# Patient Record
Sex: Male | Born: 1940 | Race: White | Hispanic: No | Marital: Married | Smoking: Current some day smoker
Health system: Southern US, Community
[De-identification: ages and names within clinical notes are randomized; demographics above are authoritative.]

## PROBLEM LIST (undated history)

## (undated) DIAGNOSIS — Z720 Tobacco use: Secondary | ICD-10-CM

## (undated) DIAGNOSIS — I2510 Atherosclerotic heart disease of native coronary artery without angina pectoris: Secondary | ICD-10-CM

## (undated) DIAGNOSIS — I214 Non-ST elevation (NSTEMI) myocardial infarction: Secondary | ICD-10-CM

## (undated) DIAGNOSIS — Z91041 Radiographic dye allergy status: Secondary | ICD-10-CM

## (undated) DIAGNOSIS — I4892 Unspecified atrial flutter: Secondary | ICD-10-CM

## (undated) DIAGNOSIS — IMO0002 Reserved for concepts with insufficient information to code with codable children: Secondary | ICD-10-CM

## (undated) DIAGNOSIS — I739 Peripheral vascular disease, unspecified: Secondary | ICD-10-CM

## (undated) DIAGNOSIS — I4891 Unspecified atrial fibrillation: Secondary | ICD-10-CM

## (undated) DIAGNOSIS — M1990 Unspecified osteoarthritis, unspecified site: Secondary | ICD-10-CM

## (undated) DIAGNOSIS — N183 Chronic kidney disease, stage 3 unspecified: Secondary | ICD-10-CM

## (undated) DIAGNOSIS — E785 Hyperlipidemia, unspecified: Secondary | ICD-10-CM

## (undated) DIAGNOSIS — I6529 Occlusion and stenosis of unspecified carotid artery: Secondary | ICD-10-CM

## (undated) DIAGNOSIS — R001 Bradycardia, unspecified: Secondary | ICD-10-CM

## (undated) DIAGNOSIS — R0789 Other chest pain: Secondary | ICD-10-CM

## (undated) DIAGNOSIS — I4510 Unspecified right bundle-branch block: Secondary | ICD-10-CM

## (undated) DIAGNOSIS — I10 Essential (primary) hypertension: Secondary | ICD-10-CM

## (undated) DIAGNOSIS — K219 Gastro-esophageal reflux disease without esophagitis: Secondary | ICD-10-CM

## (undated) HISTORY — DX: Hyperlipidemia, unspecified: E78.5

## (undated) HISTORY — DX: Other chest pain: R07.89

## (undated) HISTORY — DX: Unspecified osteoarthritis, unspecified site: M19.90

## (undated) HISTORY — DX: Occlusion and stenosis of unspecified carotid artery: I65.29

## (undated) HISTORY — PX: BACK SURGERY: SHX140

## (undated) HISTORY — DX: Gastro-esophageal reflux disease without esophagitis: K21.9

## (undated) HISTORY — PX: ARTERIAL BYPASS SURGRY: SHX557

## (undated) HISTORY — PX: APPENDECTOMY: SHX54

## (undated) HISTORY — PX: LAPAROSCOPIC CHOLECYSTECTOMY: SUR755

## (undated) HISTORY — PX: LUMBAR DISC SURGERY: SHX700

## (undated) HISTORY — PX: TONSILLECTOMY: SUR1361

## (undated) HISTORY — DX: Peripheral vascular disease, unspecified: I73.9

## (undated) HISTORY — DX: Unspecified right bundle-branch block: I45.10

## (undated) HISTORY — DX: Essential (primary) hypertension: I10

---

## 1999-04-22 ENCOUNTER — Inpatient Hospital Stay (HOSPITAL_COMMUNITY): Admission: EM | Admit: 1999-04-22 | Discharge: 1999-04-28 | Payer: Self-pay | Admitting: Emergency Medicine

## 1999-04-22 ENCOUNTER — Encounter (INDEPENDENT_AMBULATORY_CARE_PROVIDER_SITE_OTHER): Payer: Self-pay | Admitting: *Deleted

## 1999-04-22 ENCOUNTER — Encounter: Payer: Self-pay | Admitting: Emergency Medicine

## 1999-07-07 ENCOUNTER — Emergency Department (HOSPITAL_COMMUNITY): Admission: EM | Admit: 1999-07-07 | Discharge: 1999-07-07 | Payer: Self-pay | Admitting: Emergency Medicine

## 1999-07-07 ENCOUNTER — Encounter: Payer: Self-pay | Admitting: Emergency Medicine

## 1999-07-21 ENCOUNTER — Ambulatory Visit (HOSPITAL_COMMUNITY): Admission: RE | Admit: 1999-07-21 | Discharge: 1999-07-22 | Payer: Self-pay | Admitting: General Surgery

## 1999-07-21 ENCOUNTER — Encounter: Payer: Self-pay | Admitting: General Surgery

## 1999-07-21 ENCOUNTER — Encounter (INDEPENDENT_AMBULATORY_CARE_PROVIDER_SITE_OTHER): Payer: Self-pay | Admitting: *Deleted

## 2000-10-31 ENCOUNTER — Emergency Department (HOSPITAL_COMMUNITY): Admission: EM | Admit: 2000-10-31 | Discharge: 2000-10-31 | Payer: Self-pay

## 2001-08-07 ENCOUNTER — Emergency Department (HOSPITAL_COMMUNITY): Admission: EM | Admit: 2001-08-07 | Discharge: 2001-08-08 | Payer: Self-pay | Admitting: Emergency Medicine

## 2004-03-13 ENCOUNTER — Ambulatory Visit: Payer: Self-pay | Admitting: Family Medicine

## 2004-05-13 ENCOUNTER — Ambulatory Visit: Payer: Self-pay | Admitting: Family Medicine

## 2004-05-22 ENCOUNTER — Ambulatory Visit (HOSPITAL_COMMUNITY): Admission: RE | Admit: 2004-05-22 | Discharge: 2004-05-22 | Payer: Self-pay | Admitting: Family Medicine

## 2004-05-22 ENCOUNTER — Ambulatory Visit: Payer: Self-pay | Admitting: Family Medicine

## 2004-05-29 ENCOUNTER — Ambulatory Visit: Payer: Self-pay | Admitting: Family Medicine

## 2004-08-07 ENCOUNTER — Ambulatory Visit: Payer: Self-pay | Admitting: Family Medicine

## 2004-08-28 ENCOUNTER — Ambulatory Visit: Payer: Self-pay | Admitting: Family Medicine

## 2004-09-11 ENCOUNTER — Ambulatory Visit: Payer: Self-pay | Admitting: Pulmonary Disease

## 2004-09-17 ENCOUNTER — Ambulatory Visit: Admission: RE | Admit: 2004-09-17 | Discharge: 2004-09-17 | Payer: Self-pay | Admitting: Pulmonary Disease

## 2004-09-17 ENCOUNTER — Ambulatory Visit: Payer: Self-pay | Admitting: Pulmonary Disease

## 2004-10-02 ENCOUNTER — Ambulatory Visit: Payer: Self-pay

## 2004-10-09 ENCOUNTER — Ambulatory Visit: Payer: Self-pay | Admitting: Family Medicine

## 2005-02-10 ENCOUNTER — Ambulatory Visit: Payer: Self-pay | Admitting: Family Medicine

## 2005-06-11 ENCOUNTER — Ambulatory Visit: Payer: Self-pay | Admitting: Family Medicine

## 2006-03-22 ENCOUNTER — Ambulatory Visit: Payer: Self-pay | Admitting: Family Medicine

## 2006-03-30 ENCOUNTER — Ambulatory Visit: Payer: Self-pay | Admitting: Physician Assistant

## 2006-06-04 ENCOUNTER — Ambulatory Visit: Payer: Self-pay | Admitting: Family Medicine

## 2007-11-02 ENCOUNTER — Encounter: Admission: RE | Admit: 2007-11-02 | Discharge: 2007-11-02 | Payer: Self-pay | Admitting: Cardiology

## 2007-11-29 ENCOUNTER — Ambulatory Visit (HOSPITAL_COMMUNITY): Admission: RE | Admit: 2007-11-29 | Discharge: 2007-11-29 | Payer: Self-pay | Admitting: Cardiology

## 2007-12-08 ENCOUNTER — Ambulatory Visit: Payer: Self-pay | Admitting: Vascular Surgery

## 2007-12-13 ENCOUNTER — Encounter: Payer: Self-pay | Admitting: Vascular Surgery

## 2007-12-13 ENCOUNTER — Ambulatory Visit: Payer: Self-pay | Admitting: Vascular Surgery

## 2007-12-13 ENCOUNTER — Inpatient Hospital Stay (HOSPITAL_COMMUNITY): Admission: RE | Admit: 2007-12-13 | Discharge: 2007-12-14 | Payer: Self-pay | Admitting: Vascular Surgery

## 2007-12-13 HISTORY — PX: CAROTID ENDARTERECTOMY: SUR193

## 2008-01-04 ENCOUNTER — Ambulatory Visit: Payer: Self-pay | Admitting: Vascular Surgery

## 2008-07-04 ENCOUNTER — Ambulatory Visit: Payer: Self-pay | Admitting: Vascular Surgery

## 2008-11-20 IMAGING — CR DG CHEST 2V
2 series · 2 of 2 positions shown · non-contrast
Comparison: 11/02/2007 study.

CLINICAL DATA: History given of tobacco smoking.  Preoperative
respiratory evaluation.

CHEST - 2 VIEW

[view not recorded (1 of 2)]
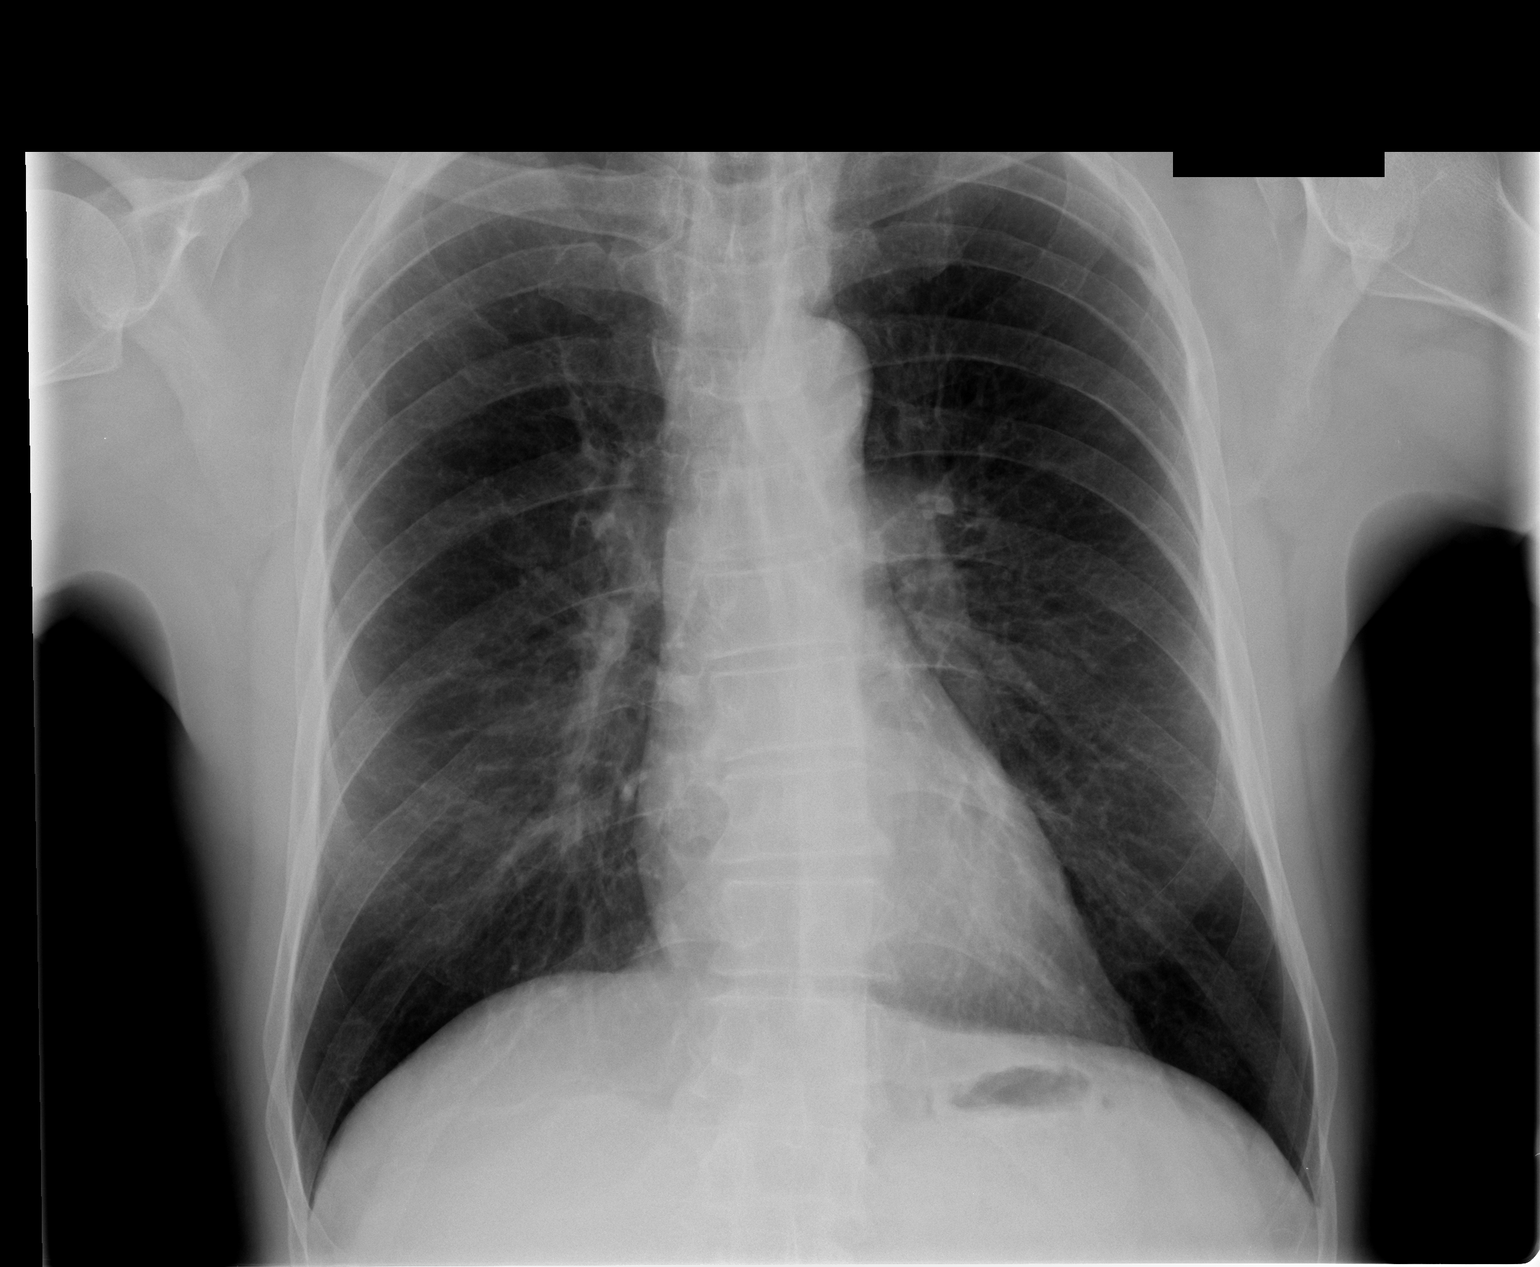

[view not recorded (2 of 2)]
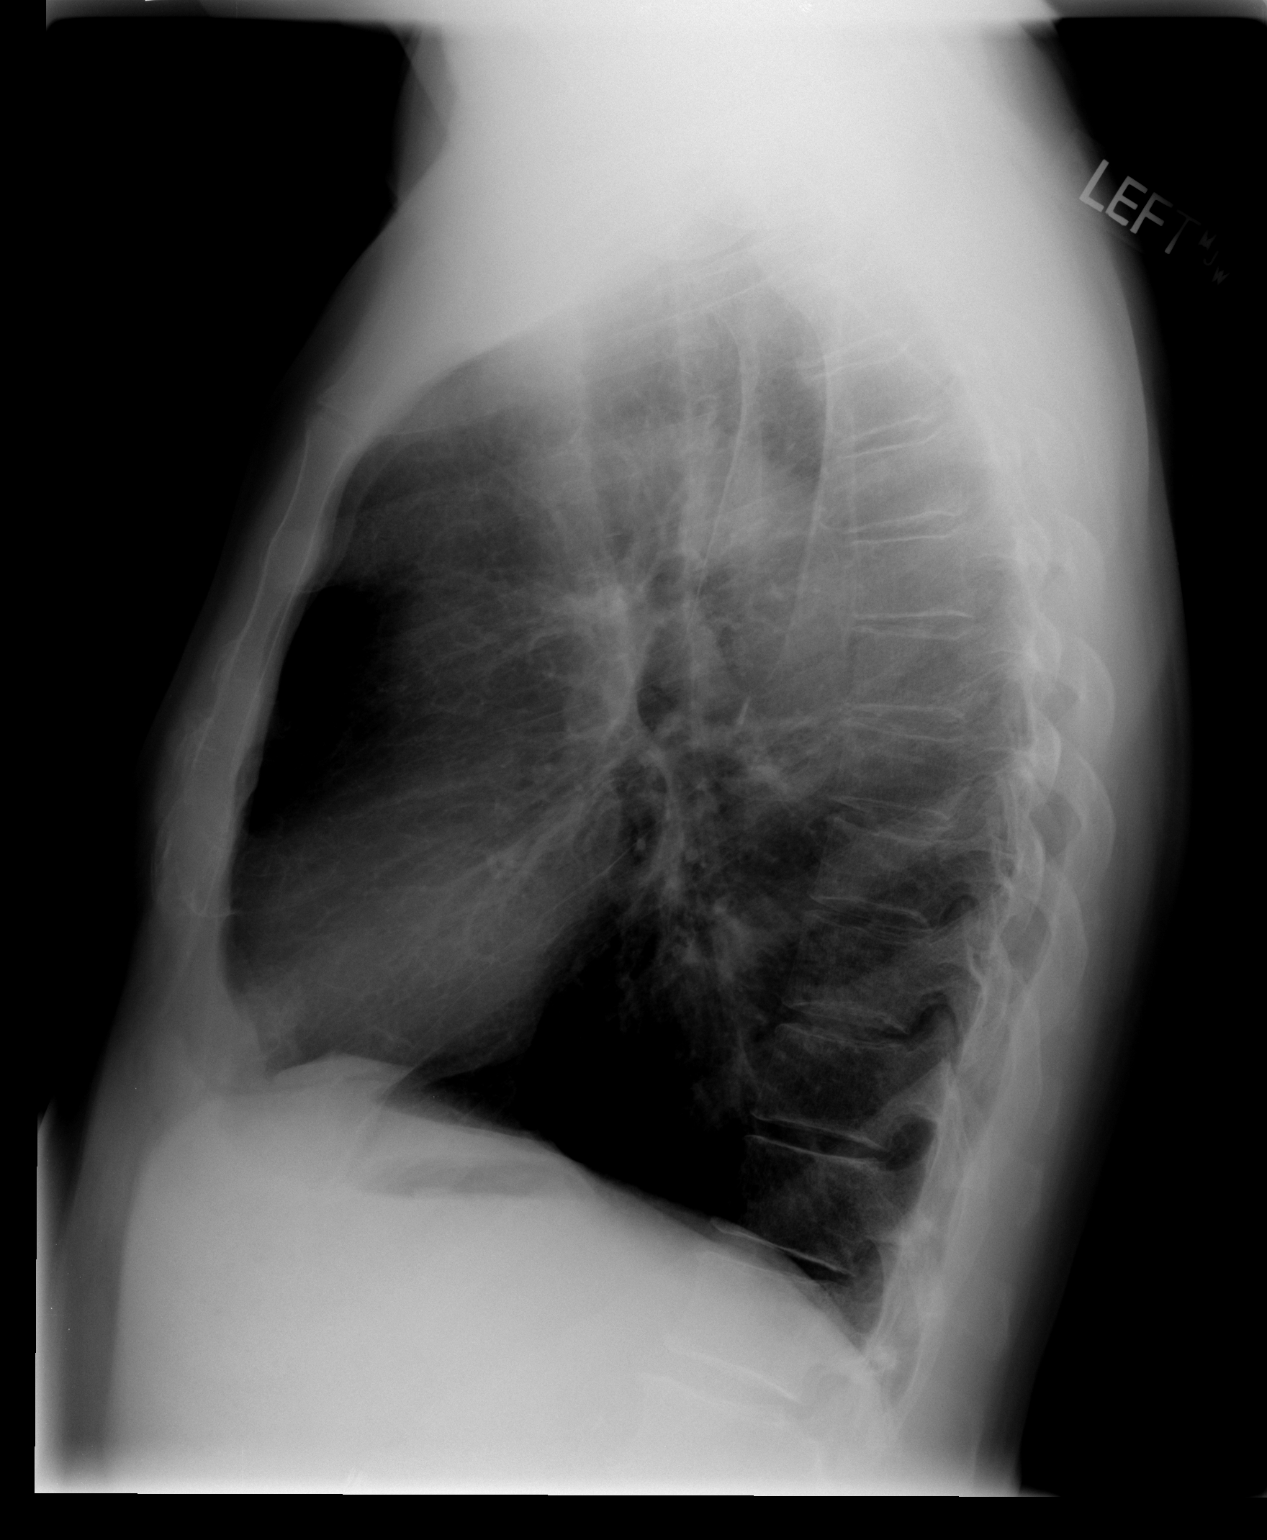

[2 of 2 positions shown; findings below may reference images not displayed]

FINDINGS: Cardiac silhouette is normal size shape.  No pleural
effusion is seen.  Mediastinum and bones appear normal.

Generalized hyperinflation configuration is seen consistent with
obstructive pulmonary disease.
IMPRESSION: Chronic hyperinflation consistent with obstructive pulmonary
disease.  No acute process seen.

## 2009-01-02 ENCOUNTER — Ambulatory Visit: Payer: Self-pay | Admitting: Vascular Surgery

## 2009-02-25 DIAGNOSIS — R0789 Other chest pain: Secondary | ICD-10-CM

## 2009-02-25 HISTORY — DX: Other chest pain: R07.89

## 2009-06-26 ENCOUNTER — Ambulatory Visit: Payer: Self-pay | Admitting: Vascular Surgery

## 2009-07-15 ENCOUNTER — Inpatient Hospital Stay (HOSPITAL_COMMUNITY): Admission: RE | Admit: 2009-07-15 | Discharge: 2009-07-16 | Payer: Self-pay | Admitting: Vascular Surgery

## 2009-07-15 ENCOUNTER — Ambulatory Visit: Payer: Self-pay | Admitting: Vascular Surgery

## 2009-07-15 ENCOUNTER — Encounter: Payer: Self-pay | Admitting: Vascular Surgery

## 2009-07-15 HISTORY — PX: CAROTID ENDARTERECTOMY: SUR193

## 2009-08-07 ENCOUNTER — Ambulatory Visit: Payer: Self-pay | Admitting: Vascular Surgery

## 2010-02-13 DIAGNOSIS — I10 Essential (primary) hypertension: Secondary | ICD-10-CM

## 2010-02-13 DIAGNOSIS — I4510 Unspecified right bundle-branch block: Secondary | ICD-10-CM

## 2010-02-13 HISTORY — DX: Essential (primary) hypertension: I10

## 2010-02-13 HISTORY — DX: Unspecified right bundle-branch block: I45.10

## 2010-04-11 ENCOUNTER — Ambulatory Visit: Payer: Self-pay | Admitting: Vascular Surgery

## 2010-07-02 LAB — BASIC METABOLIC PANEL
CO2: 22 mEq/L (ref 19–32)
Calcium: 8.3 mg/dL — ABNORMAL LOW (ref 8.4–10.5)
Creatinine, Ser: 1.58 mg/dL — ABNORMAL HIGH (ref 0.4–1.5)
GFR calc Af Amer: 53 mL/min — ABNORMAL LOW (ref >60)
GFR calc non Af Amer: 44 mL/min — ABNORMAL LOW (ref >60)
Glucose, Bld: 174 mg/dL — ABNORMAL HIGH (ref 70–99)
Sodium: 132 mEq/L — ABNORMAL LOW (ref 135–145)

## 2010-07-02 LAB — CBC
Hemoglobin: 12.8 g/dL — ABNORMAL LOW (ref 13.0–17.0)
MCHC: 34.6 g/dL (ref 30.0–36.0)
RBC: 3.88 MIL/uL — ABNORMAL LOW (ref 4.22–5.81)
RDW: 13.2 % (ref 11.5–15.5)

## 2010-07-07 LAB — CBC
Hemoglobin: 14.7 g/dL (ref 13.0–17.0)
RBC: 4.48 MIL/uL (ref 4.22–5.81)
WBC: 8.2 10*3/uL (ref 4.0–10.5)

## 2010-07-07 LAB — URINALYSIS, ROUTINE W REFLEX MICROSCOPIC
Bilirubin Urine: NEGATIVE
Glucose, UA: NEGATIVE mg/dL
Hgb urine dipstick: NEGATIVE
Ketones, ur: NEGATIVE mg/dL
pH: 5.5 (ref 5.0–8.0)

## 2010-07-07 LAB — COMPREHENSIVE METABOLIC PANEL
ALT: 19 U/L (ref 0–53)
AST: 22 U/L (ref 0–37)
Alkaline Phosphatase: 48 U/L (ref 39–117)
CO2: 24 mEq/L (ref 19–32)
Chloride: 110 mEq/L (ref 96–112)
GFR calc Af Amer: 54 mL/min — ABNORMAL LOW (ref >60)
GFR calc non Af Amer: 44 mL/min — ABNORMAL LOW (ref >60)
Glucose, Bld: 87 mg/dL (ref 70–99)
Sodium: 139 mEq/L (ref 135–145)
Total Bilirubin: 0.8 mg/dL (ref 0.3–1.2)

## 2010-07-07 LAB — TYPE AND SCREEN

## 2010-07-07 LAB — PROTIME-INR: Prothrombin Time: 13.5 seconds (ref 11.6–15.2)

## 2010-08-26 NOTE — Op Note (Signed)
NAME:  Ralph Dawson, Ralph Dawson NO.:  [id number]   MEDICAL RECORD NO.:  [id number]          PATIENT TYPE:  INP   LOCATION:  3312                         FACILITY:  [facility]   PHYSICIAN:  Janetta Hora. Fields, MD  DATE OF BIRTH:  1941-03-07   DATE OF PROCEDURE:  12/13/2007  DATE OF DISCHARGE:                               OPERATIVE REPORT   PROCEDURE:  Left carotid endarterectomy.   PREOPERATIVE DIAGNOSIS:  High-grade left internal carotid artery  stenosis.   POSTOPERATIVE DIAGNOSIS:  High-grade left internal carotid artery  stenosis.   ANESTHESIA:  General.   ASSISTANT:  Jerold Coombe, PA-C   OPERATIVE FINDINGS:  1. High-grade greater than 80% left internal carotid artery stenosis.  2. A 10-French shunt.  3. Dacron patch.   OPERATIVE DETAILS:  After obtaining informed consent, the patient was  taken to the operating.  The patient was placed in supine position on  the operating table.  After induction of general anesthesia and  endotracheal intubation, the patient's entire left neck and chest were  prepped and draped in usual sterile fashion.  An oblique incision was  made on the left side of the neck just anterior to the border of the  left sternocleidomastoid muscle.  Incision was carried down along the  anterior border of the sternocleidomastoid muscle and the internal  jugular vein was identified.  Common facial vein was dissected free  circumferentially, ligated and divided between silk ties.  Common  carotid artery was dissected free circumferentially and an umbilical  tape placed around this.  Vagus nerve was identified and protected.  Ansa cervicalis was identified and this was traced up to the level of  the hypoglossal nerve.  This was also identified and protected.  External carotid artery and superior thyroid artery were dissected free  circumferentially and vessel loops were placed around these.  The distal  internal carotid artery was dissected free  circumferentially above the  level of externally apparent disease.  A vessel loop was also placed  around this.  The patient was then given 7000 units of intravenous  heparin.  The patient was also given an additional bolus of 2000 units  of heparin during the case.  After appropriate circulation time, the  distal internal carotid artery was controlled with a vessel loop and the  common carotid artery controlled with peripheral DeBakey clamp, external  carotid artery was controlled with a vessel loop.  Longitudinal opening  was made in the common carotid artery just below the bifurcation.  This  arteriotomy was extended up through the carotid bifurcation and into the  internal carotid artery.  There was a high-grade greater than 80%  calcified plaque in this area.  A 10-French shunt was brought up in the  operative field.  This was then threaded into the distal internal  carotid artery and allowed to back bleed thoroughly.  It was then  threaded down into the common carotid artery and secured with a Rummel  tourniquet.  It was inspected and found to be free of air and the shunt  was opened with restoration of  flow to the brain after approximately 4  minutes time.  Next, endarterectomy was begun in a suitable plane  adjacent to the carotid bifurcation.  The external carotid artery was  endarterectomized by eversion technique.  Internal carotid artery  endpoint was slightly shelf-like in appearance, so this was tacked  posteriorly with several 7-0 Prolene sutures.  The remainder of all  loose debris was removed from the carotid artery.  A Dacron patch was  then brought up into the operative field and this was sewn on as a patch  angioplasty using running 6-0 Prolene suture.  Just prior to completion  of the anastomosis, the shunt was reoccluded and the distal internal  carotid artery was controlled with a vessel loop.  The distal internal  carotid artery shunt was removed and this was  allowed to back bleed  thoroughly.  This was then reoccluded.  The shunt was then removed from  the common carotid artery.  This was forebled thoroughly and then  reoccluded with a peripheral DeBakey clamp.  The external carotid artery  was thoroughly backbled and then the entire carotid was thoroughly  flushed with heparinized saline.  The remainder of the patch was  completed.  Flow was first restored by opening the external carotid  artery followed by common carotid artery and after approximately 5  cardiac cycles to the internal carotid artery.  Doppler was used to  evaluate the carotid artery and there was good flow through the  external, common, and internal carotid artery.  Next, hemostasis was  obtained with an additional repair stitch at the distal end of the  patch.  The patient was also given 50 mg of protamine.  Next, the  platysma muscle was reapproximated using running 3-0 Vicryl suture.  Skin was closed with 4-0 Vicryl subcuticular stitch.  The patient  tolerated the procedure well and there were no complications.  Instrument, sponge, and needle counts were correct at the end of the  case.  The patient was taken to the recovery room in stable condition  moving upper extremity and lower extremities symmetrically, and motor  strength.      Janetta Hora. Fields, MD  Electronically Signed     CEF/MEDQ  D:  12/13/2007  T:  12/14/2007  Job:  [id number]   cc:   Cristy Hilts. Jacinto Halim, MD  Delaney Meigs, M.D.

## 2010-08-26 NOTE — Assessment & Plan Note (Signed)
OFFICE VISIT   Ralph Dawson, Ralph Dawson  DOB:  [birth year]                                       01/02/2009  WGNFA#:[id number]   The patient is a 70 year old male who previously underwent left carotid  endarterectomy in September 2009.  He presents today for further follow-  up.  He states that he still has numbness surrounding the incision on  the left side of his neck.  He also complains that he gets occasional  dizziness, especially when standing up quickly.  He has also had some  issues with balance recently.   He denies any symptoms of TIA, amaurosis or stroke.  His primary  atherosclerotic risk factor remains tobacco abuse and he still continues  to smoke 1/2 pack daily.  Several minutes were spent discussing smoking  cessation and the benefits to this with him today.   Current medications include aspirin 325 mg once a day, Protonix 40 mg  once a day, Zocor 20 mg once a day, omega fish oil 1000 mg 2 tablets  daily.   On physical exam today, blood pressure is 138/72 in the right arm,  119/74 in the left arm, pulse is 64 regular.  HEENT is unremarkable.  Neck has 2+ carotid pulses without bruit.  Chest:  Clear to  auscultation.  Cardiac exam is regular rate and rhythm without murmur.  He has 2+ femoral pulses, 2+ radial pulses bilaterally.  Neurologic exam  shows symmetric upper extremity and lower extremity motor strength,  which is 5/5.  He has no pronator drift.   He had a carotid duplex exam today which showed a stable right-sided 60-  80% stenosis.  He had no evidence of restenosis of his left carotid  endarterectomy site.   Overall the patient is doing well.  Some of his balance and dizzy  symptoms are probably more postural-related after discussion with him.  I do not believe these are related to carotid occlusive disease.  He  does have a moderate carotid stenosis on the right side, which would  need continued surveillance.  He will continue to take  his aspirin.  He  will have a follow-up carotid duplex exam in 6 months' time.  He will  return sooner if he develops any type of focal neurologic deficits.   Janetta Hora. Fields, MD  Electronically Signed   CEF/MEDQ  D:  01/02/2009  T:  01/03/2009  Job:  [id number]   cc:   [facility]  Delaney Meigs, M.D.

## 2010-08-26 NOTE — Procedures (Signed)
CAROTID DUPLEX EXAM   INDICATION:  Followup of known carotid artery disease.   HISTORY:  Diabetes:  No.  Cardiac:  No.  Hypertension:  No.  Smoking:  Yes.  Previous Surgery:  Left carotid endarterectomy with Dacron patch  angioplasty on 12/13/2007 by Dr. Darrick Penna.  CV History:  No.  Amaurosis Fugax No, Paresthesias No, Hemiparesis No                                       RIGHT             LEFT  Brachial systolic pressure:         126               130  Brachial Doppler waveforms:         WNL               WNL  Vertebral direction of flow:        Antegrade         Antegrade  DUPLEX VELOCITIES (cm/sec)  CCA peak systolic                   126               143  ECA peak systolic                   392               189  ICA peak systolic                   365               114  ICA end diastolic                   87                38  PLAQUE MORPHOLOGY:                  Homogeneous soft  None  PLAQUE AMOUNT:                      Moderate to severe                  None  PLAQUE LOCATION:                    Bif/ICA/ECA       None   IMPRESSION:  1. High end of 60-79% right internal carotid artery stenosis.  2. Patent left internal carotid artery with no evidence of restenosis.   ___________________________________________  Janetta Hora Fields, MD   AC/MEDQ  D:  07/04/2008  T:  07/04/2008  Job:  [id number]

## 2010-08-26 NOTE — Assessment & Plan Note (Signed)
OFFICE VISIT   Ralph Dawson, Ralph Dawson  DOB:  02/18/1941                                       12/08/2007  FAOZH#:[id number]   The patient is a 70 year old male referred by Dr. Jacinto Halim for evaluation  of a high-grade internal carotid artery stenosis.  The patient had an  episode of chest pain and dizziness in May of 2009.  At that time a  stress test was performed which showed no ischemia.  He has no prior  history of stroke or TIA or amaurosis.  Further evaluation by Dr. Jacinto Halim,  including carotid arteriogram, was performed and this showed a greater  than 80% left internal carotid artery stenosis and a 60% right internal  carotid artery stenosis, left and right vertebral arteries were patent.  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  He currently denies any  claudication symptoms.   His atherosclerotic risk factors include tobacco, he currently smokes a  pack a day, he has elevated cholesterol.  He denies history of diabetes  or hypertension.  He does not have any symptoms of TIA, amaurosis or  stroke since that episode in May.   PAST MEDICAL HISTORY:  Is otherwise fairly unremarkable.   FAMILY HISTORY:  Unremarkable.   SOCIAL HISTORY:  He is married, has 4 children.   SMOKING HISTORY:  Is as above.   ALCOHOL HISTORY:  He does not drink.   REVIEW OF SYSTEMS:  He is 5 feet 8 inches, 158 pounds.  CARDIAC:  He has occasional chest tightness.  GI:  He has a history of reflux.  ORTHOPEDIC:  He has multiple joint arthritis.  PULMONARY/RENAL/VASCULAR/NEUROLOGIC/PSYCHIATRIC/ENT/HEMATOLOGIC REVIEW  OF SYSTEMS:  Are all negative.   PAST SURGICAL HISTORY:  He had a cholecystectomy and an appendectomy.   MEDICATIONS:  Aspirin 325 mg once a day, Protonix 40 mg once a day,  Zocor 20 mg once a day, Trilipix 135 mg once a day, Omega 3  capsule 1  daily.   He has no known drug allergies.   PHYSICAL EXAMINATION:  Blood pressure is 133/76 in the left arm, 138/79  in the right arm, pulse is 66 and regular.  HEENT:  Unremarkable.  He  has 2+ carotid pulses without bruit bilaterally.  Chest:  Clear to  auscultation.  Cardiac Exam:  Regular rate and rhythm without murmur.  Abdomen:  Soft, nontender, nondistended with no pulsatile mass.  Extremities:  He has 2+ radial, femoral, dorsalis pedis and posterior  tibial pulses bilaterally.  Feet are pink, warm, and well-perfused.  Neurologic Exam:  Shows symmetric upper extremity and lower extremity  motor strength.  Cranial nerves II-XII are grossly intact.   I reviewed his carotid arteriogram dated 11/28/2000.  This shows a high-  grade left internal carotid artery stenosis of greater than 80% with a  60% right internal carotid artery stenosis, the bifurcation is fairly  normal location anatomically and there is normal artery distal to the  stenosis.  The stenosis overall is fairly focal.   SUMMARY:  The patient has a high-grade left internal carotid artery  stenosis.  I believe he would benefit from left carotid endarterectomy  for stroke prophylaxis.  Currently, the lesion is asymptomatic.  I  counseled him today to continue his aspirin, I also again counseled him  to quit smoking.  We have scheduled his carotid endarterectomy for  Tuesday, September 1st.  Risks, benefits, possible complications and  procedure details were explained to the patient including, but not  limited to, bleeding, infection, stroke risk of less than 2%, cranial  nerve injury of 5-10%.  He understands and agrees to proceed.   Janetta Hora. Fields, MD  Electronically Signed   CEF/MEDQ  D:  12/09/2007  T:  12/09/2007  Job:  [id number]   cc:   Cristy Hilts. Jacinto Halim, MD  Delaney Meigs, M.D.

## 2010-08-26 NOTE — Cardiovascular Report (Signed)
NAME:  Ralph Dawson, Ralph Dawson NO.:  [id number]   MEDICAL RECORD NO.:  [id number]          PATIENT TYPE:  AMB   LOCATION:  SDS                            FACILITY:   PHYSICIAN:  Vonna Kotyk R. Jacinto Halim, MD       DATE OF BIRTH:  04/06/1941   DATE OF PROCEDURE:  11/29/2007  DATE OF DISCHARGE:                            CARDIAC CATHETERIZATION   REFERRING PHYSICIAN:  Delaney Meigs, MD.   PROCEDURE PERFORMED:  1. Arch aortogram.  2. Selective four-vessel cerebral arteriography including intracranial      and extracranial.  3. Abdominal aortogram.  4. Abdominal aortogram bifemoral runoff.   PRIMARY OPERATOR:  Cristy Hilts. Jacinto Halim, MD   ASSISTANT:  Nanetta Batty, MD   INDICATIONS:  Ralph Dawson is a 70 year old gentleman with history  of left arm tingling and numbness that occurred about a month ago, had  been complaining of chest discomfort at that time.  His cardiac risk  factors include smoking.  He also has mixed hyperlipidemia.  He had  undergone outpatient lower extremity and carotid Dopplers and this had  revealed high-grade stenosis of the left internal carotid artery about  90% with a peak velocity of 406/171 cm/sec.  Given this, he was brought  to the peripheral angiography suite to confirm the degree of stenosis.  Abdominal aortogram and bifemoral runoff were also performed to evaluate  for abdominal atherosclerosis and peripheral arterial disease with  suspicion for bilateral superficial femoral artery stenosis and right  common iliac artery stenosis.   Arch aortogram:  Arch aortogram revealed type 2 arch.  The cerebral  vessels arose in a normal fashion.   Right vertebral artery:  The right vertebral artery is very small and  nondominant.  It is normal.   Right carotid artery:  Right carotid artery showed a focal 60% stenosis  just at the bulb of the right common carotid artery.  The stenosis did  not involve either the internal or external carotid artery.   The right external and internal carotid artery were widely patent.   The intracranial portion of the right internal carotid artery and the  intracranial vessels were normal without any aneurysmal dilatation or  any significant atherosclerotic changes.   Left carotid artery:  Left common carotid artery was smooth and normal.  The left internal carotid artery showed a focal 80-90% stenosis.  Left  external iliac artery was patent and normal.   The intracranial left carotid arterial system was normal without any  significant atherosclerotic changes or aneurysmal dilatation.   Left vertebral artery:  The left vertebral artery showed mildly diffuse  luminal irregularity about 10-20%.  Otherwise, it is smooth and normal  at the base and also intracranial vasculature was within normal limits.  This was a dominant vessel and filled the contralateral vertebral.   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  The right renal artery showed a 30-40%  stenosis.  The infrarenal abdominal aorta showed mild-to-moderate amount  of atherosclerotic changes.  Distal abdominal aorta also showed mild  atherosclerotic  change.   Bilateral iliac arteriogram with distal runoff:  The distal abdominal  aortogram with bifemoral runoff revealed the right common iliac artery  to show a 40% luminal irregularity.  Otherwise, the right lower  extremity runoff was within normal limits with mild disease up to the  pelvis.   The right superficial femoral artery showed a focal 80% stenosis and  mild diffuse luminal irregularity with a three-vessel runoff below the  right knee.   On the left side, the left common iliac artery showed a 30% stenosis.  The left superficial femoral artery showed mild diffuse luminal  irregularity.  Below the left knee, there was a three-vessel runoff.   IMPRESSION:  1. A 60% stenosis in the right common carotid artery at the  bulb.  2. High-grade 80-90% stenosis of the left internal carotid artery at      its origin.  3. Mild luminal irregularity of the left vertebral artery, which is      dominant.  4. Moderate amount of abdominal atherosclerosis infrarenally with a 30-      40% right renal artery stenosis.  5. A 30-40% stenosis of the common iliac artery stenosis.  6. An 80% right superficial femoral artery stenosis, asymptomatic and      a 50-60% left superficial femoral artery stenosis in the mid      segment with three-vessel runoff below the knee bilaterally.   RECOMMENDATIONS:  We will continue with aggressive risk modification for  his peripheral arterial disease.  He will need left carotid  endarterectomy given high-grade nature of his internal carotid artery  stenosis for primary prevention of stroke.   TECHNIQUE OF THE PROCEDURE:  Under usual sterile precaution using a 5-  French right femoral arterial access, a 5-French pigtail catheter were  advanced into the arch of the aorta and arch aortogram was performed.  Then using JB1 5-French catheter, selective four-vessel cerebral  angiography was performed.  The catheter was then pulled out of the body  and a pigtail catheter was re-advanced back into the abdominal aorta and  abdominal aortogram was performed.  Pelvic aortogram with bifemoral  runoff was also performed.  The catheter was then pulled out of the  body.  The patient tolerated the procedure well.  No immediate  complications.      Cristy Hilts. Jacinto Halim, MD  Electronically Signed     JRG/MEDQ  D:  11/29/2007  T:  11/30/2007  Job:  [id number]   cc:   Delaney Meigs, M.D.

## 2010-08-26 NOTE — Procedures (Signed)
CAROTID DUPLEX EXAM   INDICATION:  Follow up carotid artery disease, status post right carotid  endarterectomy.   HISTORY:  Diabetes:  No.  Cardiac:  No.  Hypertension:  Yes.  Smoking:  Yes.  Previous Surgery:  A left carotid endarterectomy on 12/13/2007.  A right  carotid endarterectomy on 07/15/2009.  CV History:  Patient complained of left lower extremity numbness.  Amaurosis Fugax No, Paresthesias No, Hemiparesis No.                                       RIGHT             LEFT  Brachial systolic pressure:         100               108  Brachial Doppler waveforms:         WNL               WNL  Vertebral direction of flow:        Antegrade         Antegrade  DUPLEX VELOCITIES (cm/sec)  CCA peak systolic                   100               87  ECA peak systolic                   162               134  ICA peak systolic                   86                68  ICA end diastolic                   17                25  PLAQUE MORPHOLOGY:  PLAQUE AMOUNT:  PLAQUE LOCATION:   IMPRESSION:  1. Bilaterally, no hemodynamically significant stenosis.  2. Bilateral patent carotid endarterectomies.  3. Right-sided external carotid artery stenosis.   ___________________________________________  Janetta Hora Darrick Penna, MD   OD/MEDQ  D:  04/11/2010  T:  04/11/2010  Job:  [id number]

## 2010-08-26 NOTE — Assessment & Plan Note (Signed)
OFFICE VISIT   ANDRIEL, OMALLEY  DOB:  1940/10/21                                       01/04/2008  ZOXWR#:[id number]   The patient returns for followup today after his left carotid  endarterectomy on September 1.  He had an uneventful postoperative  recovery.  He still has some peri incisional numbness but overall is  well.  His swallow is intact.  He has no upper extremity or lower  extremity weakness or numbness.  Tongue is midline.  Left neck incision  is well healed.  Blood pressure today is 130/74 in the left arm and  142/79 in the right arm, pulse is 60 and regular.   Overall the patient is doing well.  Unfortunately, continues to smoke  about 10 cigarettes per day and I have counseled him that he should quit  totally.  He is continuing to take his aspirin daily.  He will follow up  in 6 months' time for repeat carotid duplex exam.   Janetta Hora. Fields, MD  Electronically Signed   CEF/MEDQ  D:  01/04/2008  T:  01/05/2008  Job:  [id number]   cc:   Cristy Hilts. Jacinto Halim, MD  Delaney Meigs, M.D.

## 2010-08-26 NOTE — Discharge Summary (Signed)
NAME:  Ralph Dawson, Ralph Dawson NO.:  [id number]   MEDICAL RECORD NO.:  [id number]          PATIENT TYPE:  INP   LOCATION:  3312                         FACILITY:  [facility]   PHYSICIAN:  Janetta Hora. Fields, MD  DATE OF BIRTH:  May 22, 1940   DATE OF ADMISSION:  12/13/2007  DATE OF DISCHARGE:  12/14/2007                               DISCHARGE SUMMARY   ADMISSION DIAGNOSIS:  High-grade left internal carotid artery stenosis,  asymptomatic.   SECONDARY DIAGNOSES:  1. High-grade left internal carotid artery stenosis, asymptomatic      status post left carotid endarterectomy.  2. Mild postoperative anemia.  3. Mild postoperative hypokalemia.  4. Dyslipidemia.  5. Ongoing tobacco use.  6. History of cholecystectomy and appendectomy.  7. No known drug allergies.  8. Mild postoperative hypotension.  9. 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  10.Peripheral vascular disease, currently asymptomatic with 40% common      iliac artery stenosis, 80% right superficial femoral artery      stenosis, and 50% left superficial femoral artery stenosis.  11.Mild renal insufficiency with baseline creatinine of 1.48.   PROCEDURE:  December 13, 2007, left carotid endarterectomy with Dacron  patch angioplasty by Dr. Fabienne Bruns.   BRIEF HISTORY:  Ralph Dawson is a 70 year old Caucasian male referred to  Dr. Darrick Penna by Dr. Yates Decamp for evaluation of high-grade internal  carotid artery stenosis.  He had had some chest pain and dizziness in  May 2009 and at that time, had a stress test, which showed no ischemia.  He also underwent a carotid arteriogram, showing greater than 80% left  internal carotid artery stenosis and 60% right internal carotid artery  stenosis.  Left and right vertebral arteries were patent.  He also had  40% right renal artery stenosis, and 40% common iliac artery stenosis,  and 80% right superficial femoral artery stenosis, and 50% left  superficial femoral artery stenosis.  He had  no claudication.  He was  also asymptomatic of his carotid artery disease.  Dr. Darrick Penna recommended  that he undergo elective left carotid endarterectomy to reduce his risk  for stroke.  He was also counseled on smoking cessation.   HOSPITAL COURSE:  On December 13, 2007, Ralph Dawson was electively  admitted to [facility], underwent a previously mentioned  procedure.  He was extubated and neurologically intact.  While in  recovery unit, he did have some hypotension with systolic blood pressure  in the mid 80s; this was treated with Hespan and a liter of normal  saline.  Following that, his pressures remained primarily in the mid 90s  up to around 120 systolically.  He maintained sinus rhythm with a rate  in the 60s.  From recovery unit, he was transferred to Step-Down Unit  3300 where he remained until discharge.  Morning labs were stable,  although showing a drop in his hemoglobin and hematocrit to 10.6 and  30.4 respectively from 15.1 and 44.5 respectively.  His neck incision  showed no signs of hematoma and it was felt the drop was some from acute  blood loss in surgery,  but mostly from hemodilution.  He reported he was  ambulating without difficulty and voiding as well following Foley  catheter removal.  He had no dysphagia, nausea, or vomiting.  Other labs  showed a white count of 8.1, platelet count of 189, sodium of 135,  potassium 3.4, which had standing potassium supplementation orders, BUN  of 9, creatinine of 1.46 with baseline creatinine of 1.48 and blood  glucose of 124.  On postop day #1, Ralph Dawson was able to tolerate food  without difficulty and was comfortable, remained neurologically intact,  and felt appropriate for discharge home.  At the time of dictation, he  remains in stable condition.   DISCHARGE MEDICATIONS:  1. Aspirin 325 mg daily.  2. Protonix 40 mg daily.  3. Zocor 20 mg q.p.m.  4. Trilipix 1 p.o. q.p.m.  5. Fish oil capsule 500 mg 2 daily.  6.  Percocet 5/325 mg 1-2 tablets p.o. q.4 h. p.r.n. pain.   DISCHARGE INSTRUCTIONS:  Ralph Dawson may shower and clean the incision  with soap and water.  Call if he develops fever greater than 101,  redness, or drainage from the incision sites, or he has severe headache  or neurologic changes.  He may increase activity slowly.  Avoid driving  or heavy lifting for the next 2 weeks and see Dr. Darrick Penna in 2-3 weeks.  Our office will contact him regarding specific appointment, date, and  time.  He is given information on enrolling in a smoking cessation class  with his discharge instructions.      Jerold Coombe, P.A.      Janetta Hora. Fields, MD  Electronically Signed    AWZ/MEDQ  D:  12/14/2007  T:  12/14/2007  Job:  [id number]   cc:   Cristy Hilts. Jacinto Halim, MD  Delaney Meigs, M.D.

## 2010-08-26 NOTE — Assessment & Plan Note (Signed)
OFFICE VISIT   Ralph Dawson, Ralph Dawson  DOB:  1941-02-01                                       08/07/2009  VHQIO#:[id number]   The patient returns for followup today.  He underwent right carotid  endarterectomy on July 15, 2009.  He had an uneventful postoperative  recovery and was discharged to home on postoperative day #1.  He returns  to the office today for further followup.  He complains today of some  numbness and tingling over the left anterolateral aspect of his left  thigh.  He also complains of some fullness and difficulty in his  temporomandibular joint bilaterally.  He reports no numbness or tingling  around the neck.  He reports no motor deficits.  He has had no symptoms  of amaurosis.  He has continued to take his aspirin.   On physical exam today, blood pressure is 149/80 in the right arm and  135/82 in the left arm.  Heart rate 66 and regular.  Temperature is 98.  Right neck incision is well healed.  He has no actual popping or  clicking with opening his jaw, but states that with certain foods he  feels like things are not quite right.  He has no obvious malocclusion.  He also has a small punctate comedone in the previous left neck incision  and wished to have a dermatologist evaluate this.  We have referred him  for this.  Neck exam shows no carotid bruits.  He does still has some  numbness around the left side incision.  This is at baseline.  Lower  extremities are 5/5 motor.  Upper extremities are 5/5 motor.  Tongue is  midline.  He does have an area of patchy numbness subjectively on the  anterolateral aspect of the left leg; however, with light touch, he  noticed no difference.  On exam, today he noticed some difference with  sharp touch.   In summary, the patient is recovering from his right carotid  endarterectomy.  He does have some numbness of the left anterolateral  thigh.  I am not sure that this is related to his carotid  endarterectomy  and, hopefully, this will resolve with time.  He had no back pain or  other evidence of nerve compression.  The sensation seems to be intact  at least to light touch but is sort of a nuisance to him.  I do not  believe this needs further workup at this point and, hopefully, it will  resolve with conservative management.  We also did schedule him for  dermatology appointment to evaluate the comedone on the left side of his  neck.  He will follow up with [place] in 6 months' time for repeat carotid  duplex exam.  He will follow up sooner if he has any difficulties.     Janetta Hora. Fields, MD  Electronically Signed   CEF/MEDQ  D:  08/07/2009  T:  08/08/2009  Job:  [id number]   cc:   Delaney Meigs, M.D.  Nicki Guadalajara, M.D.

## 2010-08-26 NOTE — Procedures (Signed)
CAROTID DUPLEX EXAM   INDICATION:  Follow up carotid artery disease.  Patient states sometimes  unbalanced when walking for approximately 3 years.   HISTORY:  Diabetes:  No  Cardiac:  No  Hypertension:  No  Smoking:  Yes  Previous Surgery:  Left CEA with DPA December 13, 2007, by Dr. Darrick Penna  CV History:  Possible TIAs approximately 10-12 years ago  Amaurosis Fugax No, Paresthesias No, Hemiparesis No                                       RIGHT               LEFT  Brachial systolic pressure:         138                 130  Brachial Doppler waveforms:         WNL                 WNL  Vertebral direction of flow:        Antegrade           Antegrade  DUPLEX VELOCITIES (cm/sec)  CCA peak systolic                   M = 81, D = 282     121  ECA peak systolic                   286                 153  ICA peak systolic                   353                 107  ICA end diastolic                   77                  48  PLAQUE MORPHOLOGY:                  Mixed  PLAQUE AMOUNT:                      Moderate/severe     None  PLAQUE LOCATION:                    Bifurcation/ICA/ECA   IMPRESSION:  1. Right ICA shows evidence of 60% to 79% stenosis and appears stable.  2. Right CCA stenosis distally with velocities of 282 cm/s extending      into the proximal ICA and ECA.  3. Right ECA stenosis.  4. Left ICA shows no evidence of restenosis status post CEA.        ___________________________________________  Janetta Hora. Fields, MD   AS/MEDQ  D:  01/02/2009  T:  01/02/2009  Job:  [id number]

## 2010-08-26 NOTE — Assessment & Plan Note (Signed)
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  He is a 70 year old gentleman who  had undergone prior left carotid endarterectomy by Dr. Darrick Penna for high-  grade asymptomatic disease in September of 2009.  He had a known  moderate right carotid stenosis and has remained asymptomatic from this.  On his last visit with Dr. Darrick Penna in September of 2010 he did complain  of some dizziness that seemed to be orthostatic.  He was in our office  today for a 6 month carotid followup.  He reports that he had been in a  hurry rushing to get over to our office and that he quickly had noted  once he got here to have some substernal pressure and some dizziness.  This completely resolved while he was in our office and he had no focal  findings.  He specifically does not have any history of deficits on one  side or the other, any visual changes or speech changes.  He reports  that the symptom sensation that he has with tightness can be relieved  with burping.   PAST MEDICAL HISTORY:  His past history is negative for myocardial  infarction.  He does have elevated blood pressure and elevated  cholesterol.   FAMILY HISTORY:  Negative for premature atherosclerotic disease.   SOCIAL HISTORY:  He is married, retired with four children.  He  continues to smoke one pack of cigarettes per day.  He does not drink  alcohol.   REVIEW OF SYSTEMS:  Positive only for chest tightness, shortness of  breath with exertion.  PULMONARY:  Negative.  GI:  For reflux.  GU:  For urinary frequency.  NEUROLOGIC:  Only for dizziness.  No focal changes.  MUSCULOSKELETAL:  For arthritis and joint pains.  PSYCHIATRIC:  Negative.  HEENT:  Negative.   PHYSICAL EXAMINATION:  General:  A well-developed, well-nourished white  male appearing his stated age.  Vital signs:  Blood pressure is 111/60,  pulse 86, respirations 18.  He is in no acute distress.  HEENT:  Is  normal.  He has a well-healed left neck incision with no bruit.  He does  have a high-pitched bruit on the right neck.  Cardiovascular:  Heart  regular rate and rhythm.  He has 2+ radial pulses bilaterally.  Chest:  Lungs are clear bilaterally.  Musculoskeletal:  No major deformities or  cyanosis.  Neurological:  No focal weakness or paresthesias.  Skin:  Without ulcers or rashes.   He did undergo repeat carotid duplex in our office today and this  reveals progression to severe level of stenosis in the right internal  carotid artery.  He has no significant restenosis in the left carotid  system.  I discussed this at length with the patient and also discussed  with Dr. Nicki Guadalajara on the telephone.  He had had an essentially  normal Cardiolite study in November of 2010 with no history of cardiac  disease and a normal ejection fraction.  I discussed the significance of  his severe progression of asymptomatic carotid stenosis with the patient  and recommended elective repair.  Since he has not had any focal  deficits I explained that this was not emergent but would recommend  elective carotid endarterectomy as soon as possible at his convenience.  He understands this and wishes to discuss it further with his wife for  scheduling and will notify us as soon as possible regarding scheduling  for an elective right carotid endarterectomy with Dr. Darrick Penna at [facility].  He understands that this will be an admission day  surgery and understands the 1-2% risk of stroke with surgery.     Larina Earthly, M.D.  Electronically Signed   TFE/MEDQ  D:  06/26/2009  T:  06/27/2009  Job:  [id number]   cc:   Delaney Meigs, M.D.  Nicki Guadalajara, M.D.  Janetta Hora. Darrick Penna, MD

## 2010-08-29 NOTE — Discharge Summary (Signed)
[place]. [facility]  Patient:    Ralph Dawson                         MRN: [id number] Adm. Date:  [id number] Disc. Date: [id number] Attending:  Glenna Fellows Tappan Dictator:   Lorne Skeens. Hoxworth, M.D.                           Discharge Summary  DISCHARGE DIAGNOSIS: 1.  Perforated appendicitis.  OPERATION/PROCEDURE:  Laparoscopic appendectomy on April 22, 1999.  HISTORY OF PRESENT ILLNESS:  [name] is a 70 year old white male who presents with a two day history of gradually worsening lower abdominal pain, now diffuse  across his lower abdomen.  He has nausea without vomiting.  He denies fever or chills.  He has no previous history of similar pain or any chronic GI complaints.  PAST MEDICAL HISTORY:  Denies previous medical or surgical illness.  MEDICATIONS:  None.  ALLERGIES:  None.  PHYSICAL EXAMINATION:  Temperature was 98 degrees, pulse 98 and regular, respirations 22, blood pressure 110/72.  In general he is a well-developed, well-nourished white male in obvious pain.  Pertinent findings were limited to he abdomen, which revealed diffuse tenderness and guarding and rebound, with tenderness greatest in the right lower quadrant.  ADMISSION LABORATORY DATA:  WBC 16,700, hematocrit 44 per cent.  X-RAYS:  CT scan of the abdomen had been obtained by the emergency room physician which showed acute appendicitis.  HOSPITAL COURSE:  The patient was given immediate IV fluid resuscitation and broad-spectrum antibiotics and taken to the operating room.  He underwent laparoscopy with findings of a perforated appendix.  He underwent laparoscopic appendectomy and his abdomen was copiously irrigated for diffuse peritonitis. e was treated with cefotetan perioperatively and postoperatively.  His initial hospital course was marked by some persistent ileus and gradually resolving peritonitis.  The following day he was more comfortable and  ambulatory with temperature of 99 degrees.  Antibiotics were continued.  On April 15, 1999 his temperature was 99.9 degrees.  He still had moderate pain and had some persistent abdominal distention and vomiting.  He was continued on IV fluids and antibiotics. WBC at that time was increased to 18,800.  By April 25, 1999 his temperature as 99.3 degrees.  He still had continued pain, with no nausea or vomiting.  His abdomen remained moderately tender and his WBC was down to 16,000.  He was felt to have continued peritonitis and ileus and was continued with IV fluids and sips f liquids and antibiotics.  By April 26, 1999 he had had two small bowel movements. His abdomen remained somewhat firm and distended.  WBC was decreased to 10,800. He was felt to be much better on April 27, 1999, afebrile with positive bowel movements and active bowel sounds, and was advanced to a regular diet.  By April 28, 1999 he was essentially feeling well.  He had had a bowel movement.  The abdomen was soft and nontender and he remained afebrile.  IV antibiotics were discontinued.  He was discharged home at this time.  DISCHARGE MEDICATIONS: 1.  Given prescription for Keflex and Flagyl for the next five days.  DISPOSITION:  Follow-up was to be in my office in ten to 14 days.  Final pathology confirmed acute suppurative appendicitis with perforation. DD:  05/28/99 TD:  05/28/99 Job: [id number]

## 2010-08-29 NOTE — Op Note (Signed)
[place]. [facility]  Patient:    Ralph Dawson, Ralph Dawson                        MRN: [id number] Proc. Date: 07/21/99 Adm. Date:  [id number] Attending:  Glenna Fellows Tappan                           Operative Report  PREOPERATIVE DIAGNOSIS:  Symptomatic cholelithiasis.  POSTOPERATIVE DIAGNOSIS:  Symptomatic cholelithiasis.  PROCEDURE:  Laparoscopic cholecystectomy with intraoperative cholangiogram.  SURGEON:  Sharlet Salina T. Hoxworth, M.D.  ASSISTANT:  Velora Heckler, M.D.  ANESTHESIA:  General.  BRIEF HISTORY:  [name] is a 70 year old white male who presents after a severe episode of epigastric and substernal pain radiating to his back.  He was  evaluated in the emergency room which included abdominal ultrasound showing multiple small gallstones, and a thickened gallbladder wall.  LFTs are normal.  Laparoscopic cholecystectomy with cholangiogram has been recommended and accepted.  The nature of the procedure, its indications, risks of bleeding, infection, bile leak, and ______ were discussed and understood preoperatively.  He is now brought to the operating room for this procedure.  DESCRIPTION OF PROCEDURE:  The patient was brought to the operating room and placed in supine position on operating table and general endotracheal anesthesia was induced.  ______ were in place.  The abdomen was sterilely prepped and draped. Antibiotics were given preoperatively.  Local anesthesia was used to infiltrate the trocar sites prior to the incision.  A 1 cm incision was made at the umbilicus nd dissection carried down to midline fascia.  This was sharply incised for 1 cm and the peritoneum entered under direct vision.  Through a mattress suture of 0 Vicryl the Hasson trocar was placed and pneumoperitoneum established.  Under direct vision a 10 mm trocar was placed in the subxiphoid area and a two 5 mm trocars along the right subcostal margin.   There  were some omental adhesions of the gallbladder that were taken down with a scissor cautery.  The fundus was grasped and elevated up over the lumen and ______ retracted inferolaterally.  The gallbladder was not acutely inflamed. Fibrofatty tissue was ______ off the neck of the gallbladder toward the porta hepatitis. he distal gallbladder and Calots triangle was thoroughly dissected and cystic duct  gallbladder junction dissected free in a 60 degrees.  When the anatomy was clear the cystic duct was clipped at the gallbladder junction and operative cholangiogram obtained through the cystic duct.  This showed good filling of a normal sized common bile duct and intrahepatic duct with free flow into the duodenum and no filling defects.  Following this the cholangiocath was removed and the cystic duct was doubly clipped proximally and divided.  The cystic artery coursing up under the gallbladder wall was divided between clips.  The small posterior branch of the cystic artery was also controlled with clips and the gallbladder was dissected ree from its bed using hook and spatula cautery and removed intact through the umbilicus.  Clean hemostasis was assured in the operative site.  Trocars removed under direct vision.  All the CO2 evacuated from the peritoneal cavity.  The pursestring suture was secured in the umbilicus.  Skin incisions were closed with interrupted subcuticular 4-0 monacryl and steri-strips.  Sponge, needle and instrument counts were correct.  Clean sterile dressings were applied and the patient taken to recovery in good condition. DD:  07/21/99 TD:  07/22/99 Job: [id number]

## 2010-08-29 NOTE — Op Note (Signed)
[place]. [facility]  Patient:    Ralph Dawson                         MRN: [id number] Proc. Date: 04/22/99 Adm. Date:  [id number] Attending:  Glenna Fellows Tappan                           Operative Report  PREOPERATIVE DIAGNOSIS:  Acute appendicitis.  POSTOPERATIVE DIAGNOSIS:  Perforated appendicitis.  SURGICAL PROCEDURE:  Laparoscopic appendectomy.  SURGEON:  Lorne Skeens. Hoxworth, M.D.  ANESTHESIA:  General.  BRIEF HISTORY:  [name] is a 70 year old white male who presents with two days of worsening diffuse lower abdominal pain.  CT scan shows appendicitis.  He has  diffuse tenderness and elevated white count.  Laparoscopic and possibly open appendectomy have been recommended and accepted.  The nature of the procedure, ts indications and risks of bleeding and infection were discussed and understood. He is now brought to the operating room for the procedure.  DESCRIPTION OF OPERATION:  The patient was brought to the operating room, placed in supine position on the operating table and general endotracheal anesthesia was induced.  PAS were in place.  He had been given broad-spectrum antibiotics intravenously.  The abdomen was sterilely prepped and draped.  Trocar sites were infiltrated with local anesthesia.  A 1-cm incision was made at the umbilicus and dissection carried down to the midline fascia.  This was sharply incised for 1 m and the peritoneum entered under direct vision.  A mattress suture of 0 Vicryl as placed and the Hasson trocar inserted and pneumoperitoneum established.  A video laparoscopy revealed diffuse peritonitis with cloudy-greenish fluid in the peritoneum.  Under direct vision, a 5-mm trocar was placed in the right upper quadrant and a 12-mm trocar in the left upper quadrant.  The terminal ileum and  cecum were exposed and the cecum rotated medially, exposing the appendix, which was seen to be perforated near  its base with acute appendicitis.  The terminal ileum and cecum were mobilized medially, dividing lateral peritoneal attachments, better exposing the appendix and its base.  The appendix was then elevated and the mesoappendix was mobilized.  The first portion of mesoappendix was divided with a single firing of the Endo GIA 3.5-mm stapler.  Further portions of the mesoappendix down near the base of the appendix were sequentially dissected free, clipped proximally and divided until the appendix was completely freed down to its base. At this point, it could be seen that the perforation was about 0.5 or 1.0 cm distal to the base of the appendix, with a reasonable-appearing base for a staple line. The Endo GIA 3.5-mm stapler was then used to fire across the base of the appendix. The staple line looked fine and intact.  No bleeding.  The appendix was then placed in an EndoCatch bag and removed through the umbilicus.  Following this, the abdomen was irrigated with several liters of saline until the irrigation was clear. The operative site was again inspected for hemostasis, which was complete.  Trocars  were removed under direct vision and all CO2 evacuated.  The pursestring suture was secured at the umbilicus and the fascial defect in the left lower quadrant was closed with a couple of sutures of 0 Vicryl.  Skin was closed with interrupted subcuticular 4-0 Vicryl and Steri-Strips.  Sponge, needle and instrument counts  were correct.  Dry sterile dressing was  applied and patient taken to recovery room in good condition. DD:  04/22/99 TD:  04/23/99 Job: [id number]

## 2010-09-18 ENCOUNTER — Other Ambulatory Visit (INDEPENDENT_AMBULATORY_CARE_PROVIDER_SITE_OTHER): Payer: BC Managed Care – PPO

## 2010-09-18 DIAGNOSIS — Z48812 Encounter for surgical aftercare following surgery on the circulatory system: Secondary | ICD-10-CM

## 2010-09-18 DIAGNOSIS — I6529 Occlusion and stenosis of unspecified carotid artery: Secondary | ICD-10-CM

## 2010-10-03 NOTE — Procedures (Unsigned)
CAROTID DUPLEX EXAM  INDICATION:  Carotid artery disease.  HISTORY: Diabetes:  No. Cardiac:  No. Hypertension:  Yes. Smoking:  Yes. Previous Surgery:  Left carotid endarterectomy, 12/13/2007.  Right carotid endarterectomy, 07/15/2009. CV History:  Currently asymptomatic. Amaurosis Fugax No, Paresthesias No, Hemiparesis No.                                      RIGHT             LEFT Brachial systolic pressure:         107               110 Brachial Doppler waveforms:         Normal            Normal Vertebral direction of flow:        Antegrade         Antegrade DUPLEX VELOCITIES (cm/sec) CCA peak systolic                   92                95 ECA peak systolic                   159               111 ICA peak systolic                   75                70 ICA end diastolic                   27                27 PLAQUE MORPHOLOGY: PLAQUE AMOUNT:                      None              None PLAQUE LOCATION:  IMPRESSION: 1. Patent bilateral carotid endarterectomy sites with no evidence of     restenosis of either internal carotid artery. 2. Right external carotid artery stenosis. 3. Stable from the previous examination.  ___________________________________________ Janetta Hora Fields, MD  EM/MEDQ  D:  09/18/2010  T:  09/18/2010  Job:  [id number]

## 2011-01-14 LAB — BASIC METABOLIC PANEL
Chloride: 106
Creatinine, Ser: 1.46
GFR calc Af Amer: 58 — ABNORMAL LOW
Sodium: 135

## 2011-01-14 LAB — CBC
Hemoglobin: 10.6 — ABNORMAL LOW
MCV: 93
RBC: 3.27 — ABNORMAL LOW
WBC: 8.1

## 2011-06-17 DIAGNOSIS — I739 Peripheral vascular disease, unspecified: Secondary | ICD-10-CM

## 2011-06-17 HISTORY — DX: Peripheral vascular disease, unspecified: I73.9

## 2011-09-14 ENCOUNTER — Encounter: Payer: Self-pay | Admitting: Vascular Surgery

## 2011-09-23 ENCOUNTER — Encounter: Payer: Self-pay | Admitting: Neurosurgery

## 2011-09-24 ENCOUNTER — Ambulatory Visit (INDEPENDENT_AMBULATORY_CARE_PROVIDER_SITE_OTHER): Payer: BC Managed Care – PPO | Admitting: Neurosurgery

## 2011-09-24 ENCOUNTER — Ambulatory Visit (INDEPENDENT_AMBULATORY_CARE_PROVIDER_SITE_OTHER): Payer: BC Managed Care – PPO | Admitting: Vascular Surgery

## 2011-09-24 ENCOUNTER — Encounter: Payer: Self-pay | Admitting: Neurosurgery

## 2011-09-24 VITALS — BP 142/78 | HR 69 | Resp 14 | Ht 68.0 in | Wt 164.6 lb

## 2011-09-24 DIAGNOSIS — I6529 Occlusion and stenosis of unspecified carotid artery: Secondary | ICD-10-CM

## 2011-09-24 DIAGNOSIS — Z48812 Encounter for surgical aftercare following surgery on the circulatory system: Secondary | ICD-10-CM

## 2011-09-24 HISTORY — DX: Occlusion and stenosis of unspecified carotid artery: I65.29

## 2011-09-24 NOTE — Progress Notes (Signed)
[facility] [place] HISTORY AND PHYSICAL   CC: Annual carotid duplex Referring Physician: Fields  History of Present Illness: 71 year old male patient of Dr. Darrick Penna status post a left CEA in September 2009, right CEA in April 2011. Patient denies any signs or symptoms of CVA, TIA, amaurosis fugax or any neural deficit. Patient denies any new medical diagnoses or recent surgeries.  Past Medical History  Diagnosis Date  . Carotid artery occlusion   . GERD (gastroesophageal reflux disease)   . Hypertension   . Hyperlipidemia   . Arthritis     ROS: [x]  Positive   [ ]  Denies    General: [ ]  Weight loss, [ ]  Fever, [ ]  chills Neurologic: [ ]  Dizziness, [ ]  Blackouts, [ ]  Seizure [ ]  Stroke, [ ]  "Mini stroke", [ ]  Slurred speech, [ ]  Temporary blindness; [ ]  weakness in arms or legs, [ ]  Hoarseness Cardiac: [ ]  Chest pain/pressure, [ ]  Shortness of breath at rest [ ]  Shortness of breath with exertion, [ ]  Atrial fibrillation or irregular heartbeat Vascular: [ ]  Pain in legs with walking, [ ]  Pain in legs at rest, [ ]  Pain in legs at night,  [ ]  Non-healing ulcer, [ ]  Blood clot in vein/DVT,   Pulmonary: [ ]  Home oxygen, [ ]  Productive cough, [ ]  Coughing up blood, [ ]  Asthma,  [ ]  Wheezing Musculoskeletal:  [ ]  Arthritis, [ ]  Low back pain, [ ]  Joint pain Hematologic: [ ]  Easy Bruising, [ ]  Anemia; [ ]  Hepatitis Gastrointestinal: [ ]  Blood in stool, [ ]  Gastroesophageal Reflux/heartburn, [ ]  Trouble swallowing Urinary: [ ]  chronic Kidney disease, [ ]  on HD - [ ]  MWF or [ ]  TTHS, [ ]  Burning with urination, [ ]  Difficulty urinating Skin: [ ]  Rashes, [ ]  Wounds Psychological: [ ]  Anxiety, [ ]  Depression   Social History History  Substance Use Topics  . Smoking status: Current Everyday Smoker -- 1.0 packs/day  . Smokeless tobacco: Not on file  . Alcohol Use: No    Family History No family history on file.  No Known Allergies  Current Outpatient  Prescriptions  Medication Sig Dispense Refill  . aspirin 81 MG tablet Take 81 mg by mouth daily.      . fenofibrate 160 MG tablet Take 160 mg by mouth daily.      . Garlic 1000 MG CAPS Take by mouth 2 (two) times daily.      . isosorbide dinitrate (ISORDIL) 20 MG tablet Take 20 mg by mouth 3 (three) times daily.      Marland Kitchen lisinopril (PRINIVIL,ZESTRIL) 10 MG tablet Take 10 mg by mouth daily.      . OMEGA 3 1000 MG CAPS Take by mouth.      . pantoprazole (PROTONIX) 40 MG tablet Take 40 mg by mouth daily.      . simvastatin (ZOCOR) 80 MG tablet Take 80 mg by mouth at bedtime.      . vitamin C (ASCORBIC ACID) 500 MG tablet Take 500 mg by mouth daily.        Physical Examination  Filed Vitals:   09/24/11 1051  BP: 142/78  Pulse: 69  Resp:     Body mass index is 25.03 kg/(m^2).  General:  WDWN in NAD Gait: Normal HEENT: WNL Eyes: Pupils equal Pulmonary: normal non-labored breathing , without Rales, rhonchi,  wheezing Cardiac: RRR, without  Murmurs, rubs or gallops; Abdomen: soft, NT, no masses Skin: no rashes, ulcers  noted  Vascular Exam Pulses: 2+ radial pulses Carotid bruits: Carotid pulses to auscultation no bruits are heard Extremities without ischemic changes, no Gangrene , no cellulitis; no open wounds;  Musculoskeletal: no muscle wasting or atrophy   Neurologic: A&O X 3; Appropriate Affect ; SENSATION: normal; MOTOR FUNCTION:  moving all extremities equally. Speech is fluent/normal  Non-Invasive Vascular Imaging CAROTID DUPLEX 09/24/2011  Right ICA 20 - 39 % stenosis Left ICA 0 - 19% stenosis   ASSESSMENT/PLAN: Asymptomatic patient status post bilateral CEA. Patient knows the signs and symptoms of stroke and knows to report to the nearest emergency department should that occur. His questions were encouraged and answered. He will followup here in one year with repeat carotid duplex and be seen in my clinic  Lauree Chandler ANP   Clinic MD: Darrick Penna

## 2011-09-24 NOTE — Addendum Note (Signed)
Addended by: Sharee Pimple on: 09/24/2011 12:13 PM   Modules accepted: Orders

## 2011-09-25 NOTE — Procedures (Unsigned)
CAROTID DUPLEX EXAM  INDICATION:  Carotid stenosis.  HISTORY: Diabetes:  No Cardiac:  No Hypertension:  Yes Smoking:  Currently Previous Surgery:  Left carotid endarterectomy 12/13/2007; right carotid endarterectomy 07/15/2009. CV History:  Left lower extremity numbness and tingling, no upper extremity numbness or weakness. Amaurosis Fugax No, Paresthesias Yes, Hemiparesis No                                      RIGHT             LEFT Brachial systolic pressure:         148               150 Brachial Doppler waveforms:         WNL               WNL Vertebral direction of flow:        Antegrade         Antegrade DUPLEX VELOCITIES (cm/sec) CCA peak systolic                   105               96 ECA peak systolic                   145               129 ICA peak systolic                   103               76 ICA end diastolic                   37                24 PLAQUE MORPHOLOGY:                  Heterogenous      Not visualized PLAQUE AMOUNT:                      Mild              Not visualized PLAQUE LOCATION:                    CCA/ICA           Not visualized  IMPRESSION:  Right internal carotid artery is patent with history of endarterectomy, mild heterogenous plaque present at mid endarterectomy site without hemodynamic changes, however, suggesting stenosis in the 1% to 39% range. Bilateral external carotid arteries appear patent. Left internal carotid artery is patent with history of endarterectomy, no hyperplasia or hemodynamically significant plaque is evident. Bilateral vertebral arteries are patent and antegrade. Minimal changes present since previous study on 09/18/2010.  ___________________________________________ Janetta Hora. Fields, MD  SH/MEDQ  D:  09/24/2011  T:  09/24/2011  Job:  [id number]

## 2011-11-11 ENCOUNTER — Ambulatory Visit
Admission: RE | Admit: 2011-11-11 | Discharge: 2011-11-11 | Disposition: A | Discharge status: Home / Self Care | Payer: BC Managed Care – PPO | Source: Ambulatory Visit | Attending: Family Medicine | Admitting: Family Medicine

## 2011-11-11 ENCOUNTER — Ambulatory Visit
Admission: RE | Admit: 2011-11-11 | Discharge: 2011-11-11 | Disposition: A | Discharge status: Home / Self Care | Payer: BC Managed Care – PPO | Source: Ambulatory Visit | Attending: Cardiovascular Disease | Admitting: Cardiovascular Disease

## 2011-11-11 ENCOUNTER — Other Ambulatory Visit: Payer: Self-pay | Admitting: Cardiovascular Disease

## 2011-11-11 ENCOUNTER — Other Ambulatory Visit: Payer: Self-pay | Admitting: Family Medicine

## 2011-11-11 DIAGNOSIS — R079 Chest pain, unspecified: Secondary | ICD-10-CM

## 2011-11-11 DIAGNOSIS — R52 Pain, unspecified: Secondary | ICD-10-CM

## 2012-06-12 ENCOUNTER — Encounter: Payer: Self-pay | Admitting: Pharmacist Clinician (PhC)/ Clinical Pharmacy Specialist

## 2012-07-07 ENCOUNTER — Encounter: Payer: Self-pay | Admitting: Cardiovascular Disease

## 2012-08-16 ENCOUNTER — Other Ambulatory Visit (HOSPITAL_COMMUNITY): Payer: Self-pay | Admitting: Cardiovascular Disease

## 2012-08-16 DIAGNOSIS — I739 Peripheral vascular disease, unspecified: Secondary | ICD-10-CM

## 2012-08-30 ENCOUNTER — Ambulatory Visit (HOSPITAL_COMMUNITY)
Admission: RE | Admit: 2012-08-30 | Discharge: 2012-08-30 | Disposition: A | Discharge status: Home / Self Care | Payer: BC Managed Care – PPO | Source: Ambulatory Visit | Attending: Internal Medicine | Admitting: Internal Medicine

## 2012-08-30 DIAGNOSIS — I739 Peripheral vascular disease, unspecified: Secondary | ICD-10-CM | POA: Insufficient documentation

## 2012-08-30 DIAGNOSIS — I70219 Atherosclerosis of native arteries of extremities with intermittent claudication, unspecified extremity: Secondary | ICD-10-CM

## 2012-08-30 NOTE — Progress Notes (Signed)
Lower Extremity Arterial Duplex Completed. °Ralph Dawson ° °

## 2012-09-08 ENCOUNTER — Telehealth: Payer: Self-pay | Admitting: *Deleted

## 2012-09-08 DIAGNOSIS — I739 Peripheral vascular disease, unspecified: Secondary | ICD-10-CM

## 2012-09-08 NOTE — Telephone Encounter (Signed)
Message copied by Marella Bile on Thu Sep 08, 2012 12:48 PM ------      Message from: Runell Gess      Created: Mon Sep 05, 2012  6:50 PM       No change from prior study. Repeat in 12 months. ------

## 2012-09-08 NOTE — Telephone Encounter (Signed)
Recall placed for lower extremity dopplers

## 2012-09-22 ENCOUNTER — Ambulatory Visit: Payer: BC Managed Care – PPO | Admitting: Neurosurgery

## 2012-09-22 ENCOUNTER — Other Ambulatory Visit: Payer: BC Managed Care – PPO

## 2012-10-03 ENCOUNTER — Other Ambulatory Visit (INDEPENDENT_AMBULATORY_CARE_PROVIDER_SITE_OTHER): Payer: BC Managed Care – PPO | Admitting: *Deleted

## 2012-10-03 DIAGNOSIS — I6529 Occlusion and stenosis of unspecified carotid artery: Secondary | ICD-10-CM

## 2012-10-03 DIAGNOSIS — Z48812 Encounter for surgical aftercare following surgery on the circulatory system: Secondary | ICD-10-CM

## 2012-10-04 ENCOUNTER — Other Ambulatory Visit: Payer: Self-pay

## 2012-10-04 DIAGNOSIS — Z48812 Encounter for surgical aftercare following surgery on the circulatory system: Secondary | ICD-10-CM

## 2012-10-05 ENCOUNTER — Encounter: Payer: Self-pay | Admitting: Surgery

## 2012-11-07 ENCOUNTER — Other Ambulatory Visit: Payer: Self-pay | Admitting: *Deleted

## 2012-11-07 MED ORDER — LISINOPRIL 10 MG PO TABS: 10.0000 mg | ORAL_TABLET | Freq: Every day | ORAL | Status: DC

## 2013-01-24 ENCOUNTER — Encounter: Payer: Self-pay | Admitting: Cardiovascular Disease

## 2013-01-24 ENCOUNTER — Ambulatory Visit (INDEPENDENT_AMBULATORY_CARE_PROVIDER_SITE_OTHER): Payer: BC Managed Care – PPO | Admitting: Cardiovascular Disease

## 2013-01-24 VITALS — BP 128/68 | HR 67 | Ht 68.0 in | Wt 165.4 lb

## 2013-01-24 DIAGNOSIS — I10 Essential (primary) hypertension: Secondary | ICD-10-CM

## 2013-01-24 DIAGNOSIS — E785 Hyperlipidemia, unspecified: Secondary | ICD-10-CM

## 2013-01-24 NOTE — Progress Notes (Signed)
Patient ID: Ralph Dawson, male   DOB: 1940/07/30, 72 y.o.   MRN: [id number]     HPI: Ralph Dawson, is a 72 y.o. male presents to the office today for one-year cardiology evaluation  [name] has documented peripheral vascular disease. In September 2009 he underwent left carotid endarterectomy. In April 2011 underwent right carotid endarterectomy. He has a history of Lotrimin the intermittent claudication, hyperlipidemia, as well as mild valvular heart disease with mild mitral annular calcification with mild MR, mild TR, and mild aortic valve sclerosis with normal systolic and diastolic function. Nuclear perfusion study November 2011 showed normal perfusion. He does have some GERD symptoms in the past which have improved with pantoprazole. Recently, he was started on pravastatin 80 mg by Dr. Lysbeth Galas in he states his most recent LDL cholesterol is markedly improved and was 65. He does have documented right bundle branch block. He presents for one-year cardiology evaluation.  He denies recent chest pain. He denies palpitations. There is a long-standing tobacco history.  Past Medical History  Diagnosis Date  . Carotid artery occlusion   . GERD (gastroesophageal reflux disease)   . Hypertension 02/13/2010    echo - EF >55%; mild mitral annular calcification; mild aortic valve sclerosis  . Hyperlipidemia   . Arthritis   . RBBB (right bundle branch block) 02/13/2010    R/P MV - EF 67%; normal perfusion all regions; no significant wall abnormalties noted  . Carotid stenosis 09/24/2011    R ICA patent w/ hx of endarterectomy, stenosis 1-39%;  L ICA patent w/ hx of endarterectomy; external carotids appear patent; see imaging tab for full report  . Claudication 06/17/2011    LE doppler - bilateral ABIs normal values at rest; R CIA >50% diameter reduction L CIA 0-49% reduction; bilateral SFAs mild/mod mixed density plaque throughout suggesting 50-69% diameter reduction  . Chest heaviness 02/25/2009   PVCs, atrial bigeminy    Past Surgical History  Procedure Laterality Date  . Carotid endarterectomy  07-15-2009    Right CEA  . Carotid endarterectomy  12-13-2007    Left CEA  . Appendectomy    . Arterial bypass surgry      No Known Allergies  Current Outpatient Prescriptions  Medication Sig Dispense Refill  . aspirin 81 MG tablet Take 81 mg by mouth daily.      . DULoxetine (CYMBALTA) 60 MG capsule Take 60 mg by mouth daily.      . isosorbide mononitrate (IMDUR) 30 MG 24 hr tablet Take 30 mg by mouth Daily.      Marland Kitchen lisinopril (PRINIVIL,ZESTRIL) 10 MG tablet Take 1 tablet (10 mg total) by mouth daily.  30 tablet  2  . pravastatin (PRAVACHOL) 80 MG tablet Take 80 mg by mouth daily.      . tamsulosin (FLOMAX) 0.4 MG CAPS Take 0.4 mg by mouth daily.       No current facility-administered medications for this visit.    History   Social History  . Marital Status: Married    Spouse Name: N/A    Number of Children: N/A  . Years of Education: N/A   Occupational History  . Not on file.   Social History Main Topics  . Smoking status: Current Every Day Smoker -- 0.50 packs/day    Types: Cigarettes  . Smokeless tobacco: Never Used  . Alcohol Use: No  . Drug Use: No  . Sexual Activity: Not on file   Other Topics Concern  . Not on file  Social History Narrative  . No narrative on file    Family History  Problem Relation Age of Onset  . Cancer Mother     stomach  . Cancer Sister   . Diabetes Brother   . Cancer Daughter     ROS is negative for fevers, chills or night sweats. He denies skin changes. He denies visual symptoms. He denies paresthesias. There is no wheezing. He does note some very mild shortness of breath intermittently. He denies tachycardia palpitations. There is no chest pressure. He denies presyncope or syncope. He denies change in bowel or bladder habits. He denies recent GERD symptoms. His cholesterol was elevated but this had apparently remarkably  improved since being treated with Pravachol 80 mg. He denies claudication symptoms. He denies edema. He denies paresthesias.   Other comprehensive 12 point system review is negative.  PE BP 128/68  Pulse 67  Ht 5\' 8"  (1.727 m)  Wt 165 lb 6.4 oz (75.025 kg)  BMI 25.15 kg/m2  General: Alert, oriented, no distress.  Skin: normal turgor, no rashes HEENT: Normocephalic, atraumatic. Pupils round and reactive; sclera anicteric;no lid lag.  Nose without nasal septal hypertrophy Mouth/Parynx benign; Mallinpatti scale 3 Neck: Bilateral carotid endarterectomy No JVD, no carotid briuts Lungs: Reduced breath sounds due to loss any tobacco use.; no wheezing or rales Heart: RRR, s1 s2 normal 1/6 systolic murmur. Abdomen: soft, nontender; no hepatosplenomehaly, BS+; abdominal aorta nontender and not dilated by palpation. Pulses 2+ Extremities: no clubbing cyanosis or edema, Homan's sign negative  Neurologic: grossly nonfocal Psychological: Intact mood and affect.  ECG: Sinus rhythm with right bundle branch block at 67 beats per minute.  LABS:  BMET    Component Value Date/Time   NA 132* 07/16/2009 0411   K 3.7 07/16/2009 0411   CL 104 07/16/2009 0411   CO2 22 07/16/2009 0411   GLUCOSE 174* 07/16/2009 0411   BUN 18 07/16/2009 0411   CREATININE 1.58* 07/16/2009 0411   CALCIUM 8.3* 07/16/2009 0411   GFRNONAA 44* 07/16/2009 0411   GFRAA  Value: 53        The eGFR has been calculated using the MDRD equation. This calculation has not been validated in all clinical situations. eGFR's persistently <60 mL/min signify possible Chronic Kidney Disease.* 07/16/2009 0411     Hepatic Function Panel     Component Value Date/Time   PROT 6.6 07/10/2009 1052   ALBUMIN 3.9 07/10/2009 1052   AST 22 07/10/2009 1052   ALT 19 07/10/2009 1052   ALKPHOS 48 07/10/2009 1052   BILITOT 0.8 07/10/2009 1052     CBC    Component Value Date/Time   WBC 18.0* 07/16/2009 0411   RBC 3.88* 07/16/2009 0411   HGB 12.8* 07/16/2009 0411   HCT  37.0* 07/16/2009 0411   PLT 197 07/16/2009 0411   MCV 95.3 07/16/2009 0411   MCHC 34.6 07/16/2009 0411   RDW 13.2 07/16/2009 0411     BNP No results found for this basename: probnp    Lipid Panel  No results found for this basename: chol, trig, hdl, cholhdl, vldl, ldlcalc     RADIOLOGY: No results found.    ASSESSMENT AND PLAN: Mr. Dartanyan Deasis is a 72 year old gentleman who is status post bilateral carotid endarterectomies in 2009 and in 2011. A nuclear perfusion study done in November 2011 showed normal perfusion. Recently, his lipids were elevated and following institution of trauma call 80 mg apparently his LDL cholesterol has improved to 65 on most recent testing.  His blood pressure is well controlled on his current dose of lisinopril 10 mg.  He continues to be active. His blood pressure is well controlled. He is not having any presyncope or syncope. He has chronic right bundle branch block.  He sees Dr. Lysbeth Galas regularly. I will see him in one year followup evaluation.      Lennette Bihari, MD, [facility]  01/24/2013 3:10 PM

## 2013-01-24 NOTE — Patient Instructions (Signed)
Your physician wants you to follow-up in 12 MONTHS DR KELLY .You will receive a reminder letter in the mail two months in advance. If you don't receive a letter, please call our office to schedule the follow-up appointment.   CONTINUE WITH CURRENT MEDICATION

## 2013-02-23 ENCOUNTER — Other Ambulatory Visit: Payer: Self-pay | Admitting: Cardiovascular Disease

## 2013-02-23 NOTE — Telephone Encounter (Signed)
Rx was sent to pharmacy electronically. 

## 2013-05-26 ENCOUNTER — Other Ambulatory Visit: Payer: Self-pay | Admitting: Vascular Surgery

## 2013-05-26 DIAGNOSIS — Z48812 Encounter for surgical aftercare following surgery on the circulatory system: Secondary | ICD-10-CM

## 2013-05-26 DIAGNOSIS — I6529 Occlusion and stenosis of unspecified carotid artery: Secondary | ICD-10-CM

## 2013-08-08 ENCOUNTER — Telehealth (HOSPITAL_COMMUNITY): Payer: Self-pay | Admitting: *Deleted

## 2013-08-16 ENCOUNTER — Telehealth (HOSPITAL_COMMUNITY): Payer: Self-pay | Admitting: *Deleted

## 2013-08-25 ENCOUNTER — Telehealth (HOSPITAL_COMMUNITY): Payer: Self-pay | Admitting: *Deleted

## 2013-08-31 ENCOUNTER — Encounter (HOSPITAL_COMMUNITY): Payer: BC Managed Care – PPO

## 2013-09-06 ENCOUNTER — Ambulatory Visit (HOSPITAL_COMMUNITY)
Admission: RE | Admit: 2013-09-06 | Discharge: 2013-09-06 | Disposition: A | Discharge status: Home / Self Care | Payer: BC Managed Care – PPO | Source: Ambulatory Visit | Attending: Internal Medicine | Admitting: Internal Medicine

## 2013-09-06 DIAGNOSIS — I739 Peripheral vascular disease, unspecified: Secondary | ICD-10-CM

## 2013-09-06 NOTE — Progress Notes (Signed)
Lower Extremity Arterial Duplex Completed. °Brianna L Mazza,RVT °

## 2013-09-11 ENCOUNTER — Telehealth: Payer: Self-pay | Admitting: *Deleted

## 2013-09-11 ENCOUNTER — Encounter: Payer: Self-pay | Admitting: *Deleted

## 2013-09-11 ENCOUNTER — Other Ambulatory Visit: Payer: Self-pay | Admitting: *Deleted

## 2013-09-11 DIAGNOSIS — I6529 Occlusion and stenosis of unspecified carotid artery: Secondary | ICD-10-CM

## 2013-09-11 DIAGNOSIS — I739 Peripheral vascular disease, unspecified: Secondary | ICD-10-CM

## 2013-09-11 NOTE — Telephone Encounter (Signed)
Message copied by Tobin Chad on Mon Sep 11, 2013  1:22 PM ------      Message from: Runell Gess      Created: Sun Sep 10, 2013  4:37 PM       No change from prior study. Repeat in 12 months. ------

## 2013-09-11 NOTE — Telephone Encounter (Signed)
Mailed letter for results ORDER RECALL DONE.

## 2013-10-05 ENCOUNTER — Ambulatory Visit: Payer: BC Managed Care – PPO | Admitting: Family

## 2013-10-05 ENCOUNTER — Other Ambulatory Visit (HOSPITAL_COMMUNITY): Payer: BC Managed Care – PPO

## 2014-01-09 ENCOUNTER — Other Ambulatory Visit: Payer: Self-pay | Admitting: Cardiovascular Disease

## 2014-01-09 NOTE — Telephone Encounter (Signed)
Rx was sent to pharmacy electronically. 

## 2014-01-26 ENCOUNTER — Other Ambulatory Visit: Payer: Self-pay | Admitting: *Deleted

## 2014-01-26 ENCOUNTER — Ambulatory Visit (INDEPENDENT_AMBULATORY_CARE_PROVIDER_SITE_OTHER): Payer: BC Managed Care – PPO | Admitting: Cardiovascular Disease

## 2014-01-26 ENCOUNTER — Encounter: Payer: Self-pay | Admitting: Cardiovascular Disease

## 2014-01-26 VITALS — BP 136/68 | HR 70 | Ht 68.0 in | Wt 171.6 lb

## 2014-01-26 DIAGNOSIS — R0602 Shortness of breath: Secondary | ICD-10-CM

## 2014-01-26 DIAGNOSIS — I10 Essential (primary) hypertension: Secondary | ICD-10-CM

## 2014-01-26 DIAGNOSIS — E785 Hyperlipidemia, unspecified: Secondary | ICD-10-CM

## 2014-01-26 DIAGNOSIS — I6523 Occlusion and stenosis of bilateral carotid arteries: Secondary | ICD-10-CM

## 2014-01-26 DIAGNOSIS — I4510 Unspecified right bundle-branch block: Secondary | ICD-10-CM

## 2014-01-26 NOTE — Progress Notes (Addendum)
Patient ID: Ralph Dawson, male   DOB: 1941-01-06, 73 y.o.   MRN: [id number]     HPI: Ralph Dawson is a 73 y.o. male presents to the office today for one-year cardiology evaluation  Ralph Dawson has documented peripheral vascular disease. In September 2009 he underwent left carotid endarterectomy and in April 2011 underwent right carotid endarterectomy. He has a history of lower exstremity intermittent claudication, hyperlipidemia, as well as mild valvular heart disease with mild mitral annular calcification with mild MR, mild TR, and mild aortic valve sclerosis with normal systolic and diastolic function. Nuclear perfusion study November 2011 showed normal perfusion. He has had GERD symptoms which have improved with pantoprazole. He was started on pravastatin 80 mg by Dr. Edrick Oh and last year hisLDL cholesterol  was 65. He does have documented right bundle branch block.  He tells me he underwent a complete set of blood work partially 2 months ago by Dr. Edrick Oh.  I will obtain these results for my review.    He tells me possibly 6 months ago, when he was evaluated in [place], he also had carotid studies performed.  These have also been reviewed by Dr. Oneida Alar who performed his last vascular surgery.  He denies recent chest pain. He denies palpitations. There is a long-standing tobacco history. He has noticed some mild shortness of breath, particularly with significant activity.  Yesterday, he was helping his neighbor furniture and did note some significant shortness of breath.  However, he denies shortness of breath with routine activity.  Past Medical History  Diagnosis Date  . Carotid artery occlusion   . GERD (gastroesophageal reflux disease)   . Hypertension 02/13/2010    echo - EF >55%; mild mitral annular calcification; mild aortic valve sclerosis  . Hyperlipidemia   . Arthritis   . RBBB (right bundle branch block) 02/13/2010    R/P MV - EF 67%; normal perfusion all regions; no  significant wall abnormalties noted  . Carotid stenosis 09/24/2011    R ICA patent w/ hx of endarterectomy, stenosis 1-39%;  L ICA patent w/ hx of endarterectomy; external carotids appear patent; see imaging tab for full report  . Claudication 06/17/2011    LE doppler - bilateral ABIs normal values at rest; R CIA >50% diameter reduction L CIA 0-49% reduction; bilateral SFAs mild/mod mixed density plaque throughout suggesting 50-69% diameter reduction  . Chest heaviness 02/25/2009    PVCs, atrial bigeminy    Past Surgical History  Procedure Laterality Date  . Carotid endarterectomy  07-15-2009    Right CEA  . Carotid endarterectomy  12-13-2007    Left CEA  . Appendectomy    . Arterial bypass surgry      No Known Allergies  Current Outpatient Prescriptions  Medication Sig Dispense Refill  . aspirin 81 MG tablet Take 81 mg by mouth daily.      . DULoxetine (CYMBALTA) 60 MG capsule Take 60 mg by mouth daily.      . isosorbide mononitrate (IMDUR) 30 MG 24 hr tablet TAKE 1 TABLET DAILY  90 tablet  0  . pravastatin (PRAVACHOL) 80 MG tablet Take 80 mg by mouth daily.      . tamsulosin (FLOMAX) 0.4 MG CAPS Take 0.4 mg by mouth daily.       No current facility-administered medications for this visit.    History   Social History  . Marital Status: Married    Spouse Name: N/A    Number of Children: N/A  . Years  of Education: N/A   Occupational History  . Not on file.   Social History Main Topics  . Smoking status: Current Every Day Smoker -- 0.50 packs/day    Types: Cigarettes  . Smokeless tobacco: Never Used  . Alcohol Use: No  . Drug Use: No  . Sexual Activity: Not on file   Other Topics Concern  . Not on file   Social History Narrative  . No narrative on file    Family History  Problem Relation Age of Onset  . Cancer Mother     stomach  . Cancer Sister   . Diabetes Brother   . Cancer Daughter    ROS General: Negative; No fevers, chills, or night sweats;  HEENT:  Negative; No changes in vision or hearing, sinus congestion, difficulty swallowing Pulmonary: Negative; No cough, wheezing, shortness of breath, hemoptysis Cardiovascular: Negative; No chest pain, presyncope, syncope, palpitations GI: Positive for GERD; No nausea, vomiting, diarrhea, or abdominal pain GU: Negative; No dysuria, hematuria, or difficulty voiding Musculoskeletal: Positive heel spur discomfort; no myalgias, or weakness Hematologic/Oncology: Negative; no easy bruising, bleeding Endocrine: Negative; no heat/cold intolerance; no diabetes Neuro: Negative; no changes in balance, headaches Skin: Negative; No rashes or skin lesions Psychiatric: Negative; No behavioral problems, depression Sleep: Negative; No snoring, daytime sleepiness, hypersomnolence, bruxism, restless legs, hypnogognic hallucinations, no cataplexy Other comprehensive 14 point system review is negative.   PE Ht '5\' 8"'  (1.727 m)  Wt 171 lb 9.6 oz (77.837 kg)  BMI 26.10 kg/m2  General: Alert, oriented, no distress.  Skin: normal turgor, no rashes HEENT: Normocephalic, atraumatic. Pupils round and reactive; sclera anicteric;no lid lag.  Nose without nasal septal hypertrophy Mouth/Parynx benign; Mallinpatti scale 3 Neck: Bilateral carotid endarterectomy No JVD, no carotid bruits Lungs: Reduced breath sounds due to loss any tobacco use.; no wheezing or rales Chest wall: Nontender to palpation Heart: RRR, s1 s2 normal 1/6 systolic murmur; no diastolic murmur.  No S3 gallop.  No rubs, thrills or heaves Abdomen: soft, nontender; no hepatosplenomehaly, BS+; abdominal aorta nontender and not dilated by palpation. Pulses 2+ Back: No CVA tenderness Extremities: no clubbing cyanosis or edema, Homan's sign negative  Neurologic: grossly nonfocal Psychological: Intact mood and affect.  ECG (independently read by me):Normal sinus rhythm at 70 beats per minute.  Right bundle branch block with repolarization changes.  Normal  intervals.  Prior ECG: Sinus rhythm with right bundle branch block at 67 beats per minute.  LABS:  BMET    Component Value Date/Time   NA 132* 07/16/2009 0411   K 3.7 07/16/2009 0411   CL 104 07/16/2009 0411   CO2 22 07/16/2009 0411   GLUCOSE 174* 07/16/2009 0411   BUN 18 07/16/2009 0411   CREATININE 1.58* 07/16/2009 0411   CALCIUM 8.3* 07/16/2009 0411   GFRNONAA 44* 07/16/2009 0411   GFRAA  Value: 53        The eGFR has been calculated using the MDRD equation. This calculation has not been validated in all clinical situations. eGFR's persistently <60 mL/min signify possible Chronic Kidney Disease.* 07/16/2009 0411     Hepatic Function Panel     Component Value Date/Time   PROT 6.6 07/10/2009 1052   ALBUMIN 3.9 07/10/2009 1052   AST 22 07/10/2009 1052   ALT 19 07/10/2009 1052   ALKPHOS 48 07/10/2009 1052   BILITOT 0.8 07/10/2009 1052     CBC    Component Value Date/Time   WBC 18.0* 07/16/2009 0411   RBC 3.88* 07/16/2009 0411  HGB 12.8* 07/16/2009 0411   HCT 37.0* 07/16/2009 0411   PLT 197 07/16/2009 0411   MCV 95.3 07/16/2009 0411   MCHC 34.6 07/16/2009 0411   RDW 13.2 07/16/2009 0411     BNP No results found for this basename: probnp    Lipid Panel  No results found for this basename: chol,  trig,  hdl,  cholhdl,  vldl,  ldlcalc     RADIOLOGY: No results found.    ASSESSMENT AND PLAN: Ralph Dawson is a 73 year old gentleman who is status post bilateral carotid endarterectomies in 2009 and in 2011. A nuclear perfusion study done in November 2011 showed normal perfusion.  He has a history of hyperlipidemia, and these have significantly improved with high-dose pravastatin therapy.  He has been on chronic isosorbide mononitrate.  He denies recent chest pain.  His last echo Doppler study in 2000 111 showed normal systolic function.  He has noticed some shortness of breath with significant activity.  I have recommended he undergo a followup echo Doppler study to reassess systolic and diastolic  function.  I also discussed with him the possibility of performing a 4 year nuclear perfusion study.  He would prefer not to do this presently.  I will try to obtain the results of the blood work done by Dr. Edrick Oh.  Target LDL is less than 70.  He developed an ACE-induced cough and no longer is on lisinopril.  His blood pressure today is stable, however, if he does have blood pressure elevation I would consider instituting ARB therapy.  I will not do this presently.  Smoking cessation is again advised. I will contact him regarding the results of his echo Doppler study and if there has been significant change.  I will see him back in followup.  Otherwise, I'll see him back in 6 months to year, for followup evaluation.   Troy Sine, MD, [facility]  01/26/2014 7:57 AM

## 2014-01-26 NOTE — Patient Instructions (Signed)
Your physician has requested that you have an echocardiogram. Echocardiography is a painless test that uses sound waves to create images of your heart. It provides your doctor with information about the size and shape of your heart and how well your heart's chambers and valves are working. This procedure takes approximately one hour. There are no restrictions for this procedure.   Your physician wants you to follow-up in: 6 months or sooner if echo is abnormal You will receive a reminder letter in the mail two months in advance. If you don't receive a letter, please call our office to schedule the follow-up appointment.

## 2014-01-30 ENCOUNTER — Ambulatory Visit (HOSPITAL_COMMUNITY): Payer: BC Managed Care – PPO

## 2014-01-30 ENCOUNTER — Telehealth (HOSPITAL_COMMUNITY): Payer: Self-pay | Admitting: *Deleted

## 2014-02-06 ENCOUNTER — Telehealth (HOSPITAL_COMMUNITY): Payer: Self-pay | Admitting: *Deleted

## 2014-02-12 ENCOUNTER — Ambulatory Visit (HOSPITAL_COMMUNITY)
Admission: RE | Admit: 2014-02-12 | Discharge: 2014-02-12 | Disposition: A | Discharge status: Home / Self Care | Payer: BC Managed Care – PPO | Source: Ambulatory Visit | Attending: Cardiovascular Disease | Admitting: Cardiovascular Disease

## 2014-02-12 DIAGNOSIS — I10 Essential (primary) hypertension: Secondary | ICD-10-CM | POA: Insufficient documentation

## 2014-02-12 DIAGNOSIS — E785 Hyperlipidemia, unspecified: Secondary | ICD-10-CM | POA: Diagnosis not present

## 2014-02-12 DIAGNOSIS — R0600 Dyspnea, unspecified: Secondary | ICD-10-CM

## 2014-02-12 DIAGNOSIS — R0602 Shortness of breath: Secondary | ICD-10-CM

## 2014-02-12 DIAGNOSIS — I517 Cardiomegaly: Secondary | ICD-10-CM

## 2014-02-12 DIAGNOSIS — Z720 Tobacco use: Secondary | ICD-10-CM | POA: Insufficient documentation

## 2014-02-12 NOTE — Progress Notes (Signed)
2D Echocardiogram Complete.  02/12/2014   Krystyn Picking, RDCS  

## 2014-02-16 ENCOUNTER — Encounter: Payer: Self-pay | Admitting: *Deleted

## 2014-03-24 ENCOUNTER — Other Ambulatory Visit: Payer: Self-pay | Admitting: Cardiovascular Disease

## 2014-03-26 NOTE — Telephone Encounter (Signed)
Rx refill sent to patient pharmacy   

## 2014-06-29 ENCOUNTER — Telehealth: Payer: Self-pay | Admitting: Cardiovascular Disease

## 2014-07-02 NOTE — Telephone Encounter (Signed)
Closed encounter °

## 2014-08-02 ENCOUNTER — Ambulatory Visit (INDEPENDENT_AMBULATORY_CARE_PROVIDER_SITE_OTHER): Payer: BLUE CROSS/BLUE SHIELD | Admitting: Cardiovascular Disease

## 2014-08-02 ENCOUNTER — Encounter: Payer: Self-pay | Admitting: Cardiovascular Disease

## 2014-08-02 VITALS — BP 142/74 | HR 67 | Ht 68.0 in | Wt 165.6 lb

## 2014-08-02 DIAGNOSIS — I4510 Unspecified right bundle-branch block: Secondary | ICD-10-CM | POA: Diagnosis not present

## 2014-08-02 DIAGNOSIS — I10 Essential (primary) hypertension: Secondary | ICD-10-CM | POA: Diagnosis not present

## 2014-08-02 DIAGNOSIS — Z720 Tobacco use: Secondary | ICD-10-CM

## 2014-08-02 DIAGNOSIS — I6523 Occlusion and stenosis of bilateral carotid arteries: Secondary | ICD-10-CM

## 2014-08-02 DIAGNOSIS — E785 Hyperlipidemia, unspecified: Secondary | ICD-10-CM

## 2014-08-02 NOTE — Patient Instructions (Signed)
Your physician wants you to follow-up in: 6 months or sooner if needed. You will receive a reminder letter in the mail two months in advance. If you don't receive a letter, please call our office to schedule the follow-up appointment. 

## 2014-08-04 ENCOUNTER — Encounter: Payer: Self-pay | Admitting: Cardiovascular Disease

## 2014-08-04 DIAGNOSIS — Z720 Tobacco use: Secondary | ICD-10-CM | POA: Insufficient documentation

## 2014-08-04 NOTE — Progress Notes (Signed)
Patient ID: Ralph Dawson, male   DOB: November 13, 1940, 74 y.o.   MRN: [id number]     HPI: Ralph Dawson is a 74 y.o. male presents to the office today for 6 month cardiology evaluation  [name] has documented PVD and underwent left carotid endarterectomy in September 2009 and in April 2011 underwent right carotid endarterectomy. He has a history of lower extremity intermittent claudication, hyperlipidemia, as well as mild valvular heart disease with mild mitral annular calcification with mild MR, mild TR, and mild aortic valve sclerosis with normal systolic and diastolic function. A nuclear perfusion study November 2011 showed normal perfusion. He has had GERD symptoms which have improved with pantoprazole. He was started on pravastatin 80 mg by Dr. Edrick Dawson and last year hisLDL cholesterol  was 65. He does have documented right bundle branch block.  He tells me he underwent a complete set of blood work partially 2 months ago by Dr. Edrick Dawson.  I will obtain these results for my review.    Since I last saw him, he denies any episodes of chest pain.  He has been bothered by heel spurs.  He denies recent chest pain. He denies palpitations. There is a long-standing tobacco history and continues to smoke, now only 8 cigarettes per day. He has noticed some mild shortness of breath, particularly with significant activity.  He has a history of GERD for which he takes protonix.  He has been taking Cymbalta for depression.  In November 2015 an echo Doppler study showed mild left ventricular hypertrophy with hyperdynamic LV function and ejection fraction of 65-70%.  There was grade 1 diastolic dysfunction.  Past Medical History  Diagnosis Date  . Carotid artery occlusion   . GERD (gastroesophageal reflux disease)   . Hypertension 02/13/2010    echo - EF >55%; mild mitral annular calcification; mild aortic valve sclerosis  . Hyperlipidemia   . Arthritis   . RBBB (right bundle branch block) 02/13/2010    R/P MV  - EF 67%; normal perfusion all regions; no significant wall abnormalties noted  . Carotid stenosis 09/24/2011    R ICA patent w/ hx of endarterectomy, stenosis 1-39%;  L ICA patent w/ hx of endarterectomy; external carotids appear patent; see imaging tab for full report  . Claudication 06/17/2011    LE doppler - bilateral ABIs normal values at rest; R CIA >50% diameter reduction L CIA 0-49% reduction; bilateral SFAs mild/mod mixed density plaque throughout suggesting 50-69% diameter reduction  . Chest heaviness 02/25/2009    PVCs, atrial bigeminy    Past Surgical History  Procedure Laterality Date  . Carotid endarterectomy  07-15-2009    Right CEA  . Carotid endarterectomy  12-13-2007    Left CEA  . Appendectomy    . Arterial bypass surgry      No Known Allergies  Current Outpatient Prescriptions  Medication Sig Dispense Refill  . aspirin 81 MG tablet Take 81 mg by mouth daily.    Marland Kitchen donepezil (ARICEPT) 5 MG tablet Take 1 tablet by mouth daily.    . DULoxetine (CYMBALTA) 60 MG capsule Take 60 mg by mouth daily.    . pantoprazole (PROTONIX) 40 MG tablet Take 1 tablet by mouth daily.    . pravastatin (PRAVACHOL) 80 MG tablet Take 80 mg by mouth daily.    . tamsulosin (FLOMAX) 0.4 MG CAPS Take 0.4 mg by mouth daily.    . isosorbide mononitrate (IMDUR) 30 MG 24 hr tablet TAKE 1 TABLET DAILY 90 tablet  3   No current facility-administered medications for this visit.    History   Social History  . Marital Status: Married    Spouse Name: N/A  . Number of Children: N/A  . Years of Education: N/A   Occupational History  . Not on file.   Social History Main Topics  . Smoking status: Current Every Day Smoker -- 0.50 packs/day    Types: Cigarettes  . Smokeless tobacco: Never Used  . Alcohol Use: No  . Drug Use: No  . Sexual Activity: Not on file   Other Topics Concern  . Not on file   Social History Narrative    Family History  Problem Relation Age of Onset  . Cancer Mother      stomach  . Cancer Sister   . Diabetes Brother   . Cancer Daughter    ROS General: Negative; No fevers, chills, or night sweats;  HEENT: Negative; No changes in vision or hearing, sinus congestion, difficulty swallowing Pulmonary: Negative; No cough, wheezing, shortness of breath, hemoptysis Cardiovascular: Negative; No chest pain, presyncope, syncope, palpitations GI: Positive for GERD; No nausea, vomiting, diarrhea, or abdominal pain GU: Negative; No dysuria, hematuria, or difficulty voiding Musculoskeletal: Positive heel spur discomfort; no myalgias, or weakness Hematologic/Oncology: Negative; no easy bruising, bleeding Endocrine: Negative; no heat/cold intolerance; no diabetes Neuro: Negative; no changes in balance, headaches Skin: Negative; No rashes or skin lesions Psychiatric: Positive for depression Sleep: Negative; No snoring, daytime sleepiness, hypersomnolence, bruxism, restless legs, hypnogognic hallucinations, no cataplexy Other comprehensive 14 point system review is negative.   PE BP 142/74 mmHg  Pulse 67  Ht '5\' 8"'  (1.727 m)  Wt 165 lb 9.6 oz (75.116 kg)  BMI 25.19 kg/m2  Wt Readings from Last 3 Encounters:  08/02/14 165 lb 9.6 oz (75.116 kg)  01/26/14 171 lb 9.6 oz (77.837 kg)  01/24/13 165 lb 6.4 oz (75.025 kg)   General: Alert, oriented, no distress.  Skin: normal turgor, no rashes HEENT: Normocephalic, atraumatic. Pupils round and reactive; sclera anicteric;no lid lag.  Nose without nasal septal hypertrophy Mouth/Parynx benign; Mallinpatti scale 3 Neck: Bilateral carotid endarterectomy No JVD, no carotid bruits Lungs: Reduced breath sounds due to loss any tobacco use.; no wheezing or rales Chest wall: Nontender to palpation Heart: RRR, s1 s2 normal 1/6 systolic murmur; no diastolic murmur.  No S3 gallop.  No rubs, thrills or heaves Abdomen: soft, nontender; no hepatosplenomehaly, BS+; abdominal aorta nontender and not dilated by palpation. Pulses  2+ Back: No CVA tenderness Extremities: no clubbing cyanosis or edema, Homan's sign negative  Neurologic: grossly nonfocal Psychological: Intact mood and affect.  ECG (independently read by me): Sinus rhythm at 67 bpm with occasional PVCs with transient trigeminal rhythm; right bundle branch block.  October 2015 ECG (independently read by me):Normal sinus rhythm at 70 beats per minute.  Right bundle branch block with repolarization changes.  Normal intervals.  Prior ECG: Sinus rhythm with right bundle branch block at 67 beats per minute.  LABS:  BMET   BMP Latest Ref Rng 07/16/2009 07/10/2009 12/14/2007  Glucose 70 - 99 mg/dL 174(H) 87 124(H)  BUN 6 - 23 mg/dL '18 22 9  ' Creatinine 0.4 - 1.5 mg/dL 1.58(H) 1.56(H) 1.46  Sodium 135 - 145 mEq/L 132(L) 139 135  Potassium 3.5 - 5.1 mEq/L 3.7 4.8 3.4(L)  Chloride 96 - 112 mEq/L 104 110 106  CO2 19 - 32 mEq/L '22 24 22  ' Calcium 8.4 - 10.5 mg/dL 8.3(L) 9.3 7.5(L)   Hepatic  Function Panel   Hepatic Function Latest Ref Rng 07/10/2009  Total Protein 6.0 - 8.3 g/dL 6.6  Albumin 3.5 - 5.2 g/dL 3.9  AST 0 - 37 U/L 22  ALT 0 - 53 U/L 19  Alk Phosphatase 39 - 117 U/L 48  Total Bilirubin 0.3 - 1.2 mg/dL 0.8    CBC  CBC Latest Ref Rng 07/16/2009 07/10/2009 12/14/2007  WBC 4.0 - 10.5 K/uL 18.0(H) 8.2 8.1  Hemoglobin 13.0 - 17.0 g/dL 12.8(L) 14.7 10.6(L)  Hematocrit 39.0 - 52.0 % 37.0(L) 42.9 30.4(L)  Platelets 150 - 400 K/uL 197 217 189   No results found for: TSH BNP No results found for: PROBNP  Lipid Panel  No results found for: CHOL   RADIOLOGY: No results found.    ASSESSMENT AND PLAN: [name] is a 74 year old gentleman who is status post bilateral carotid endarterectomies in 2009 and in 2011. A nuclear perfusion study done in November 2011 showed normal perfusion.  He has a history of hyperlipidemia, and these have significantly improved with high-dose pravastatin therapy.  He has been on chronic isosorbide mononitrate.  He  denies recent chest pain.  An echo Doppler study in November 2015 showed normal systolic function with an EF of 65-70% and grade 1 diastolic dysfunction.  I reviewed this with him in detail.  He has asymptomatic PVCs.  His blood pressure was mildly elevated 142/74, but repeat was improved at 130/70.  Remotely, he had been on ace inhibition and developed a cough.  I am uncertain as to how his most recent renal function is a remotely, he did have mild renal insufficiency.  If blood pressure continues to be mildly elevated, addition of low-dose beta blocker therapy may be helpful.  He tells me Dr. Edrick Dawson will be checking complete laboratory.  I last these be sent to me for my review.  I again discussed complete smoking cessation.  He will be undergoing lower extremity arterial duplex imaging several months.  I will see him in 6 months for reevaluation.  Troy Sine, MD, [facility]  08/04/2014 11:32 AM

## 2014-08-06 ENCOUNTER — Ambulatory Visit: Payer: Self-pay | Admitting: Cardiovascular Disease

## 2014-08-09 ENCOUNTER — Telehealth (HOSPITAL_COMMUNITY): Payer: Self-pay | Admitting: *Deleted

## 2014-09-03 ENCOUNTER — Telehealth (HOSPITAL_COMMUNITY): Payer: Self-pay | Admitting: *Deleted

## 2014-10-22 ENCOUNTER — Encounter: Payer: Self-pay | Admitting: Cardiovascular Disease

## 2014-12-25 ENCOUNTER — Other Ambulatory Visit: Payer: Self-pay | Admitting: Cardiovascular Disease

## 2014-12-25 DIAGNOSIS — I739 Peripheral vascular disease, unspecified: Secondary | ICD-10-CM

## 2014-12-27 ENCOUNTER — Ambulatory Visit (HOSPITAL_COMMUNITY)
Admission: RE | Admit: 2014-12-27 | Discharge: 2014-12-27 | Disposition: A | Discharge status: Home / Self Care | Payer: BLUE CROSS/BLUE SHIELD | Source: Ambulatory Visit | Attending: Cardiovascular Disease | Admitting: Cardiovascular Disease

## 2014-12-27 DIAGNOSIS — I739 Peripheral vascular disease, unspecified: Secondary | ICD-10-CM | POA: Diagnosis present

## 2015-02-15 ENCOUNTER — Ambulatory Visit: Payer: BLUE CROSS/BLUE SHIELD | Admitting: Cardiovascular Disease

## 2015-02-19 ENCOUNTER — Encounter: Payer: Self-pay | Admitting: Cardiovascular Disease

## 2016-04-03 ENCOUNTER — Other Ambulatory Visit: Payer: Self-pay | Admitting: Adult Health Nurse Practitioner

## 2016-04-03 ENCOUNTER — Ambulatory Visit
Admission: RE | Admit: 2016-04-03 | Discharge: 2016-04-03 | Disposition: A | Discharge status: Home / Self Care | Payer: BLUE CROSS/BLUE SHIELD | Source: Ambulatory Visit | Attending: Adult Health Nurse Practitioner | Admitting: Adult Health Nurse Practitioner

## 2016-04-03 DIAGNOSIS — M5431 Sciatica, right side: Secondary | ICD-10-CM

## 2016-04-21 ENCOUNTER — Telehealth: Payer: Self-pay | Admitting: Cardiovascular Disease

## 2016-04-21 NOTE — Telephone Encounter (Signed)
[facility] to [facility]. Routed for review after appt.

## 2016-04-21 NOTE — Telephone Encounter (Signed)
New message   appt made with APP on  1.10.2018  At [facility].   Request for surgical clearance:  What type of surgery is being performed? Lumbar surgery  When is this surgery scheduled? 1.17.2018 -pending    1. Are there any medications that need to be held prior to surgery and how long?asa 5-7 piror   2. Name of physician performing surgery? Dr. Aquilla HackerGhobrial   3. What is your office phone and fax number? [phone] /  fax [phone]

## 2016-04-22 ENCOUNTER — Ambulatory Visit (INDEPENDENT_AMBULATORY_CARE_PROVIDER_SITE_OTHER): Payer: BLUE CROSS/BLUE SHIELD | Admitting: Physician Assistant

## 2016-04-22 ENCOUNTER — Encounter: Payer: Self-pay | Admitting: Physician Assistant

## 2016-04-22 VITALS — BP 148/88 | HR 76 | Ht 68.0 in | Wt 162.4 lb

## 2016-04-22 DIAGNOSIS — Z01810 Encounter for preprocedural cardiovascular examination: Secondary | ICD-10-CM

## 2016-04-22 DIAGNOSIS — I340 Nonrheumatic mitral (valve) insufficiency: Secondary | ICD-10-CM

## 2016-04-22 DIAGNOSIS — I779 Disorder of arteries and arterioles, unspecified: Secondary | ICD-10-CM

## 2016-04-22 DIAGNOSIS — E785 Hyperlipidemia, unspecified: Secondary | ICD-10-CM

## 2016-04-22 DIAGNOSIS — I739 Peripheral vascular disease, unspecified: Secondary | ICD-10-CM

## 2016-04-22 DIAGNOSIS — I4510 Unspecified right bundle-branch block: Secondary | ICD-10-CM

## 2016-04-22 NOTE — Telephone Encounter (Signed)
Patient seen and preoperative clearance letter given to the patient to be taken to Dr. William DaltonGhobriel's office tomorrow

## 2016-04-22 NOTE — Progress Notes (Addendum)
Cardiology Office Note    Date:  04/22/2016   ID:  Ralph Dawson, DOB 05/07/1940, MRN [id number]  PCP:  Josue HectorNYLAND,LEONARD ROBERT, MD  Cardiologist:  Dr. Tresa EndoKelly  Chief Complaint  Patient presents with  . Pre-op Exam    seen for Dr. Tresa EndoKelly, preop clearance for lumbar surgery by Dr. Aquilla HackerGhobrial    History of Present Illness:  Ralph Dawson is a 76 y.o. male with PMH of PVD s/p bilateral carotid endarterectomy (L CEA 12/2007, R CEA 07/2009, patent on last carotid U/S 10/03/2012), LE intermittent claudication (normal ABI 12/27/2014), HLD, mild vavlular heart disease (mild MR/TR) and chronic RBBB. He had a normal nuclear perfusion study in November 2011. Last echocardiogram obtained on 02/12/2014 showed EF 65-70%, no regional wall motion abnormality, grade 1 diastolic dysfunction. He also had GERD symptoms that seem improved with pantoprazole. His hyperlipidemia is well controlled on pravastatin 80 mg. He was last seen in the cardiology clinic on 08/02/2014, he was still smoking at the time.  He has been followed by Dr. Dorinda HillGeorge Ghobrial with [facility] for chronic back pain and right hip and leg pain. He has failed medical therapy. 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 He has been scheduled for lumbar surgery. He has been scheduled for posterior lumbar L4-L5 laminectomy and medial facetectomy for 04/29/2016. According to the patient, he continued to smoke at this time and had been smoking for the past 50 years. He is very honest about his opinion of not quitting smoking at this time, he says been smoking for more than 50 years and says he will continue to do so until one day he starts having significant shortness of breath. I have discussed with him potential correlation between smoking and cardiac issues as well, he fully understands risk and chooses to continue to smoke. Otherwise from cardiology perspective, he has been doing quite well, he denies any chest discomfort or shortness breath.  There is no significant lower extremity edema. Despite history of mild valvular issue, there is no significant murmur on physical exam. Given lack of symptom, he is cleared for surgery from cardiology perspective. His blood pressure remain slightly high which he has attributed this to pain and agitation. However I also notified him that his blood pressure was high even in April 2016 when we last saw the patient. However at this point, I will not add any blood pressure medication to his regimen, I have advised him to follow-up with his primary care physician after surgery for blood pressure medication titration.    Past Medical History:  Diagnosis Date  . Arthritis   . Carotid artery occlusion   . Carotid stenosis 09/24/2011   R ICA patent w/ hx of endarterectomy, stenosis 1-39%;  L ICA patent w/ hx of endarterectomy; external carotids appear patent; see imaging tab for full report  . Chest heaviness 02/25/2009   PVCs, atrial bigeminy  . Claudication (HCC) 06/17/2011   LE doppler - bilateral ABIs normal values at rest; R CIA >50% diameter reduction L CIA 0-49% reduction; bilateral SFAs mild/mod mixed density plaque throughout suggesting 50-69% diameter reduction  . GERD (gastroesophageal reflux disease)   . Hyperlipidemia   . Hypertension 02/13/2010   echo - EF >55%; mild mitral annular calcification; mild aortic valve sclerosis  . RBBB (right bundle branch block) 02/13/2010   R/P MV - EF 67%; normal perfusion all regions; no significant wall abnormalties noted    Past Surgical History:  Procedure Laterality Date  .  APPENDECTOMY    . ARTERIAL BYPASS SURGRY    . CAROTID ENDARTERECTOMY  07-15-2009   Right CEA  . CAROTID ENDARTERECTOMY  12-13-2007   Left CEA    Current Medications: Outpatient Medications Prior to Visit  Medication Sig Dispense Refill  . aspirin 81 MG tablet Take 81 mg by mouth daily.    Marland Kitchen donepezil (ARICEPT) 5 MG tablet Take 1 tablet by mouth daily.    . DULoxetine  (CYMBALTA) 60 MG capsule Take 60 mg by mouth daily.    . isosorbide mononitrate (IMDUR) 30 MG 24 hr tablet TAKE 1 TABLET DAILY 90 tablet 3  . pantoprazole (PROTONIX) 40 MG tablet Take 1 tablet by mouth daily.    . pravastatin (PRAVACHOL) 80 MG tablet Take 80 mg by mouth daily.    . tamsulosin (FLOMAX) 0.4 MG CAPS Take 0.4 mg by mouth daily.     No facility-administered medications prior to visit.      Allergies:   Patient has no known allergies.   Social History   Social History  . Marital status: Married    Spouse name: N/A  . Number of children: N/A  . Years of education: N/A   Social History Main Topics  . Smoking status: Current Every Day Smoker    Packs/day: 0.50    Types: Cigarettes  . Smokeless tobacco: Never Used  . Alcohol use No  . Drug use: No  . Sexual activity: Not Asked   Other Topics Concern  . None   Social History Narrative  . None     Family History:  The patient's family history includes Cancer in his daughter, mother, and sister; Diabetes in his brother.   ROS:   Please see the history of present illness.    ROS All other systems reviewed and are negative.   PHYSICAL EXAM:   VS:  BP (!) 148/88   Pulse 76   Ht 5\' 8"  (1.727 m)   Wt 162 lb 6.4 oz (73.7 kg)   BMI 24.69 kg/m    GEN: Well nourished, well developed, in no acute distress  HEENT: normal  Neck: no JVD, carotid bruits, or masses Cardiac: RRR; no murmurs, rubs, or gallops,no edema  Respiratory:  clear to auscultation bilaterally, normal work of breathing GI: soft, nontender, nondistended, + BS MS: no deformity or atrophy  Skin: warm and dry, no rash Neuro:  Alert and Oriented x 3, Strength and sensation are intact Psych: euthymic mood, full affect  Wt Readings from Last 3 Encounters:  04/22/16 162 lb 6.4 oz (73.7 kg)  08/02/14 165 lb 9.6 oz (75.1 kg)  01/26/14 171 lb 9.6 oz (77.8 kg)      Studies/Labs Reviewed:   EKG:  EKG is ordered today.  The ekg ordered today  demonstrates Normal sinus rhythm, right bundle branch block. No significant T-wave changes.  Recent Labs: No results found for requested labs within last 8760 hours.   Lipid Panel No results found for: CHOL, TRIG, HDL, CHOLHDL, VLDL, LDLCALC, LDLDIRECT  Additional studies/ records that were reviewed today include:   Myoview 02/13/2010   Echo 02/12/2014  LV EF: 65% -  70%  - Left ventricle: The cavity size was normal. Wall thickness was increased in a pattern of mild LVH. Systolic function was vigorous. The estimated ejection fraction was in the range of 65% to 70%. Wall motion was normal; there were no regional wall motion abnormalities. Doppler parameters are consistent with abnormal left ventricular relaxation (grade 1 diastolic dysfunction).  ASSESSMENT:    1. Pre-operative cardiovascular examination   2. Bilateral carotid artery disease (HCC)   3. Hyperlipidemia, unspecified hyperlipidemia type   4. Non-rheumatic mitral regurgitation   5. RBBB      PLAN:  In order of problems listed above:  1. Operative clearance: Patient has never had a heart attack, he did have bilateral carotid artery disease underwent endarterectomy. He has not had any exertional chest discomfort or shortness of breath. He did not have any significant heart murmur either. Given the fact that he has not been symptomatic, no further workup is needed prior to surgery. He is relatively low risk for the intended procedure. If aspirin need to be held prior to surgery, have instructed the patient to hold it for 7 days prior for the intended procedure.  2. Bilateral carotid artery disease: No significant bruit on physical exam.  3. Hyperlipidemia: On Pravachol  4. Mild MR: seen on previous echocardiogram, however does not have any significant heart murmur on physical exam.    Medication Adjustments/Labs and Tests Ordered: Current medicines are reviewed at length with the patient today.   Concerns regarding medicines are outlined above.  Medication changes, Labs and Tests ordered today are listed in the Patient Instructions below. Patient Instructions  Medication Instructions:  Your physician recommends that you continue on your current medications as directed. Please refer to the Current Medication list given to you today.   Labwork: None ordered  Testing/Procedures: None ordered  Follow-Up: Your physician wants you to follow-up in: 1 year with Dr.Kelly You will receive a reminder letter in the mail two months in advance. If you don't receive a letter, please call our office to schedule the follow-up appointment.   Any Other Special Instructions Will Be Listed Below (If Applicable). Your cleared for your upcoming surgery with [facility]     If you need a refill on your cardiac medications before your next appointment, please call your pharmacy.      Ramond Dial, [place]  04/22/2016 11:07 PM    [facility] [address] [place], [place], [place]  [address] Phone: [phone]; Fax: [phone]

## 2016-04-22 NOTE — Patient Instructions (Signed)
Medication Instructions:  Your physician recommends that you continue on your current medications as directed. Please refer to the Current Medication list given to you today.   Labwork: None ordered  Testing/Procedures: None ordered  Follow-Up: Your physician wants you to follow-up in: 1 year with Dr.Kelly You will receive a reminder letter in the mail two months in advance. If you don't receive a letter, please call our office to schedule the follow-up appointment.   Any Other Special Instructions Will Be Listed Below (If Applicable). Your cleared for your upcoming surgery with [facility]     If you need a refill on your cardiac medications before your next appointment, please call your pharmacy.

## 2017-01-26 DIAGNOSIS — Z23 Encounter for immunization: Secondary | ICD-10-CM | POA: Diagnosis not present

## 2017-01-26 DIAGNOSIS — I2510 Atherosclerotic heart disease of native coronary artery without angina pectoris: Secondary | ICD-10-CM | POA: Diagnosis not present

## 2017-01-26 DIAGNOSIS — J309 Allergic rhinitis, unspecified: Secondary | ICD-10-CM | POA: Diagnosis not present

## 2017-01-26 DIAGNOSIS — K219 Gastro-esophageal reflux disease without esophagitis: Secondary | ICD-10-CM | POA: Diagnosis not present

## 2017-01-26 DIAGNOSIS — I10 Essential (primary) hypertension: Secondary | ICD-10-CM | POA: Diagnosis not present

## 2017-01-26 DIAGNOSIS — J449 Chronic obstructive pulmonary disease, unspecified: Secondary | ICD-10-CM | POA: Diagnosis not present

## 2017-02-01 DIAGNOSIS — K219 Gastro-esophageal reflux disease without esophagitis: Secondary | ICD-10-CM | POA: Diagnosis not present

## 2017-02-01 DIAGNOSIS — E785 Hyperlipidemia, unspecified: Secondary | ICD-10-CM | POA: Diagnosis not present

## 2017-02-04 DIAGNOSIS — S80829A Blister (nonthermal), unspecified lower leg, initial encounter: Secondary | ICD-10-CM | POA: Diagnosis not present

## 2017-02-04 DIAGNOSIS — I2510 Atherosclerotic heart disease of native coronary artery without angina pectoris: Secondary | ICD-10-CM | POA: Diagnosis not present

## 2017-02-04 DIAGNOSIS — I10 Essential (primary) hypertension: Secondary | ICD-10-CM | POA: Diagnosis not present

## 2017-02-15 DIAGNOSIS — J309 Allergic rhinitis, unspecified: Secondary | ICD-10-CM | POA: Diagnosis not present

## 2017-02-15 DIAGNOSIS — I2510 Atherosclerotic heart disease of native coronary artery without angina pectoris: Secondary | ICD-10-CM | POA: Diagnosis not present

## 2017-02-15 DIAGNOSIS — I10 Essential (primary) hypertension: Secondary | ICD-10-CM | POA: Diagnosis not present

## 2017-02-15 DIAGNOSIS — K219 Gastro-esophageal reflux disease without esophagitis: Secondary | ICD-10-CM | POA: Diagnosis not present

## 2017-02-15 DIAGNOSIS — R42 Dizziness and giddiness: Secondary | ICD-10-CM | POA: Diagnosis not present

## 2017-03-03 DIAGNOSIS — J0100 Acute maxillary sinusitis, unspecified: Secondary | ICD-10-CM | POA: Diagnosis not present

## 2017-03-03 DIAGNOSIS — I2510 Atherosclerotic heart disease of native coronary artery without angina pectoris: Secondary | ICD-10-CM | POA: Diagnosis not present

## 2017-03-03 DIAGNOSIS — J209 Acute bronchitis, unspecified: Secondary | ICD-10-CM | POA: Diagnosis not present

## 2017-03-03 DIAGNOSIS — I10 Essential (primary) hypertension: Secondary | ICD-10-CM | POA: Diagnosis not present

## 2017-03-16 DIAGNOSIS — H903 Sensorineural hearing loss, bilateral: Secondary | ICD-10-CM | POA: Diagnosis not present

## 2017-03-16 DIAGNOSIS — J300 Vasomotor rhinitis: Secondary | ICD-10-CM | POA: Diagnosis not present

## 2017-03-16 DIAGNOSIS — H9313 Tinnitus, bilateral: Secondary | ICD-10-CM | POA: Diagnosis not present

## 2017-03-16 DIAGNOSIS — R42 Dizziness and giddiness: Secondary | ICD-10-CM | POA: Diagnosis not present

## 2017-03-17 IMAGING — CR DG LUMBAR SPINE 2-3V
3 series · 3 of 3 positions shown · non-contrast
Comparison: February 13, 2011

CLINICAL DATA: Right-sided sciatica

EXAM:
LUMBAR SPINE - 2-3 VIEW

[w lumbar spine ap]
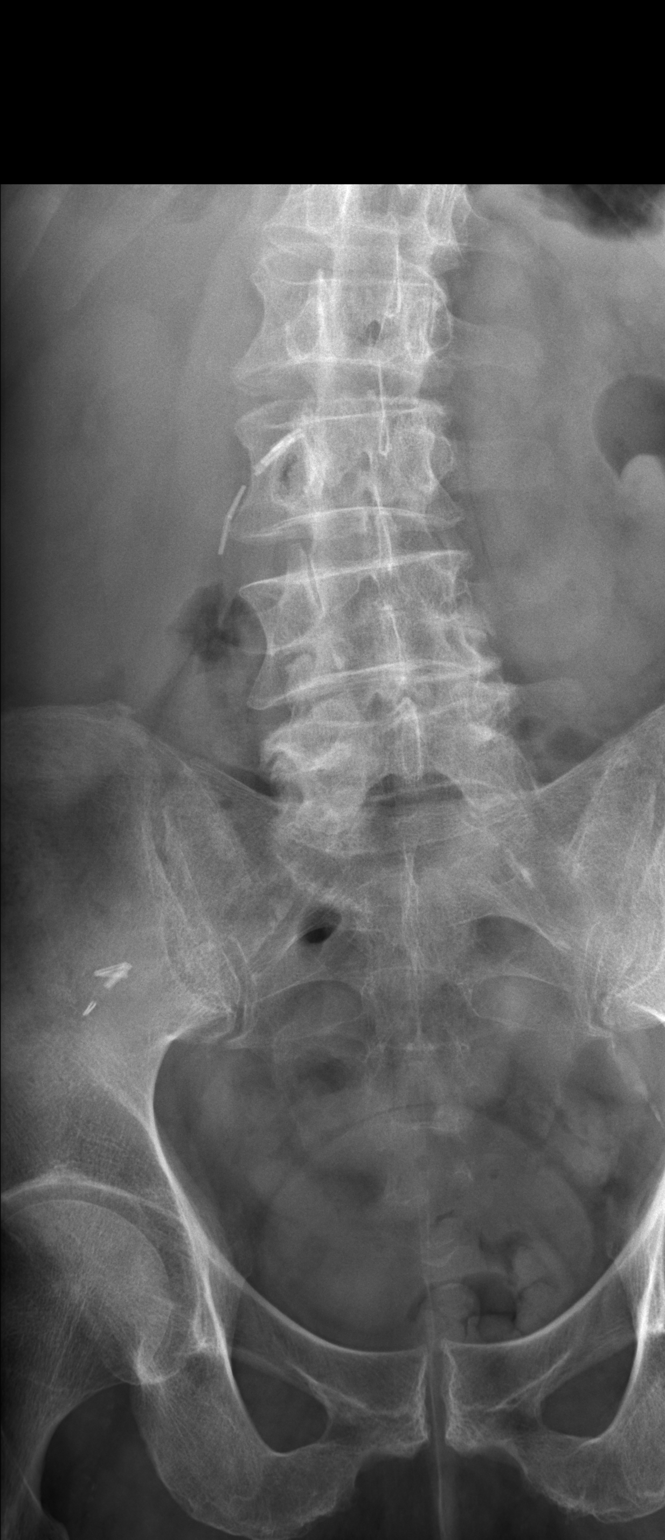

[w lumbar spine lat]
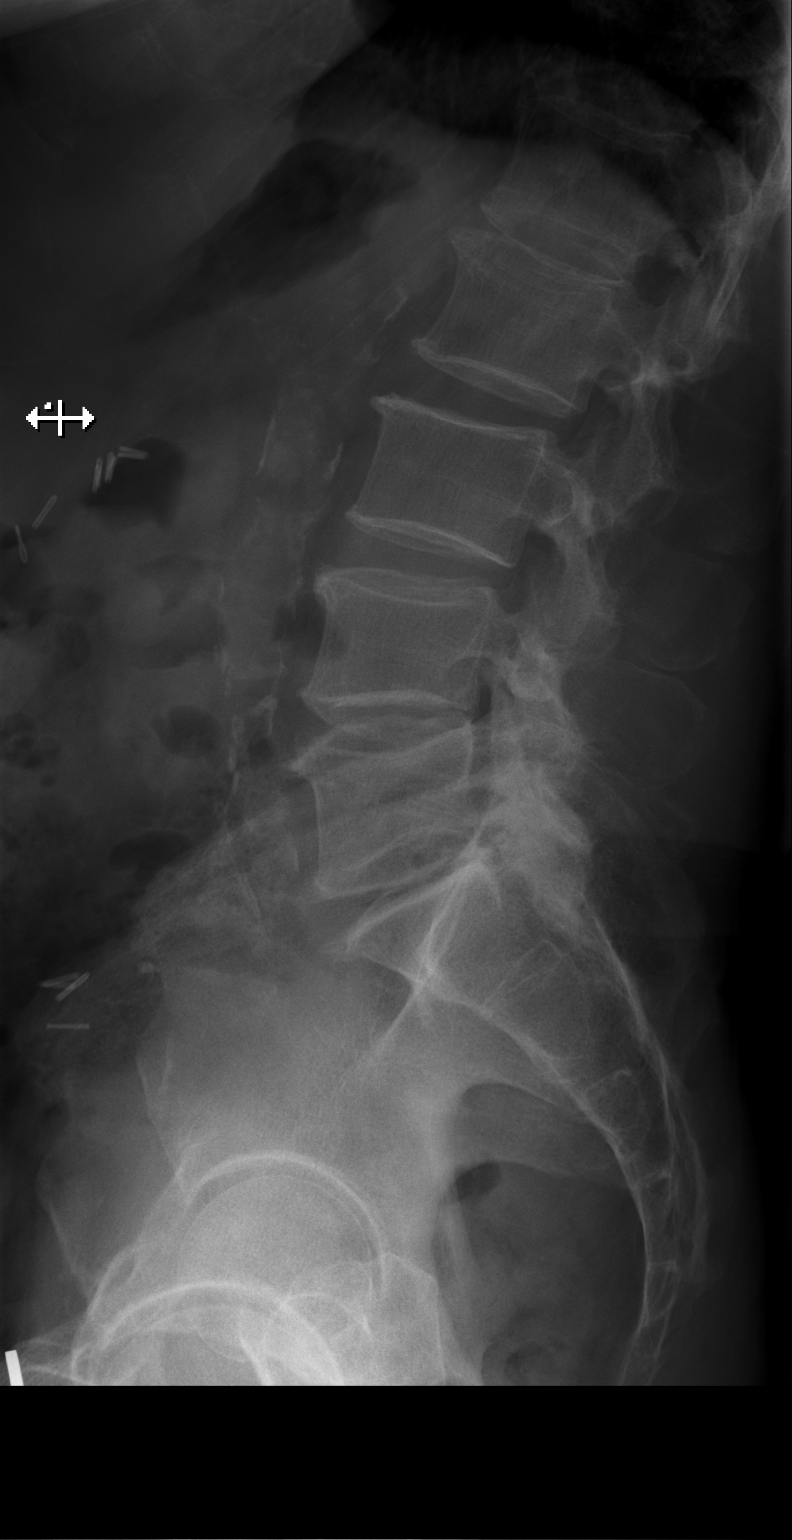

[w lumbar l-5 s-1 spot]
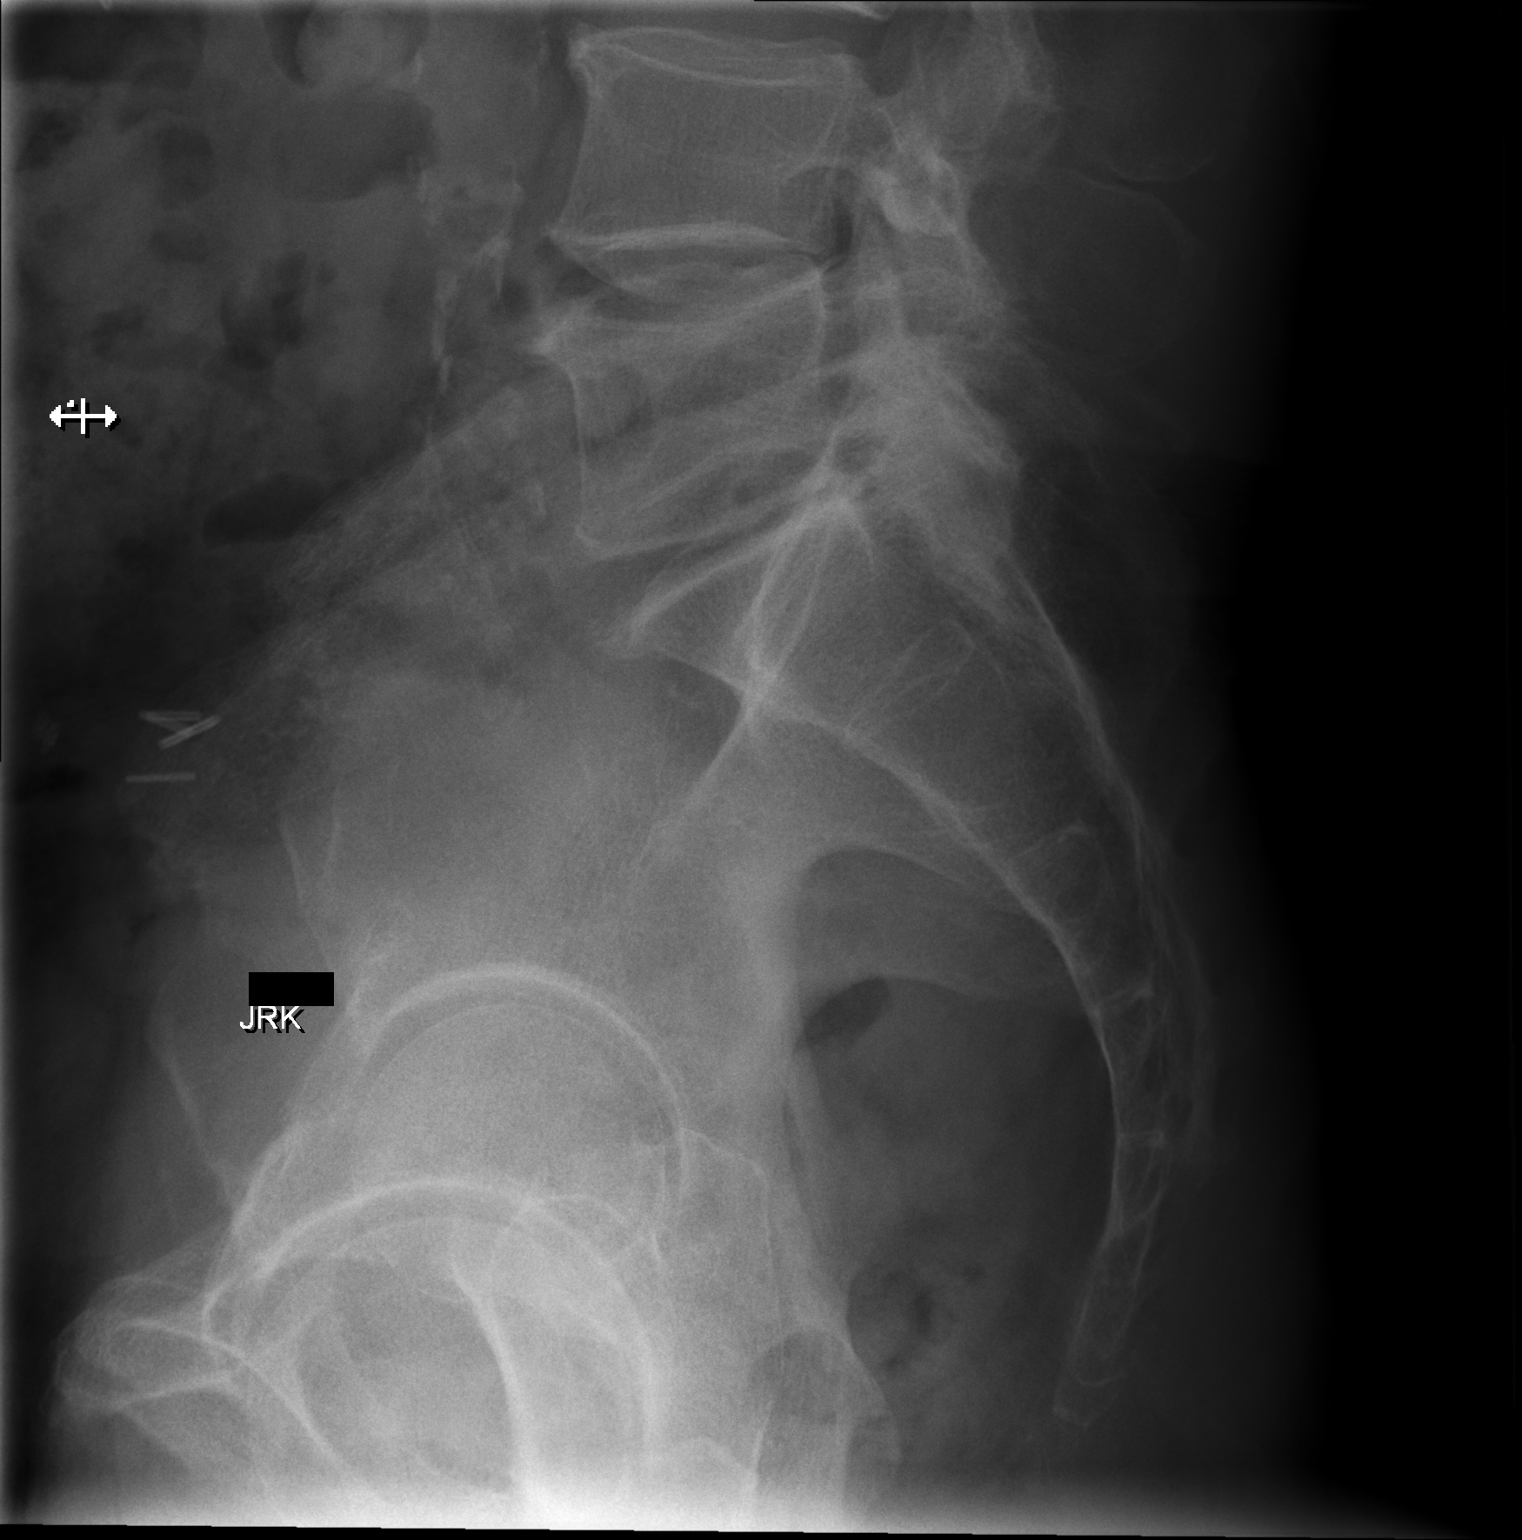

[3 of 3 positions shown; findings below may reference images not displayed]

FINDINGS: Frontal, lateral, and spot lumbosacral lateral images were obtained.
There are 5 non-rib-bearing lumbar type vertebral bodies. There is
lumbar levoscoliosis with rotatory component. There is no fracture
or spondylolisthesis. There is moderately severe disc space
narrowing at L4-5 and L5-S1 with moderate disc space narrowing at
L1-2. No erosive change. There is atherosclerotic calcification in
the aorta.
IMPRESSION: Scoliosis. Osteoarthritic changes several levels, progressed from
prior study. No fracture or spondylolisthesis. There is aortic
atherosclerosis.

## 2017-03-19 DIAGNOSIS — H9193 Unspecified hearing loss, bilateral: Secondary | ICD-10-CM | POA: Diagnosis not present

## 2017-03-19 DIAGNOSIS — H903 Sensorineural hearing loss, bilateral: Secondary | ICD-10-CM | POA: Diagnosis not present

## 2017-03-19 DIAGNOSIS — R9082 White matter disease, unspecified: Secondary | ICD-10-CM | POA: Diagnosis not present

## 2017-03-25 DIAGNOSIS — H6123 Impacted cerumen, bilateral: Secondary | ICD-10-CM | POA: Diagnosis not present

## 2017-03-25 DIAGNOSIS — R42 Dizziness and giddiness: Secondary | ICD-10-CM | POA: Diagnosis not present

## 2017-04-26 ENCOUNTER — Inpatient Hospital Stay (HOSPITAL_COMMUNITY)
Admission: EM | Admit: 2017-04-26 | Discharge: 2017-04-28 | DRG: 247 | Disposition: A | Discharge status: Home / Self Care | Payer: BLUE CROSS/BLUE SHIELD | Attending: Cardiology | Admitting: Cardiology

## 2017-04-26 ENCOUNTER — Encounter (HOSPITAL_COMMUNITY): Payer: Self-pay | Admitting: Emergency Medicine

## 2017-04-26 ENCOUNTER — Emergency Department (HOSPITAL_COMMUNITY): Payer: BLUE CROSS/BLUE SHIELD

## 2017-04-26 ENCOUNTER — Other Ambulatory Visit: Payer: Self-pay

## 2017-04-26 DIAGNOSIS — R195 Other fecal abnormalities: Secondary | ICD-10-CM | POA: Diagnosis not present

## 2017-04-26 DIAGNOSIS — R079 Chest pain, unspecified: Secondary | ICD-10-CM | POA: Diagnosis not present

## 2017-04-26 DIAGNOSIS — I739 Peripheral vascular disease, unspecified: Secondary | ICD-10-CM | POA: Diagnosis not present

## 2017-04-26 DIAGNOSIS — I4510 Unspecified right bundle-branch block: Secondary | ICD-10-CM | POA: Diagnosis present

## 2017-04-26 DIAGNOSIS — K219 Gastro-esophageal reflux disease without esophagitis: Secondary | ICD-10-CM | POA: Diagnosis not present

## 2017-04-26 DIAGNOSIS — D72829 Elevated white blood cell count, unspecified: Secondary | ICD-10-CM | POA: Diagnosis not present

## 2017-04-26 DIAGNOSIS — I214 Non-ST elevation (NSTEMI) myocardial infarction: Secondary | ICD-10-CM | POA: Diagnosis not present

## 2017-04-26 DIAGNOSIS — E785 Hyperlipidemia, unspecified: Secondary | ICD-10-CM | POA: Diagnosis present

## 2017-04-26 DIAGNOSIS — R197 Diarrhea, unspecified: Secondary | ICD-10-CM | POA: Diagnosis not present

## 2017-04-26 DIAGNOSIS — E869 Volume depletion, unspecified: Secondary | ICD-10-CM | POA: Diagnosis not present

## 2017-04-26 DIAGNOSIS — Z720 Tobacco use: Secondary | ICD-10-CM | POA: Diagnosis not present

## 2017-04-26 DIAGNOSIS — N183 Chronic kidney disease, stage 3 unspecified: Secondary | ICD-10-CM | POA: Diagnosis present

## 2017-04-26 DIAGNOSIS — I480 Paroxysmal atrial fibrillation: Secondary | ICD-10-CM | POA: Diagnosis present

## 2017-04-26 DIAGNOSIS — N179 Acute kidney failure, unspecified: Secondary | ICD-10-CM | POA: Diagnosis not present

## 2017-04-26 DIAGNOSIS — J449 Chronic obstructive pulmonary disease, unspecified: Secondary | ICD-10-CM | POA: Diagnosis not present

## 2017-04-26 DIAGNOSIS — M1990 Unspecified osteoarthritis, unspecified site: Secondary | ICD-10-CM | POA: Diagnosis present

## 2017-04-26 DIAGNOSIS — Z7982 Long term (current) use of aspirin: Secondary | ICD-10-CM

## 2017-04-26 DIAGNOSIS — Z833 Family history of diabetes mellitus: Secondary | ICD-10-CM | POA: Diagnosis not present

## 2017-04-26 DIAGNOSIS — I25110 Atherosclerotic heart disease of native coronary artery with unstable angina pectoris: Secondary | ICD-10-CM | POA: Diagnosis present

## 2017-04-26 DIAGNOSIS — I10 Essential (primary) hypertension: Secondary | ICD-10-CM | POA: Diagnosis present

## 2017-04-26 DIAGNOSIS — Z79899 Other long term (current) drug therapy: Secondary | ICD-10-CM | POA: Diagnosis not present

## 2017-04-26 DIAGNOSIS — I6529 Occlusion and stenosis of unspecified carotid artery: Secondary | ICD-10-CM | POA: Diagnosis present

## 2017-04-26 DIAGNOSIS — F17210 Nicotine dependence, cigarettes, uncomplicated: Secondary | ICD-10-CM | POA: Diagnosis not present

## 2017-04-26 DIAGNOSIS — R011 Cardiac murmur, unspecified: Secondary | ICD-10-CM | POA: Diagnosis present

## 2017-04-26 DIAGNOSIS — Z716 Tobacco abuse counseling: Secondary | ICD-10-CM

## 2017-04-26 DIAGNOSIS — N289 Disorder of kidney and ureter, unspecified: Secondary | ICD-10-CM | POA: Diagnosis not present

## 2017-04-26 DIAGNOSIS — R001 Bradycardia, unspecified: Secondary | ICD-10-CM | POA: Diagnosis present

## 2017-04-26 DIAGNOSIS — F419 Anxiety disorder, unspecified: Secondary | ICD-10-CM | POA: Diagnosis present

## 2017-04-26 DIAGNOSIS — R0789 Other chest pain: Secondary | ICD-10-CM | POA: Diagnosis not present

## 2017-04-26 DIAGNOSIS — I4892 Unspecified atrial flutter: Secondary | ICD-10-CM | POA: Diagnosis not present

## 2017-04-26 DIAGNOSIS — Z955 Presence of coronary angioplasty implant and graft: Secondary | ICD-10-CM

## 2017-04-26 DIAGNOSIS — I129 Hypertensive chronic kidney disease with stage 1 through stage 4 chronic kidney disease, or unspecified chronic kidney disease: Secondary | ICD-10-CM | POA: Diagnosis present

## 2017-04-26 HISTORY — DX: Non-ST elevation (NSTEMI) myocardial infarction: I21.4

## 2017-04-26 LAB — CBC
HCT: 44.6 % (ref 39.0–52.0)
Hemoglobin: 15.2 g/dL (ref 13.0–17.0)
MCH: 32.1 pg (ref 26.0–34.0)
MCHC: 34.1 g/dL (ref 30.0–36.0)
MCV: 94.1 fL (ref 78.0–100.0)
PLATELETS: 208 10*3/uL (ref 150–400)
RBC: 4.74 MIL/uL (ref 4.22–5.81)
RDW: 13.1 % (ref 11.5–15.5)
WBC: 7.7 10*3/uL (ref 4.0–10.5)

## 2017-04-26 LAB — TSH: TSH: 2.253 u[IU]/mL (ref 0.350–4.500)

## 2017-04-26 LAB — BASIC METABOLIC PANEL
Anion gap: 9 (ref 5–15)
BUN: 14 mg/dL (ref 6–20)
CO2: 23 mmol/L (ref 22–32)
Calcium: 8.9 mg/dL (ref 8.9–10.3)
Chloride: 106 mmol/L (ref 101–111)
Creatinine, Ser: 1.36 mg/dL — ABNORMAL HIGH (ref 0.61–1.24)
GFR calc Af Amer: 56 mL/min — ABNORMAL LOW (ref >60)
GFR calc non Af Amer: 49 mL/min — ABNORMAL LOW (ref >60)
GLUCOSE: 106 mg/dL — AB (ref 65–99)
Potassium: 3.8 mmol/L (ref 3.5–5.1)
SODIUM: 138 mmol/L (ref 135–145)

## 2017-04-26 LAB — MAGNESIUM: Magnesium: 2.1 mg/dL (ref 1.7–2.4)

## 2017-04-26 LAB — I-STAT TROPONIN, ED: Troponin i, poc: 0 ng/mL (ref 0.00–0.08)

## 2017-04-26 MED ORDER — ASPIRIN 81 MG PO CHEW
324.0000 mg | CHEWABLE_TABLET | ORAL | Status: AC
  Administered 2017-04-26: 324 mg via ORAL
  Filled 2017-04-26: qty 4

## 2017-04-26 MED ORDER — SODIUM CHLORIDE 0.9 % IV SOLN
Freq: Once | INTRAVENOUS | Status: AC
  Administered 2017-04-26: 16:00:00 via INTRAVENOUS

## 2017-04-26 MED ORDER — ONDANSETRON HCL 4 MG/2ML IJ SOLN: 4.0000 mg | Freq: Four times a day (QID) | INTRAMUSCULAR | Status: DC | PRN

## 2017-04-26 MED ORDER — NITROGLYCERIN 0.4 MG SL SUBL: 0.4000 mg | SUBLINGUAL_TABLET | SUBLINGUAL | Status: DC | PRN

## 2017-04-26 MED ORDER — TAMSULOSIN HCL 0.4 MG PO CAPS
0.4000 mg | ORAL_CAPSULE | Freq: Every day | ORAL | Status: DC
  Administered 2017-04-27 – 2017-04-28 (×2): 0.4 mg via ORAL
  Filled 2017-04-26 (×2): qty 1

## 2017-04-26 MED ORDER — IPRATROPIUM BROMIDE 0.06 % NA SOLN: 1.0000 | Freq: Three times a day (TID) | NASAL | Status: DC | PRN

## 2017-04-26 MED ORDER — ACETAMINOPHEN 325 MG PO TABS: 650.0000 mg | ORAL_TABLET | ORAL | Status: DC | PRN

## 2017-04-26 MED ORDER — NITROGLYCERIN IN D5W 200-5 MCG/ML-% IV SOLN
3.0000 ug/min | INTRAVENOUS | Status: DC
  Filled 2017-04-26: qty 250

## 2017-04-26 MED ORDER — HEPARIN BOLUS VIA INFUSION
4000.0000 [IU] | Freq: Once | INTRAVENOUS | Status: AC
  Administered 2017-04-26: 4000 [IU] via INTRAVENOUS
  Filled 2017-04-26: qty 4000

## 2017-04-26 MED ORDER — DONEPEZIL HCL 10 MG PO TABS
10.0000 mg | ORAL_TABLET | Freq: Every day | ORAL | Status: DC
  Administered 2017-04-28: 11:00:00 10 mg via ORAL
  Filled 2017-04-26: qty 1

## 2017-04-26 MED ORDER — ASPIRIN 300 MG RE SUPP: 300.0000 mg | RECTAL | Status: AC

## 2017-04-26 MED ORDER — HEPARIN (PORCINE) IN NACL 100-0.45 UNIT/ML-% IJ SOLN
1100.0000 [IU]/h | INTRAMUSCULAR | Status: DC
  Administered 2017-04-26: 900 [IU]/h via INTRAVENOUS
  Filled 2017-04-26 (×2): qty 250

## 2017-04-26 MED ORDER — DULOXETINE HCL 60 MG PO CPEP
60.0000 mg | ORAL_CAPSULE | Freq: Every day | ORAL | Status: DC
  Administered 2017-04-27 – 2017-04-28 (×2): 60 mg via ORAL
  Filled 2017-04-26 (×2): qty 1

## 2017-04-26 MED ORDER — PRAVASTATIN SODIUM 40 MG PO TABS
80.0000 mg | ORAL_TABLET | Freq: Every day | ORAL | Status: DC
  Administered 2017-04-26: 80 mg via ORAL
  Filled 2017-04-26: qty 2

## 2017-04-26 MED ORDER — ASPIRIN EC 81 MG PO TBEC: 81.0000 mg | DELAYED_RELEASE_TABLET | Freq: Every day | ORAL | Status: DC

## 2017-04-26 NOTE — H&P (Signed)
Cardiology Admission History and Physical:   Patient ID: Ralph Dawson; MRN: [id number]; DOB: [birth year]   Admission date: 04/26/2017  Primary Care Provider: Joette Catching, MD Primary Cardiologist: Dr Tresa Endo  Chief Complaint:  Chest pain  Patient Profile:   Ralph Dawson is a 77 y.o. male with a history of PVD, s/p bilateral CEA, smoker, HTN, HLD, and CRI who presented to [facility] ED with SSCP.  History of Present Illness:   Ralph Dawson has been followed by Dr Tresa Endo though he hasn't been seen by Dr Tresa Endo since 2016. The pt does follow up with his PCP. The pt has no history of MI or documented CAD. He had a low risk Myoview in 2011 and a normal echo Nov 2015. He does have PVD and had LCE 2009 and a RCE in 2011. He was cleared by [place] for back surgery in Jan 2018 (which he had at [facility] without complication).   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 He was actually on the phone when he developed mid sternal chest pain which he though was indigestion. He drank some Coke without relief. EMS was called and he says he did get relief from SL NTG. He is pain free now. He denies any associated nausea, vomiting, diaphoresis , or radiation to his arms, jaw,m or back. He does admit to exertional chest discomfort doing yard work "for some time".    Past Medical History:  Diagnosis Date  . Arthritis   . Carotid artery occlusion   . Carotid stenosis 09/24/2011   R ICA patent w/ hx of endarterectomy, stenosis 1-39%;  L ICA patent w/ hx of endarterectomy; external carotids appear patent; see imaging tab for full report  . Chest heaviness 02/25/2009   PVCs, atrial bigeminy  . Claudication (HCC) 06/17/2011   LE doppler - bilateral ABIs normal values at rest; R CIA >50% diameter reduction L CIA 0-49% reduction; bilateral SFAs mild/mod mixed density plaque throughout suggesting 50-69% diameter reduction  . GERD (gastroesophageal reflux disease)   . Hyperlipidemia   .  Hypertension 02/13/2010   echo - EF >55%; mild mitral annular calcification; mild aortic valve sclerosis  . RBBB (right bundle branch block) 02/13/2010   R/P MV - EF 67%; normal perfusion all regions; no significant wall abnormalties noted    Past Surgical History:  Procedure Laterality Date  . APPENDECTOMY    . ARTERIAL BYPASS SURGRY    . CAROTID ENDARTERECTOMY  07-15-2009   Right CEA  . CAROTID ENDARTERECTOMY  12-13-2007   Left CEA     Medications Prior to Admission: Prior to Admission medications   Medication Sig Start Date End Date Taking? Authorizing Provider  acetaminophen (TYLENOL) 500 MG tablet Take 1,000 mg by mouth every 6 (six) hours as needed for moderate pain.   Yes [provider]  aspirin 325 MG tablet Take 325 mg by mouth daily.    Yes [provider]  donepezil (ARICEPT) 10 MG tablet Take 10 mg by mouth daily.  11/20/13  Yes [provider]  DULoxetine (CYMBALTA) 60 MG capsule Take 60 mg by mouth daily.   Yes [provider]  ipratropium (ATROVENT) 0.06 % nasal spray Place 1 spray into the nose 3 (three) times daily as needed. 04/12/17  Yes [provider]  isosorbide mononitrate (IMDUR) 30 MG 24 hr tablet TAKE 1 TABLET DAILY 03/26/14  Yes Lennette Bihari, MD  Menthol-Methyl Salicylate (MUSCLE RUB) 10-15 % CREA Apply 1 application topically as needed  for muscle pain.   Yes [provider]  pravastatin (PRAVACHOL) 80 MG tablet Take 80 mg by mouth daily.   Yes [provider]  ranitidine (ZANTAC) 150 MG capsule Take 150 mg by mouth 2 (two) times daily. 03/09/17  Yes [provider]  tamsulosin (FLOMAX) 0.4 MG CAPS Take 0.4 mg by mouth daily.   Yes [provider]     Allergies:   No Known Allergies  Social History:   Social History   Socioeconomic History  . Marital status: Married    Spouse name: Not on file  . Number of children: Not on file  . Years of education: Not on file  .  Highest education level: Not on file  Social Needs  . Financial resource strain: Not on file  . Food insecurity - worry: Not on file  . Food insecurity - inability: Not on file  . Transportation needs - medical: Not on file  . Transportation needs - non-medical: Not on file  Occupational History  . Not on file  Tobacco Use  . Smoking status: Current Every Day Smoker    Packs/day: 0.50    Types: Cigarettes  . Smokeless tobacco: Never Used  Substance and Sexual Activity  . Alcohol use: No  . Drug use: No  . Sexual activity: Not on file  Other Topics Concern  . Not on file  Social History Narrative  . Not on file    Family History:   The patient's family history includes Cancer in his daughter, mother, and sister; Diabetes in his brother.    ROS:  Please see the history of present illness.  LEA dopplers normal 2016. I suspect the pt's history of claudication was probably secondary to back issues which resolved after his back surgery last year.  All other ROS reviewed and negative.  No history of GI bleeding  Physical Exam/Data:   Vitals:   04/26/17 1445 04/26/17 1500 04/26/17 1515 04/26/17 1545  BP: (!) 141/97 (!) 150/81 (!) 145/85 (!) 142/86  Pulse: (!) 141 73 72 75  Resp: 16 17 16 15   Temp:      TempSrc:      SpO2: 98% 99% 100% 100%   No intake or output data in the 24 hours ending 04/26/17 1710 There were no vitals filed for this visit. There is no height or weight on file to calculate BMI.  General:  Well nourished, well developed, in no acute distress HEENT: normal Lymph: no adenopathy Neck: no JVD, bilateral CEA scar Endocrine:  No thryomegaly Vascular: No carotid bruits; PT pulses 2+,  Bilateral FA without bruits  Cardiac:  normal S1, S2; RRR; no murmur  Lungs:  Decreased breath sounds c/w COPD Abd: soft, nontender, no hepatomegaly, no bruit Ext: no edema Musculoskeletal:  No deformities, BUE and BLE strength normal and equal Skin: warm and dry  Neuro:   CNs 2-12 intact, no focal abnormalities noted Psych:  Normal affect    EKG:  The ECG that was done was personally reviewed and demonstrates initial EKG showed possibly atrial flutter with RBBB, f/u NSR with RBBB  Relevant CV Studies:  Laboratory Data:  Chemistry Recent Labs  Lab 04/26/17 1439  NA 138  K 3.8  CL 106  CO2 23  GLUCOSE 106*  BUN 14  CREATININE 1.36*  CALCIUM 8.9  GFRNONAA 49*  GFRAA 56*  ANIONGAP 9    No results for input(s): PROT, ALBUMIN, AST, ALT, ALKPHOS, BILITOT in the last 168 hours. Hematology  Recent Labs  Lab 04/26/17 1439  WBC 7.7  RBC 4.74  HGB 15.2  HCT 44.6  MCV 94.1  MCH 32.1  MCHC 34.1  RDW 13.1  PLT 208   Cardiac EnzymesNo results for input(s): TROPONINI in the last 168 hours.  Recent Labs  Lab 04/26/17 1456  TROPIPOC 0.00    BNPNo results for input(s): BNP, PROBNP in the last 168 hours.  DDimer No results for input(s): DDIMER in the last 168 hours.  Radiology/Studies:  Dg Chest Port 1 View  Result Date: 04/26/2017 CLINICAL DATA:  Substernal non radiating chest pain, EKG depression leads in 4-6, history hypertension, RIGHT bundle branch block, hyperlipidemia, smoker EXAM: PORTABLE CHEST 1 VIEW COMPARISON:  Portable exam 1523 hours compared to 11/11/2011 FINDINGS: External pacing lead projects over LEFT lung. Normal heart size, mediastinal contours, and pulmonary vascularity. Atherosclerotic calcification aorta. Minimal RIGHT basilar atelectasis. Lungs otherwise clear. No pleural effusion or pneumothorax. No acute osseous findings. IMPRESSION: Minimal RIGHT basilar atelectasis. Electronically Signed   By: Ulyses Southward M.D.   On: 04/26/2017 15:47    Assessment and Plan:   Unstable angina  ? PAF-flutter  PVD- s/p bilateral CEA   HTN   HLD  Smoking/COPD  CRI-3  Plan:  Heparin and NTG. Add low dose beta blocker, monitor. Plan cath tomorrow. Hydrate tonight.  Check echo for LVF (avoid LV gram with CRI).   Severity of  Illness: The appropriate patient status for this patient is OBSERVATION. Observation status is judged to be reasonable and necessary in order to provide the required intensity of service to ensure the patient's safety. The patient's presenting symptoms, physical exam findings, and initial radiographic and laboratory data in the context of their medical condition is felt to place them at decreased risk for further clinical deterioration. Furthermore, it is anticipated that the patient will be medically stable for discharge from the hospital within 2 midnights of admission. The following factors support the patient status of observation.   " The patient's presenting symptoms include chest pain. " The physical exam findings include COPD. " The initial radiographic and laboratory data are COPD.     For questions or updates, please contact [facility] Please consult [email or web address] for contact info under Cardiology/STEMI.    Signed, Corine Shelter, PA-C  04/26/2017 5:10 PM   Personally seen and examined. Agree with above.  77 year old male with carotid artery disease status post bilateral carotid endarterectomy, smoker since the age of 15 here with symptoms of unstable angina possibly precipitated originally by an episode of tachycardia in the 140s, possibly atrial flutter with underlying right bundle branch block.  On exam, regular rate and rhythm soft systolic murmur right upper sternal border, lungs with distant breath sounds bilaterally but no wheezes abdomen is soft extremities no edema 2+ radial pulse, bilateral carotid endarterectomy scars noted.  Lab work reviewed, negative troponin x1 thus far.  EKGs personally reviewed as above.  Unstable angina - He has been experiencing chest discomfort with exertional activities.  He is still quite active, Education administrator, redoes floors.  This episode occurred after having an argument with his daughter over the phone.  I think given his history of carotid artery  disease we should proceed with cardiac catheterization.  Risks and benefits discussed including stroke heart attack death renal impairment.  His creatinine is slightly elevated at 1.36 today and we will gently hydrate.  He is not on an ACE inhibitor or angiotensin receptor blocker.  Excellent radial pulse.  We will place him  on heparin IV as well as nitroglycerin drip.  It is quite possible that he has severe underlying CAD.  Last nuclear stress test however was in 2012 and was low risk with no ischemia.  Tobacco use -He is not ready to quit.  He has been smoking since the age of 44 and he states that since he does not cough he does not want to quit.  Encourage cessation.  Carotid artery disease -Continue with secondary prevention.  Aspirin, statin, beta-blocker.  Check echocardiogram.  Possible atrial flutter on original EKG -This is not absolutely conclusive.  Continue to monitor on telemetry.  Obviously if atrial flutter returns or atrial fibrillation occurs, he requires anticoagulation lifelong.  For now continue with IV heparin.  Donato Schultz, MD

## 2017-04-26 NOTE — ED Triage Notes (Signed)
Patient presents from home with complaints of substernal non radiating chest pain. Per EMS patient's EKG depression Leads 4-6. Patient given 1 nitro  BP went from  150's to  107. Patient alert and oriented on arrival. Patient HR 140's with EMS

## 2017-04-26 NOTE — ED Notes (Signed)
Pt reports he felt that he had indigestion today, attempted to burp with no relief. Pt also reports feeling nervous

## 2017-04-26 NOTE — Progress Notes (Signed)
ANTICOAGULATION CONSULT NOTE - Initial Consult  Pharmacy Consult for heparin Indication: chest pain/ACS  No Known Allergies  Patient Measurements: Height: 5\' 8"  (172.7 cm) Weight: 157 lb (71.2 kg) IBW/kg (Calculated) : 68.4 Heparin Dosing Weight: 71.2kg  Vital Signs: Temp: 97.5 F (36.4 C) (01/14 1431) Temp Source: Oral (01/14 1431) BP: 146/69 (01/14 1900) Pulse Rate: 69 (01/14 1900)  Labs: Recent Labs    04/26/17 1439  HGB 15.2  HCT 44.6  PLT 208  CREATININE 1.36*    Estimated Creatinine Clearance: 44 mL/min (A) (by C-G formula based on SCr of 1.36 mg/dL (H)).   Medical History: Past Medical History:  Diagnosis Date  . Arthritis   . Carotid artery occlusion   . Carotid stenosis 09/24/2011   R ICA patent w/ hx of endarterectomy, stenosis 1-39%;  L ICA patent w/ hx of endarterectomy; external carotids appear patent; see imaging tab for full report  . Chest heaviness 02/25/2009   PVCs, atrial bigeminy  . Claudication (HCC) 06/17/2011   LE doppler - bilateral ABIs normal values at rest; R CIA >50% diameter reduction L CIA 0-49% reduction; bilateral SFAs mild/mod mixed density plaque throughout suggesting 50-69% diameter reduction  . GERD (gastroesophageal reflux disease)   . Hyperlipidemia   . Hypertension 02/13/2010   echo - EF >55%; mild mitral annular calcification; mild aortic valve sclerosis  . RBBB (right bundle branch block) 02/13/2010   R/P MV - EF 67%; normal perfusion all regions; no significant wall abnormalties noted    Medications:  Infusions:  . heparin    . nitroGLYCERIN      Assessment: 76 yom presented to the ED with CP. To start IV heparin for unstable angina. Baseline CBC is WNL and he is not on anticoagulation PTA. Planning on cardiac cath tomorrow.   Goal of Therapy:  Heparin level 0.3-0.7 units/ml Monitor platelets by anticoagulation protocol: Yes   Plan:  Heparin bolus 4000 units IV x 1 Heparin gtt 900 units/hr Check an 8 hr heparin  level  Daily heparin level and CBC  Yandel Zeiner, Drake Leachachel Lynn 04/26/2017,9:38 PM

## 2017-04-26 NOTE — ED Notes (Signed)
Heparin administration verified with Baird Lyonsasey, RN

## 2017-04-26 NOTE — ED Provider Notes (Addendum)
[facility] Provider Note   CSN: [id number] Arrival date & time: 04/26/17  1422     History   Chief Complaint Chief Complaint  Patient presents with  . Chest Pain    HPI Ralph Dawson is a 77 y.o. male.  HPI Complains of anterior chest pressure onset 1 hour prior to coming here.  EMS called code STEMI in the field which was canceled by cardiology upon his arrival.  He described his discomfort as pressure-like and indigestion.  He has had several episodes of chest pressure over recent days which is worse with walking and improved with rest.  No associated nausea shortness breath or sweatiness.  EMS treated patient with baby aspirin and 1 sublingual nitroglycerin which dropped patient's blood pressure from 150 to 107 systolic Past Medical History:  Diagnosis Date  . Arthritis   . Carotid artery occlusion   . Carotid stenosis 09/24/2011   R ICA patent w/ hx of endarterectomy, stenosis 1-39%;  L ICA patent w/ hx of endarterectomy; external carotids appear patent; see imaging tab for full report  . Chest heaviness 02/25/2009   PVCs, atrial bigeminy  . Claudication (HCC) 06/17/2011   LE doppler - bilateral ABIs normal values at rest; R CIA >50% diameter reduction L CIA 0-49% reduction; bilateral SFAs mild/mod mixed density plaque throughout suggesting 50-69% diameter reduction  . GERD (gastroesophageal reflux disease)   . Hyperlipidemia   . Hypertension 02/13/2010   echo - EF >55%; mild mitral annular calcification; mild aortic valve sclerosis  . RBBB (right bundle branch block) 02/13/2010   R/P MV - EF 67%; normal perfusion all regions; no significant wall abnormalties noted    Patient Active Problem List   Diagnosis Date Noted  . Tobacco abuse 08/04/2014  . Right bundle branch block 01/26/2014  . HTN (hypertension) 01/24/2013  . Hyperlipidemia with target LDL less than 70 01/24/2013  . Carotid stenosis 09/24/2011    Past Surgical  History:  Procedure Laterality Date  . APPENDECTOMY    . ARTERIAL BYPASS SURGRY    . CAROTID ENDARTERECTOMY  07-15-2009   Right CEA  . CAROTID ENDARTERECTOMY  12-13-2007   Left CEA       Home Medications    Prior to Admission medications   Medication Sig Start Date End Date Taking? Authorizing Provider  aspirin 81 MG tablet Take 81 mg by mouth daily.    [provider]  donepezil (ARICEPT) 5 MG tablet Take 1 tablet by mouth daily. 11/20/13   [provider]  DULoxetine (CYMBALTA) 60 MG capsule Take 60 mg by mouth daily.    [provider]  isosorbide mononitrate (IMDUR) 30 MG 24 hr tablet TAKE 1 TABLET DAILY 03/26/14   Lennette BihariKelly, Thomas A, MD  oxyCODONE-acetaminophen (PERCOCET/ROXICET) 5-325 MG tablet Take 1 tablet by mouth every 6 (six) hours as needed. 04/19/16   [provider]  pantoprazole (PROTONIX) 40 MG tablet Take 1 tablet by mouth daily. 01/18/14   [provider]  pravastatin (PRAVACHOL) 80 MG tablet Take 80 mg by mouth daily.    [provider]  tamsulosin (FLOMAX) 0.4 MG CAPS Take 0.4 mg by mouth daily.    [provider]    Family History Family History  Problem Relation Age of Onset  . Cancer Mother        stomach  . Cancer Sister   . Diabetes Brother   . Cancer Daughter     Social History Social History  Tobacco Use  . Smoking status: Current Every Day Smoker    Packs/day: 0.50    Types: Cigarettes  . Smokeless tobacco: Never Used  Substance Use Topics  . Alcohol use: No  . Drug use: No     Allergies   Patient has no known allergies.   Review of Systems Review of Systems  Constitutional: Negative.   HENT: Negative.   Respiratory: Negative.   Cardiovascular: Positive for chest pain.  Gastrointestinal: Negative.   Musculoskeletal: Positive for myalgias.       Leg pain with walking, frequent muscle cramps  Skin: Negative.   Neurological: Negative.   Psychiatric/Behavioral: Negative.     All other systems reviewed and are negative.    Physical Exam Updated Vital Signs BP (!) 150/81   Pulse 73   Temp (!) 97.5 F (36.4 C) (Oral)   Resp 17   SpO2 99%   Physical Exam  Constitutional: He appears well-developed and well-nourished.  HENT:  Head: Normocephalic and atraumatic.  Eyes: Conjunctivae are normal. Pupils are equal, round, and reactive to light.  Neck: Neck supple. No tracheal deviation present. No thyromegaly present.  Cardiovascular: Normal rate and regular rhythm.  No murmur heard. Pulmonary/Chest: Effort normal and breath sounds normal.  Abdominal: Soft. Bowel sounds are normal. He exhibits no distension. There is no tenderness.  Musculoskeletal: Normal range of motion. He exhibits no edema or tenderness.  Neurological: He is alert. Coordination normal.  Skin: Skin is warm and dry. No rash noted.  Psychiatric: He has a normal mood and affect.  Nursing note and vitals reviewed.    ED Treatments / Results  Labs (all labs ordered are listed, but only abnormal results are displayed) Labs Reviewed  CBC  BASIC METABOLIC PANEL  I-STAT TROPONIN, ED    EKG  EKG Interpretation  Date/Time:  Monday April 26 2017 14:28:25 EST Ventricular Rate:  145 PR Interval:    QRS Duration: 132 QT Interval:  337 QTC Calculation: 524 R Axis:   176 Text Interpretation:  Wide-QRS tachycardia Atrial flutter SINCE LAST TRACING HEART RATE HAS INCREASED RBBB and LPFB Baseline wander in lead(s) V3 Confirmed by Doug Sou [phone]) on 04/26/2017 3:23:11 PM      ED ECG REPORT   Date: 04/26/2017  Rate: 75  Rhythm: normal sinus rhythm  QRS Axis: left  Intervals: normal  ST/T Wave abnormalities: nonspecific T wave changes  Conduction Disutrbances:right bundle branch block  Narrative Interpretation:   Old EKG Reviewed: Rates lower than tracing earlier today.  Right bundle branch block new over 2001  I have personally reviewed the EKG tracing and agree with the  computerized printout as noted. Radiology No results found.  Procedures Procedures (including critical care time)  Medications Ordered in ED Medications  nitroGLYCERIN (NITROSTAT) SL tablet 0.4 mg (not administered)    Results for orders placed or performed during the hospital encounter of 04/26/17  Basic metabolic panel  Result Value Ref Range   Sodium 138 135 - 145 mmol/L   Potassium 3.8 3.5 - 5.1 mmol/L   Chloride 106 101 - 111 mmol/L   CO2 23 22 - 32 mmol/L   Glucose, Bld 106 (H) 65 - 99 mg/dL   BUN 14 6 - 20 mg/dL   Creatinine, Ser 6.04 (H) 0.61 - 1.24 mg/dL   Calcium 8.9 8.9 - 54.0 mg/dL   GFR calc non Af Amer 49 (L) >60 mL/min   GFR calc Af Amer 56 (L) >60 mL/min   Anion gap 9  5 - 15  CBC  Result Value Ref Range   WBC 7.7 4.0 - 10.5 K/uL   RBC 4.74 4.22 - 5.81 MIL/uL   Hemoglobin 15.2 13.0 - 17.0 g/dL   HCT 16.1 09.6 - 04.5 %   MCV 94.1 78.0 - 100.0 fL   MCH 32.1 26.0 - 34.0 pg   MCHC 34.1 30.0 - 36.0 g/dL   RDW 40.9 81.1 - 91.4 %   Platelets 208 150 - 400 K/uL  I-stat troponin, ED  Result Value Ref Range   Troponin i, poc 0.00 0.00 - 0.08 ng/mL   Comment 3          Chest x-ray viewed by me Dg Chest Port 1 View  Result Date: 04/26/2017 CLINICAL DATA:  Substernal non radiating chest pain, EKG depression leads in 4-6, history hypertension, RIGHT bundle branch block, hyperlipidemia, smoker EXAM: PORTABLE CHEST 1 VIEW COMPARISON:  Portable exam 1523 hours compared to 11/11/2011 FINDINGS: External pacing lead projects over LEFT lung. Normal heart size, mediastinal contours, and pulmonary vascularity. Atherosclerotic calcification aorta. Minimal RIGHT basilar atelectasis. Lungs otherwise clear. No pleural effusion or pneumothorax. No acute osseous findings. IMPRESSION: Minimal RIGHT basilar atelectasis. Electronically Signed   By: Ulyses Southward M.D.   On: 04/26/2017 15:47   Initial Impression / Assessment and Plan / ED Course  I have reviewed the triage vital signs and  the nursing notes.  Pertinent labs & imaging results that were available during my care of the patient were reviewed by me and considered in my medical decision making (see chart for details).   Patient's rhythm converted spontaneously from atrial flutter with 2-1 block to normal sinus rhythm.  3:10 PM continues to complain of mild anterior chest pressure.  Sublingual nitroglycerin ordered. 4:20 PM patient asymptomatic and pain-free.  He  did not get any sublingual nitroglycerin.  Concern for unstable angina.  Leg pain with exertion consistent with claudication which is long-standing.  Today's episode of tachycardia exacerbated angina however he does have exertional chest pain. Dr Anne Fu from cardiology service consulted and will evaluate patient in the ED.  I counseled patient for 5 minutes on smoking cessation Final Clinical Impressions(s) / ED Diagnoses  Diagnoses #1 atrial flutter with rapid ventricular response #2 exertional chest pain #3 tobacco abuse #4 renal insufficiency Final diagnoses:  None    ED Discharge Orders    None       Doug Sou, MD 04/26/17 1642    Doug Sou, MD 04/26/17 1643    Doug Sou, MD 04/26/17 1649

## 2017-04-26 NOTE — ED Notes (Signed)
Confirmed with Cristina GongVedre, MD to hold nitroglycerin infusion. Pt denying active CP and BP not meeting parameters.

## 2017-04-27 ENCOUNTER — Observation Stay (HOSPITAL_BASED_OUTPATIENT_CLINIC_OR_DEPARTMENT_OTHER): Payer: BLUE CROSS/BLUE SHIELD

## 2017-04-27 ENCOUNTER — Encounter (HOSPITAL_COMMUNITY)
Admission: EM | Disposition: A | Discharge status: Home / Self Care | Payer: Self-pay | Source: Home / Self Care | Attending: Cardiovascular Disease

## 2017-04-27 ENCOUNTER — Other Ambulatory Visit: Payer: Self-pay

## 2017-04-27 ENCOUNTER — Encounter (HOSPITAL_COMMUNITY): Payer: Self-pay | Admitting: General Practice

## 2017-04-27 DIAGNOSIS — I214 Non-ST elevation (NSTEMI) myocardial infarction: Secondary | ICD-10-CM | POA: Diagnosis not present

## 2017-04-27 DIAGNOSIS — I739 Peripheral vascular disease, unspecified: Secondary | ICD-10-CM | POA: Diagnosis not present

## 2017-04-27 DIAGNOSIS — R079 Chest pain, unspecified: Secondary | ICD-10-CM

## 2017-04-27 DIAGNOSIS — I4892 Unspecified atrial flutter: Secondary | ICD-10-CM | POA: Diagnosis not present

## 2017-04-27 DIAGNOSIS — N179 Acute kidney failure, unspecified: Secondary | ICD-10-CM | POA: Diagnosis not present

## 2017-04-27 HISTORY — PX: LEFT HEART CATH AND CORONARY ANGIOGRAPHY: CATH118249

## 2017-04-27 HISTORY — PX: CORONARY ANGIOPLASTY WITH STENT PLACEMENT: SHX49

## 2017-04-27 HISTORY — PX: CORONARY STENT INTERVENTION: CATH118234

## 2017-04-27 LAB — TROPONIN I
TROPONIN I: 0.19 ng/mL — AB (ref <0.03)
TROPONIN I: 0.21 ng/mL — AB (ref <0.03)
Troponin I: 0.11 ng/mL (ref <0.03)

## 2017-04-27 LAB — ECHOCARDIOGRAM COMPLETE
HEIGHTINCHES: 68 in
Weight: 2512 oz

## 2017-04-27 LAB — CBC
HCT: 45.6 % (ref 39.0–52.0)
Hemoglobin: 15.8 g/dL (ref 13.0–17.0)
MCH: 32.2 pg (ref 26.0–34.0)
MCHC: 34.6 g/dL (ref 30.0–36.0)
MCV: 93.1 fL (ref 78.0–100.0)
PLATELETS: 205 10*3/uL (ref 150–400)
RBC: 4.9 MIL/uL (ref 4.22–5.81)
RDW: 12.8 % (ref 11.5–15.5)
WBC: 7.9 10*3/uL (ref 4.0–10.5)

## 2017-04-27 LAB — COMPREHENSIVE METABOLIC PANEL
ALT: 14 U/L — ABNORMAL LOW (ref 17–63)
ANION GAP: 10 (ref 5–15)
AST: 23 U/L (ref 15–41)
Albumin: 3.5 g/dL (ref 3.5–5.0)
Alkaline Phosphatase: 80 U/L (ref 38–126)
BUN: 13 mg/dL (ref 6–20)
CO2: 23 mmol/L (ref 22–32)
Calcium: 8.7 mg/dL — ABNORMAL LOW (ref 8.9–10.3)
Chloride: 108 mmol/L (ref 101–111)
Creatinine, Ser: 1.25 mg/dL — ABNORMAL HIGH (ref 0.61–1.24)
GFR, EST NON AFRICAN AMERICAN: 54 mL/min — AB (ref >60)
Glucose, Bld: 86 mg/dL (ref 65–99)
POTASSIUM: 3.9 mmol/L (ref 3.5–5.1)
Sodium: 141 mmol/L (ref 135–145)
TOTAL PROTEIN: 6.2 g/dL — AB (ref 6.5–8.1)
Total Bilirubin: 1.5 mg/dL — ABNORMAL HIGH (ref 0.3–1.2)

## 2017-04-27 LAB — HEPARIN LEVEL (UNFRACTIONATED)
HEPARIN UNFRACTIONATED: 0.12 [IU]/mL — AB (ref 0.30–0.70)
Heparin Unfractionated: 0.4 IU/mL (ref 0.30–0.70)

## 2017-04-27 LAB — LIPID PANEL
CHOLESTEROL: 121 mg/dL (ref 0–200)
HDL: 40 mg/dL — ABNORMAL LOW (ref >40)
LDL Cholesterol: 66 mg/dL (ref 0–99)
TRIGLYCERIDES: 74 mg/dL (ref <150)
Total CHOL/HDL Ratio: 3 RATIO
VLDL: 15 mg/dL (ref 0–40)

## 2017-04-27 LAB — POCT ACTIVATED CLOTTING TIME
Activated Clotting Time: 318 seconds
Activated Clotting Time: 356 seconds

## 2017-04-27 LAB — PROTIME-INR
INR: 1.08
PROTHROMBIN TIME: 13.9 s (ref 11.4–15.2)

## 2017-04-27 SURGERY — LEFT HEART CATH AND CORONARY ANGIOGRAPHY
Anesthesia: LOCAL

## 2017-04-27 MED ORDER — TICAGRELOR 90 MG PO TABS
90.0000 mg | ORAL_TABLET | Freq: Two times a day (BID) | ORAL | Status: DC
  Administered 2017-04-28: 90 mg via ORAL
  Filled 2017-04-27: qty 1

## 2017-04-27 MED ORDER — NITROGLYCERIN 1 MG/10 ML FOR IR/CATH LAB
INTRA_ARTERIAL | Status: DC | PRN
  Administered 2017-04-27: 200 ug via INTRACORONARY

## 2017-04-27 MED ORDER — HEPARIN (PORCINE) IN NACL 2-0.9 UNIT/ML-% IJ SOLN
INTRAMUSCULAR | Status: DC | PRN
  Administered 2017-04-27: 1000 mL via INTRA_ARTERIAL

## 2017-04-27 MED ORDER — HEPARIN SODIUM (PORCINE) 1000 UNIT/ML IJ SOLN
INTRAMUSCULAR | Status: AC
  Filled 2017-04-27: qty 1

## 2017-04-27 MED ORDER — IOPAMIDOL (ISOVUE-370) INJECTION 76%
INTRAVENOUS | Status: DC | PRN
  Administered 2017-04-27: 95 mL via INTRA_ARTERIAL

## 2017-04-27 MED ORDER — HEPARIN SODIUM (PORCINE) 1000 UNIT/ML IJ SOLN
INTRAMUSCULAR | Status: DC | PRN
  Administered 2017-04-27: 2000 [IU] via INTRAVENOUS
  Administered 2017-04-27: 3500 [IU] via INTRAVENOUS
  Administered 2017-04-27: 5000 [IU] via INTRAVENOUS

## 2017-04-27 MED ORDER — ASPIRIN 81 MG PO CHEW: 81.0000 mg | CHEWABLE_TABLET | ORAL | Status: DC

## 2017-04-27 MED ORDER — TICAGRELOR 90 MG PO TABS
ORAL_TABLET | ORAL | Status: AC
  Filled 2017-04-27: qty 2

## 2017-04-27 MED ORDER — ALUM & MAG HYDROXIDE-SIMETH 200-200-20 MG/5ML PO SUSP
30.0000 mL | ORAL | Status: DC | PRN
  Administered 2017-04-28: 30 mL via ORAL
  Filled 2017-04-27: qty 30

## 2017-04-27 MED ORDER — MIDAZOLAM HCL 2 MG/2ML IJ SOLN
INTRAMUSCULAR | Status: AC
  Filled 2017-04-27: qty 2

## 2017-04-27 MED ORDER — TIROFIBAN HCL IN NACL 5-0.9 MG/100ML-% IV SOLN
INTRAVENOUS | Status: AC
  Filled 2017-04-27: qty 100

## 2017-04-27 MED ORDER — ASPIRIN 81 MG PO CHEW
81.0000 mg | CHEWABLE_TABLET | ORAL | Status: AC
  Administered 2017-04-27: 81 mg via ORAL
  Filled 2017-04-27: qty 1

## 2017-04-27 MED ORDER — SODIUM CHLORIDE 0.9% FLUSH
3.0000 mL | Freq: Two times a day (BID) | INTRAVENOUS | Status: DC
  Administered 2017-04-27: 22:00:00 3 mL via INTRAVENOUS

## 2017-04-27 MED ORDER — ACETAMINOPHEN 325 MG PO TABS: 650.0000 mg | ORAL_TABLET | ORAL | Status: DC | PRN

## 2017-04-27 MED ORDER — HEPARIN (PORCINE) IN NACL 2-0.9 UNIT/ML-% IJ SOLN
INTRAMUSCULAR | Status: AC
  Filled 2017-04-27: qty 1000

## 2017-04-27 MED ORDER — HYDRALAZINE HCL 20 MG/ML IJ SOLN: 5.0000 mg | INTRAMUSCULAR | Status: AC | PRN

## 2017-04-27 MED ORDER — ONDANSETRON HCL 4 MG/2ML IJ SOLN: 4.0000 mg | Freq: Four times a day (QID) | INTRAMUSCULAR | Status: DC | PRN

## 2017-04-27 MED ORDER — MIDAZOLAM HCL 2 MG/2ML IJ SOLN
INTRAMUSCULAR | Status: DC | PRN
  Administered 2017-04-27: 2 mg via INTRAVENOUS
  Administered 2017-04-27: 1 mg via INTRAVENOUS

## 2017-04-27 MED ORDER — FENTANYL CITRATE (PF) 100 MCG/2ML IJ SOLN
INTRAMUSCULAR | Status: DC | PRN
  Administered 2017-04-27 (×2): 25 ug via INTRAVENOUS

## 2017-04-27 MED ORDER — LABETALOL HCL 5 MG/ML IV SOLN: 10.0000 mg | INTRAVENOUS | Status: AC | PRN

## 2017-04-27 MED ORDER — VERAPAMIL HCL 2.5 MG/ML IV SOLN
INTRAVENOUS | Status: DC | PRN
  Administered 2017-04-27: 10 mL via INTRA_ARTERIAL

## 2017-04-27 MED ORDER — LIDOCAINE HCL (PF) 1 % IJ SOLN
INTRAMUSCULAR | Status: AC
  Filled 2017-04-27: qty 30

## 2017-04-27 MED ORDER — FENTANYL CITRATE (PF) 100 MCG/2ML IJ SOLN
INTRAMUSCULAR | Status: AC
  Filled 2017-04-27: qty 2

## 2017-04-27 MED ORDER — SODIUM CHLORIDE 0.9 % IV SOLN
250.0000 mL | INTRAVENOUS | Status: DC | PRN
  Administered 2017-04-27 (×2): 250 mL via INTRAVENOUS

## 2017-04-27 MED ORDER — TIROFIBAN HCL IN NACL 5-0.9 MG/100ML-% IV SOLN
INTRAVENOUS | Status: DC | PRN
  Administered 2017-04-27: 0.075 ug/kg/min via INTRAVENOUS

## 2017-04-27 MED ORDER — VERAPAMIL HCL 2.5 MG/ML IV SOLN
INTRAVENOUS | Status: AC
  Filled 2017-04-27: qty 2

## 2017-04-27 MED ORDER — SODIUM CHLORIDE 0.9 % IV SOLN
INTRAVENOUS | Status: AC
  Administered 2017-04-27: 18:00:00 via INTRAVENOUS

## 2017-04-27 MED ORDER — SODIUM CHLORIDE 0.9 % IV SOLN
INTRAVENOUS | Status: DC
  Administered 2017-04-27: 11:00:00 via INTRAVENOUS

## 2017-04-27 MED ORDER — TIROFIBAN (AGGRASTAT) BOLUS VIA INFUSION
INTRAVENOUS | Status: DC | PRN
  Administered 2017-04-27: 1780 ug via INTRAVENOUS

## 2017-04-27 MED ORDER — METOPROLOL TARTRATE 25 MG PO TABS
25.0000 mg | ORAL_TABLET | Freq: Two times a day (BID) | ORAL | Status: DC
  Administered 2017-04-27 (×2): 25 mg via ORAL
  Filled 2017-04-27 (×3): qty 1

## 2017-04-27 MED ORDER — ASPIRIN 81 MG PO CHEW
81.0000 mg | CHEWABLE_TABLET | Freq: Every day | ORAL | Status: DC
  Administered 2017-04-28: 08:00:00 81 mg via ORAL
  Filled 2017-04-27: qty 1

## 2017-04-27 MED ORDER — SODIUM CHLORIDE 0.9% FLUSH: 3.0000 mL | INTRAVENOUS | Status: DC | PRN

## 2017-04-27 MED ORDER — IOPAMIDOL (ISOVUE-370) INJECTION 76%
INTRAVENOUS | Status: AC
  Filled 2017-04-27: qty 100

## 2017-04-27 MED ORDER — TICAGRELOR 90 MG PO TABS
ORAL_TABLET | ORAL | Status: DC | PRN
  Administered 2017-04-27: 180 mg via ORAL

## 2017-04-27 MED ORDER — ATORVASTATIN CALCIUM 80 MG PO TABS
80.0000 mg | ORAL_TABLET | Freq: Every day | ORAL | Status: DC
  Administered 2017-04-27: 80 mg via ORAL
  Filled 2017-04-27 (×2): qty 1

## 2017-04-27 MED ORDER — NITROGLYCERIN 1 MG/10 ML FOR IR/CATH LAB
INTRA_ARTERIAL | Status: AC
  Filled 2017-04-27: qty 10

## 2017-04-27 MED ORDER — SODIUM CHLORIDE 0.9 % IV SOLN: INTRAVENOUS | Status: DC

## 2017-04-27 MED ORDER — LIDOCAINE HCL (PF) 1 % IJ SOLN
INTRAMUSCULAR | Status: DC | PRN
  Administered 2017-04-27: 2 mL

## 2017-04-27 SURGICAL SUPPLY — 18 items
BALLN SAPPHIRE 2.5X12 (BALLOONS) ×2
BALLOON SAPPHIRE 2.5X12 (BALLOONS) ×1 IMPLANT
CATH 5FR JL3.5 JR4 ANG PIG MP (CATHETERS) ×2 IMPLANT
CATH LAUNCHER 6FR EBU 3 (CATHETERS) ×2 IMPLANT
DEVICE RAD COMP TR BAND LRG (VASCULAR PRODUCTS) ×2 IMPLANT
GLIDESHEATH SLEND SS 6F .021 (SHEATH) ×2 IMPLANT
GUIDELINER 6F (CATHETERS) ×2 IMPLANT
GUIDEWIRE INQWIRE 1.5J.035X260 (WIRE) ×1 IMPLANT
INQWIRE 1.5J .035X260CM (WIRE) ×2
KIT ENCORE 26 ADVANTAGE (KITS) ×2 IMPLANT
KIT HEART LEFT (KITS) ×2 IMPLANT
PACK CARDIAC CATHETERIZATION (CUSTOM PROCEDURE TRAY) ×2 IMPLANT
STENT SYNERGY DES 3.5X16 (Permanent Stent) ×2 IMPLANT
TRANSDUCER W/STOPCOCK (MISCELLANEOUS) ×2 IMPLANT
TUBING CIL FLEX 10 FLL-RA (TUBING) ×2 IMPLANT
VALVE GUARDIAN II ~~LOC~~ HEMO (MISCELLANEOUS) ×2 IMPLANT
WIRE ASAHI FIELDER XT 190CM (WIRE) ×2 IMPLANT
WIRE HI TORQ BMW 190CM (WIRE) ×2 IMPLANT

## 2017-04-27 NOTE — Progress Notes (Signed)
  Echocardiogram 2D Echocardiogram has been performed.  Ralph Dawson 04/27/2017, 11:09 AM

## 2017-04-27 NOTE — Progress Notes (Signed)
Progress Note  Patient Name: Ralph Dawson Date of Encounter: 04/27/2017  Primary Cardiologist: TK  Subjective   No CP, still in ER, no SOB  Inpatient Medications    Scheduled Meds: . aspirin EC  81 mg Oral Daily  . donepezil  10 mg Oral Daily  . DULoxetine  60 mg Oral Daily  . pravastatin  80 mg Oral q1800  . tamsulosin  0.4 mg Oral Daily   Continuous Infusions: . sodium chloride    . heparin 1,100 Units/hr (04/27/17 0659)  . nitroGLYCERIN Stopped (04/26/17 2357)   PRN Meds: acetaminophen, ipratropium, nitroGLYCERIN, ondansetron (ZOFRAN) IV   Vital Signs    Vitals:   04/27/17 0830 04/27/17 0845 04/27/17 0900 04/27/17 0945  BP: (!) 142/66 (!) 142/68 (!) 148/70 (!) 142/69  Pulse: (!) 57 (!) 59 (!) 59 61  Resp: 13 14 14 13   Temp:      TempSrc:      SpO2: 96% 97% 95% 96%  Weight:      Height:       No intake or output data in the 24 hours ending 04/27/17 1001 Filed Weights   04/26/17 2100  Weight: 157 lb (71.2 kg)    Telemetry    NSR - Personally Reviewed  ECG    AFlutter on first ECG, possible, Second SR, RRRR - Personally Reviewed  Physical Exam   GEN: No acute distress.   Neck: No JVD, B CEA scars Cardiac: RRR, no murmurs, rubs, or gallops.  Respiratory: Clear to auscultation bilaterally. GI: Soft, nontender, non-distended  MS: No edema; No deformity. 2+ radial  Neuro:  Nonfocal  Psych: Normal affect   Labs    Chemistry Recent Labs  Lab 04/26/17 1439 04/27/17 0237  NA 138 141  K 3.8 3.9  CL 106 108  CO2 23 23  GLUCOSE 106* 86  BUN 14 13  CREATININE 1.36* 1.25*  CALCIUM 8.9 8.7*  PROT  --  6.2*  ALBUMIN  --  3.5  AST  --  23  ALT  --  14*  ALKPHOS  --  80  BILITOT  --  1.5*  GFRNONAA 49* 54*  GFRAA 56* >60  ANIONGAP 9 10     Hematology Recent Labs  Lab 04/26/17 1439 04/27/17 0237  WBC 7.7 7.9  RBC 4.74 4.90  HGB 15.2 15.8  HCT 44.6 45.6  MCV 94.1 93.1  MCH 32.1 32.2  MCHC 34.1 34.6  RDW 13.1 12.8  PLT 208 205     Cardiac Enzymes Recent Labs  Lab 04/26/17 2334 04/27/17 0237  TROPONINI 0.21* 0.19*    Recent Labs  Lab 04/26/17 1456  TROPIPOC 0.00     BNPNo results for input(s): BNP, PROBNP in the last 168 hours.   DDimer No results for input(s): DDIMER in the last 168 hours.   Radiology    Dg Chest Port 1 View  Result Date: 04/26/2017 CLINICAL DATA:  Substernal non radiating chest pain, EKG depression leads in 4-6, history hypertension, RIGHT bundle branch block, hyperlipidemia, smoker EXAM: PORTABLE CHEST 1 VIEW COMPARISON:  Portable exam 1523 hours compared to 11/11/2011 FINDINGS: External pacing lead projects over LEFT lung. Normal heart size, mediastinal contours, and pulmonary vascularity. Atherosclerotic calcification aorta. Minimal RIGHT basilar atelectasis. Lungs otherwise clear. No pleural effusion or pneumothorax. No acute osseous findings. IMPRESSION: Minimal RIGHT basilar atelectasis. Electronically Signed   By: Ulyses Southward M.D.   On: 04/26/2017 15:47    Cardiac Studies   Cath pending  Patient Profile     77 y.o. male with NSTEMI, tob use, CEA B, possible aflutter parox, CKD 3  Assessment & Plan     - Cath today  - Hep IV. Will start metoprolol 25 BID  - Change prava to atorva 80  - NTG gtt  - Tob cessation  - ECHO ordered  - cardiac rehab will be needed  If flutter returns, anticoagulation.   For questions or updates, please contact [facility] Please consult [email or web address] for contact info under Cardiology/STEMI.      Signed, Donato SchultzMark Skains, MD  04/27/2017, 10:01 AM

## 2017-04-27 NOTE — Interval H&P Note (Signed)
Cath Lab Visit (complete for each Cath Lab visit)  Clinical Evaluation Leading to the Procedure:   ACS: Yes.    Non-ACS:    Anginal Classification: CCS IV  Anti-ischemic medical therapy: Minimal Therapy (1 class of medications)  Non-Invasive Test Results: No non-invasive testing performed  Prior CABG: No previous CABG      History and Physical Interval Note:  04/27/2017 4:04 PM  Ralph Dawson  has presented today for surgery, with the diagnosis of chest pain  The various methods of treatment have been discussed with the patient and family. After consideration of risks, benefits and other options for treatment, the patient has consented to  Procedure(s): LEFT HEART CATH AND CORONARY ANGIOGRAPHY (N/A) as a surgical intervention .  The patient's history has been reviewed, patient examined, no change in status, stable for surgery.  I have reviewed the patient's chart and labs.  Questions were answered to the patient's satisfaction.     Lance MussJayadeep Sayre Mazor

## 2017-04-27 NOTE — Progress Notes (Signed)
At 1740 upon patient's arrival, large hematoma noted at radial site, pressure held for 5 minutes, nurse transferring patient applied 1cc of air to TR Band. Coban applied due to hematoma slowly returning. Patient has low BP=94/38 HR=SB 48, 250 cc of NS bolus started. Repeat BP=100/42. 1815 BP=79/37 radial site shows no active bleeding and coban remains in place, radial site is soft to touch, another 250 cc bolus started. Ward Givenshris Berge NP notified and states to "Run another 500cc NS Bolus after 2nd 250cc NS Bolus is completed and continue to monitor." No other S/S of distress noted or complaints voiced at this time. Will continue to monitor. 1849 BP=100/49 HR=48.

## 2017-04-27 NOTE — ED Notes (Signed)
Tiffany, Diplomatic Services operational officersecretary to page MD. This RN to notify MD of critical troponin.

## 2017-04-27 NOTE — H&P (View-Only) (Signed)
Progress Note  Patient Name: Ralph Dawson Date of Encounter: 04/27/2017  Primary Cardiologist: TK  Subjective   No CP, still in ER, no SOB  Inpatient Medications    Scheduled Meds: . aspirin EC  81 mg Oral Daily  . donepezil  10 mg Oral Daily  . DULoxetine  60 mg Oral Daily  . pravastatin  80 mg Oral q1800  . tamsulosin  0.4 mg Oral Daily   Continuous Infusions: . sodium chloride    . heparin 1,100 Units/hr (04/27/17 0659)  . nitroGLYCERIN Stopped (04/26/17 2357)   PRN Meds: acetaminophen, ipratropium, nitroGLYCERIN, ondansetron (ZOFRAN) IV   Vital Signs    Vitals:   04/27/17 0830 04/27/17 0845 04/27/17 0900 04/27/17 0945  BP: (!) 142/66 (!) 142/68 (!) 148/70 (!) 142/69  Pulse: (!) 57 (!) 59 (!) 59 61  Resp: 13 14 14 13   Temp:      TempSrc:      SpO2: 96% 97% 95% 96%  Weight:      Height:       No intake or output data in the 24 hours ending 04/27/17 1001 Filed Weights   04/26/17 2100  Weight: 157 lb (71.2 kg)    Telemetry    NSR - Personally Reviewed  ECG    AFlutter on first ECG, possible, Second SR, RRRR - Personally Reviewed  Physical Exam   GEN: No acute distress.   Neck: No JVD, B CEA scars Cardiac: RRR, no murmurs, rubs, or gallops.  Respiratory: Clear to auscultation bilaterally. GI: Soft, nontender, non-distended  MS: No edema; No deformity. 2+ radial  Neuro:  Nonfocal  Psych: Normal affect   Labs    Chemistry Recent Labs  Lab 04/26/17 1439 04/27/17 0237  NA 138 141  K 3.8 3.9  CL 106 108  CO2 23 23  GLUCOSE 106* 86  BUN 14 13  CREATININE 1.36* 1.25*  CALCIUM 8.9 8.7*  PROT  --  6.2*  ALBUMIN  --  3.5  AST  --  23  ALT  --  14*  ALKPHOS  --  80  BILITOT  --  1.5*  GFRNONAA 49* 54*  GFRAA 56* >60  ANIONGAP 9 10     Hematology Recent Labs  Lab 04/26/17 1439 04/27/17 0237  WBC 7.7 7.9  RBC 4.74 4.90  HGB 15.2 15.8  HCT 44.6 45.6  MCV 94.1 93.1  MCH 32.1 32.2  MCHC 34.1 34.6  RDW 13.1 12.8  PLT 208 205     Cardiac Enzymes Recent Labs  Lab 04/26/17 2334 04/27/17 0237  TROPONINI 0.21* 0.19*    Recent Labs  Lab 04/26/17 1456  TROPIPOC 0.00     BNPNo results for input(s): BNP, PROBNP in the last 168 hours.   DDimer No results for input(s): DDIMER in the last 168 hours.   Radiology    Dg Chest Port 1 View  Result Date: 04/26/2017 CLINICAL DATA:  Substernal non radiating chest pain, EKG depression leads in 4-6, history hypertension, RIGHT bundle branch block, hyperlipidemia, smoker EXAM: PORTABLE CHEST 1 VIEW COMPARISON:  Portable exam 1523 hours compared to 11/11/2011 FINDINGS: External pacing lead projects over LEFT lung. Normal heart size, mediastinal contours, and pulmonary vascularity. Atherosclerotic calcification aorta. Minimal RIGHT basilar atelectasis. Lungs otherwise clear. No pleural effusion or pneumothorax. No acute osseous findings. IMPRESSION: Minimal RIGHT basilar atelectasis. Electronically Signed   By: Ulyses Southward M.D.   On: 04/26/2017 15:47    Cardiac Studies   Cath pending  Patient Profile     77 y.o. male with NSTEMI, tob use, CEA B, possible aflutter parox, CKD 3  Assessment & Plan     - Cath today  - Hep IV. Will start metoprolol 25 BID  - Change prava to atorva 80  - NTG gtt  - Tob cessation  - ECHO ordered  - cardiac rehab will be needed  If flutter returns, anticoagulation.   For questions or updates, please contact [facility] Please consult [email or web address] for contact info under Cardiology/STEMI.      Signed, Donato SchultzMark Tresean Mattix, MD  04/27/2017, 10:01 AM

## 2017-04-27 NOTE — Progress Notes (Signed)
ANTICOAGULATION CONSULT NOTE  Pharmacy Consult for heparin Indication: chest pain/ACS  No Known Allergies  Patient Measurements: Height: 5\' 8"  (172.7 cm) Weight: 157 lb (71.2 kg) IBW/kg (Calculated) : 68.4 Heparin Dosing Weight: 71.2kg  Vital Signs: BP: 146/70 (01/15 0600) Pulse Rate: 55 (01/15 0600)  Labs: Recent Labs    04/26/17 1439 04/26/17 2334 04/27/17 0237 04/27/17 0546  HGB 15.2  --  15.8  --   HCT 44.6  --  45.6  --   PLT 208  --  205  --   LABPROT  --   --   --  13.9  INR  --   --   --  1.08  HEPARINUNFRC  --   --   --  0.12*  CREATININE 1.36*  --  1.25*  --   TROPONINI  --  0.21* 0.19*  --     Estimated Creatinine Clearance: 47.9 mL/min (A) (by C-G formula based on SCr of 1.25 mg/dL (H)).   Medical History: Past Medical History:  Diagnosis Date  . Arthritis   . Carotid artery occlusion   . Carotid stenosis 09/24/2011   R ICA patent w/ hx of endarterectomy, stenosis 1-39%;  L ICA patent w/ hx of endarterectomy; external carotids appear patent; see imaging tab for full report  . Chest heaviness 02/25/2009   PVCs, atrial bigeminy  . Claudication (HCC) 06/17/2011   LE doppler - bilateral ABIs normal values at rest; R CIA >50% diameter reduction L CIA 0-49% reduction; bilateral SFAs mild/mod mixed density plaque throughout suggesting 50-69% diameter reduction  . GERD (gastroesophageal reflux disease)   . Hyperlipidemia   . Hypertension 02/13/2010   echo - EF >55%; mild mitral annular calcification; mild aortic valve sclerosis  . RBBB (right bundle branch block) 02/13/2010   R/P MV - EF 67%; normal perfusion all regions; no significant wall abnormalties noted    Medications:  Infusions:  . sodium chloride    . heparin 900 Units/hr (04/26/17 2255)  . nitroGLYCERIN Stopped (04/26/17 2357)    Assessment: Ralph Dawson presented to the ED with CP. To start IV heparin for unstable angina. Baseline CBC is WNL and he is not on anticoagulation PTA. Planning on cardiac  cath tomorrow.   Goal of Therapy:  Heparin level 0.3-0.7 units/ml Monitor platelets by anticoagulation protocol: Yes   Plan:  Increase heparin to 1100 units/hr Level this afternoon Daily heparin level and CBC F/u plan for cath  Baldemar FridayMasters, Gracy Ehly M 04/27/2017,6:28 AM

## 2017-04-27 NOTE — Progress Notes (Signed)
ANTICOAGULATION CONSULT NOTE  Pharmacy Consult for heparin Indication: chest pain/ACS  No Known Allergies  Patient Measurements: Height: 5\' 8"  (172.7 cm) Weight: 157 lb (71.2 kg) IBW/kg (Calculated) : 68.4 Heparin Dosing Weight: 71.2kg  Vital Signs: BP: 151/73 (01/15 1500) Pulse Rate: 62 (01/15 1500)  Labs: Recent Labs    04/26/17 1439 04/26/17 2334 04/27/17 0237 04/27/17 0546 04/27/17 1004 04/27/17 1510  HGB 15.2  --  15.8  --   --   --   HCT 44.6  --  45.6  --   --   --   PLT 208  --  205  --   --   --   LABPROT  --   --   --  13.9  --   --   INR  --   --   --  1.08  --   --   HEPARINUNFRC  --   --   --  0.12*  --  0.40  CREATININE 1.36*  --  1.25*  --   --   --   TROPONINI  --  0.21* 0.19*  --  0.11*  --     Estimated Creatinine Clearance: 47.9 mL/min (A) (by C-G formula based on SCr of 1.25 mg/dL (H)).  Medications:  Infusions:  . sodium chloride 75 mL/hr at 04/27/17 1121  . heparin 1,100 Units/hr (04/27/17 0659)  . nitroGLYCERIN Stopped (04/26/17 2357)    Assessment: 76 yom presented to the ED with CP. To start IV heparin for unstable angina. Baseline CBC is WNL and he is not on anticoagulation PTA. Planning on cardiac cath today. Heparin level is now therapeutic.   Goal of Therapy:  Heparin level 0.3-0.7 units/ml Monitor platelets by anticoagulation protocol: Yes   Plan:  Continue heparin gtt 1100 units/hr Daily heparin level and CBC F/u cath plans  Zyionna Pesce, Drake LeachRachel Lynn 04/27/2017,3:52 PM

## 2017-04-27 NOTE — ED Notes (Signed)
Cristina GongVedre, MD responded to page and notified by this RN of critical troponin. No new orders at this time.

## 2017-04-28 DIAGNOSIS — N179 Acute kidney failure, unspecified: Secondary | ICD-10-CM | POA: Diagnosis not present

## 2017-04-28 DIAGNOSIS — I739 Peripheral vascular disease, unspecified: Secondary | ICD-10-CM | POA: Diagnosis not present

## 2017-04-28 DIAGNOSIS — I214 Non-ST elevation (NSTEMI) myocardial infarction: Secondary | ICD-10-CM | POA: Diagnosis not present

## 2017-04-28 DIAGNOSIS — Z720 Tobacco use: Secondary | ICD-10-CM | POA: Diagnosis not present

## 2017-04-28 DIAGNOSIS — I4892 Unspecified atrial flutter: Secondary | ICD-10-CM | POA: Diagnosis not present

## 2017-04-28 DIAGNOSIS — R079 Chest pain, unspecified: Secondary | ICD-10-CM | POA: Diagnosis not present

## 2017-04-28 LAB — CBC
HEMATOCRIT: 43.8 % (ref 39.0–52.0)
HEMATOCRIT: 41.4 % (ref 39.0–52.0)
HEMOGLOBIN: 13.7 g/dL (ref 13.0–17.0)
Hemoglobin: 14.8 g/dL (ref 13.0–17.0)
MCH: 31.8 pg (ref 26.0–34.0)
MCH: 31.6 pg (ref 26.0–34.0)
MCHC: 33.8 g/dL (ref 30.0–36.0)
MCHC: 33.1 g/dL (ref 30.0–36.0)
MCV: 94.2 fL (ref 78.0–100.0)
MCV: 95.6 fL (ref 78.0–100.0)
PLATELETS: 251 10*3/uL (ref 150–400)
Platelets: 215 10*3/uL (ref 150–400)
RBC: 4.65 MIL/uL (ref 4.22–5.81)
RBC: 4.33 MIL/uL (ref 4.22–5.81)
RDW: 13.1 % (ref 11.5–15.5)
RDW: 13.4 % (ref 11.5–15.5)
WBC: 11.6 10*3/uL — AB (ref 4.0–10.5)
WBC: 10.1 10*3/uL (ref 4.0–10.5)

## 2017-04-28 LAB — BASIC METABOLIC PANEL
ANION GAP: 9 (ref 5–15)
BUN: 19 mg/dL (ref 6–20)
CHLORIDE: 110 mmol/L (ref 101–111)
CO2: 19 mmol/L — AB (ref 22–32)
Calcium: 8.9 mg/dL (ref 8.9–10.3)
Creatinine, Ser: 1.51 mg/dL — ABNORMAL HIGH (ref 0.61–1.24)
GFR calc non Af Amer: 43 mL/min — ABNORMAL LOW (ref >60)
GFR, EST AFRICAN AMERICAN: 50 mL/min — AB (ref >60)
GLUCOSE: 118 mg/dL — AB (ref 65–99)
POTASSIUM: 4.1 mmol/L (ref 3.5–5.1)
Sodium: 138 mmol/L (ref 135–145)

## 2017-04-28 LAB — COMPREHENSIVE METABOLIC PANEL
ALBUMIN: 3.5 g/dL (ref 3.5–5.0)
ALT: 15 U/L — AB (ref 17–63)
AST: 25 U/L (ref 15–41)
Alkaline Phosphatase: 72 U/L (ref 38–126)
Anion gap: 7 (ref 5–15)
BILIRUBIN TOTAL: 1.5 mg/dL — AB (ref 0.3–1.2)
BUN: 18 mg/dL (ref 6–20)
CO2: 21 mmol/L — ABNORMAL LOW (ref 22–32)
Calcium: 8.6 mg/dL — ABNORMAL LOW (ref 8.9–10.3)
Chloride: 112 mmol/L — ABNORMAL HIGH (ref 101–111)
Creatinine, Ser: 1.42 mg/dL — ABNORMAL HIGH (ref 0.61–1.24)
GFR calc Af Amer: 53 mL/min — ABNORMAL LOW (ref >60)
GFR calc non Af Amer: 46 mL/min — ABNORMAL LOW (ref >60)
GLUCOSE: 89 mg/dL (ref 65–99)
POTASSIUM: 4.3 mmol/L (ref 3.5–5.1)
Sodium: 140 mmol/L (ref 135–145)
TOTAL PROTEIN: 6.5 g/dL (ref 6.5–8.1)

## 2017-04-28 LAB — BILIRUBIN, DIRECT: Bilirubin, Direct: 0.2 mg/dL (ref 0.1–0.5)

## 2017-04-28 MED ORDER — NITROGLYCERIN 0.4 MG SL SUBL: 0.4000 mg | SUBLINGUAL_TABLET | SUBLINGUAL | 2 refills | Status: DC | PRN

## 2017-04-28 MED ORDER — TICAGRELOR 90 MG PO TABS: 90.0000 mg | ORAL_TABLET | Freq: Two times a day (BID) | ORAL | 0 refills | Status: DC

## 2017-04-28 MED ORDER — ATORVASTATIN CALCIUM 80 MG PO TABS: 80.0000 mg | ORAL_TABLET | Freq: Every day | ORAL | 5 refills | Status: DC

## 2017-04-28 MED ORDER — STUDY - AEGIS II STUDY - PLACEBO OR CSL112 (PI-HILTY)
170.0000 mL | INTRAVENOUS | Status: DC
  Administered 2017-04-28: 170 mL via INTRAVENOUS
  Filled 2017-04-28: qty 170

## 2017-04-28 MED ORDER — ANGIOPLASTY BOOK
Freq: Once | Status: AC
  Administered 2017-04-28: 04:00:00
  Filled 2017-04-28: qty 1

## 2017-04-28 MED ORDER — LOPERAMIDE HCL 2 MG PO CAPS
4.0000 mg | ORAL_CAPSULE | Freq: Once | ORAL | Status: AC
  Administered 2017-04-28: 04:00:00 4 mg via ORAL
  Filled 2017-04-28: qty 2

## 2017-04-28 MED ORDER — SODIUM CHLORIDE 0.9 % IV SOLN
INTRAVENOUS | Status: DC
  Administered 2017-04-28: 11:00:00 via INTRAVENOUS

## 2017-04-28 MED ORDER — ASPIRIN 81 MG PO CHEW: 81.0000 mg | CHEWABLE_TABLET | Freq: Every day | ORAL | Status: DC

## 2017-04-28 MED ORDER — TICAGRELOR 90 MG PO TABS: 90.0000 mg | ORAL_TABLET | Freq: Two times a day (BID) | ORAL | 10 refills | Status: DC

## 2017-04-28 NOTE — Research (Signed)
Inclusions:   Y N       '[x]'$        '[]'$    Male or male at least 78 years of age      '[x]'$        '[]'$    Evidence of type I (spontaneous) MI as defined by the following:      '[x]'$        '[]'$    a. Detection of a rise and/or fall in Troponin I or T with at least 1 value about the 99% upper reference limit.      '[x]'$        '[]'$     (AND)---  Any 1 or more of the following:       '[x]'$        '[]'$    - symptoms of ischemia (ie, resulting from a primary coronary    artery event)      '[]'$        '[]'$    - New or presumably new significant ST/T wave changes or left bundle branch block.      '[]'$        '[]'$         - Development of pathological Q waves on EKG      '[]'$         '[]'$    - Imaging evidence of new loss or viable myocardium or regional wall motion abnormality.      '[]'$        '[]'$    - ID of intracoronary thrombus by angiography.      '[x]'$        '[]'$    No suspicion of acute kidney injury at least 12 hours after angiography OR after first medical contract for subject's not undergoing angiography There must be documented evidence of stable renal function defined as no more than an increase in Serum Creatinine < 0.'3mg'$ /dl from pre-contrast serum creatinine value.  (Before _1.25____   12 hrs after ____1.42____)      '[x]'$        '[]'$    Evidence of multi-vessel coronary artery disease defined as:      '[x]'$        '[]'$    A. At least 50% stenosis on >1 epicardial artery or left main artery on catherization performed during the index hospitalization.      '[]'$        '[]'$    B. Prior cardiac catherization with at least 50% stenosis on >1 epicardial artery or left main artery.      '[]'$        '[]'$    C. Prior PCI and evidence of at least 50% stenosis of at least 1 epicardial artery different from prior revascularized artery.      '[]'$        '[]'$    D. Prior multivessel coronary artery bypass grafting.      '[x]'$        '[]'$    At least 1 of the following established risk factors:      '[x]'$        '[]'$    o Age ? 65 years      '[]'$        '[]'$     o Prior history of MI      '[]'$        '[]'$    o On pharmacological treatment for diabetes mellitus      '[]'$        '[]'$    o Peripheral arterial disease defined as meeting at least 1 of the following criteria:      '[]'$        '[]'$            +  Current intermittent claudication or resting limb ischemia and ABI    0.90      '[]'$        '[]'$            +   History of peripheral revascularization (surgical or percutaneous)      '[]'$        '[]'$            +  History of limb amputation due to PAD      '[]'$        '[]'$            +  Angiographic evidence (using computed tomographic angiography, MRA, or invasive angiography or a peripheral artery stenosis ?50%.      '[]'$        '[]'$    Male subjects must be post-meopausal or with a negative urine                                      pregnancy test prior to randomization. If the urine test cannot be confirmed as negative, a serum pregnancy test will be required. Pregnancy test must be negative.       '[x]'$        '[]'$    Investigator believes that the subject is willing and able to adhere to all protocol requirements.   X     '[x]'$        '[]'$    Willing to not participate in another investigational study until completion of their final study visit.     Exclusions:  Y N       '[]'$         '[x]'$    If these are the reason for MI (pt is excluded)      '[]'$        '[x]'$    1. Myocardial necrosis due mismatch between myocardial oxygen demand and supply, usually due to fixed coronary disease with increased demand leading to MI      '[]'$        '[x]'$    2. Cardiac death due to MI      '[]'$        '[x]'$    3. Myocardial necrosis due to complications from a PCI      '[]'$        '[x]'$    4. Myocardial necrosis due to stent thrombosis      '[]'$        '[x]'$    5. Myocardial necrosis due to in stent restenosis as the only etiology      '[]'$        '[x]'$    6. Myocardial necrosis in the stenting of coronary artery bypass grafting      '[]'$        '[x]'$    Ongoing hemodynamic instability      '[]'$        '[x]'$         +   History of NYHA Class III or IV heart failure within the last year      '[]'$        '[x]'$         +  [place] Class III or IV heart failure      '[]'$        '[x]'$         +  Sustained and/or symptomatic hypotension (SBP <90 mm HG)      '[]'$        '[x]'$         +  Known left  ventricular ejection fraction of <30%      _0        _1    Evidence of hepatobiliary disease as indicated by any 1 or more of the   following at screening:      _2        _3         +  Current active hepatic dysfunction or active biliary obstruction      _4        _5         +  Chronic or prior history of cirrhosis or of infectious / inflammatory hepatitis      _6        _7         + Hepatic lab abnormalities: ALT > 3 x ULN or Total bilirubin > 2x ULN at randomization.      _8        _9    Severe chronic kidney disease (eGFR of <69m) or on dialysis      _10        _11    Plan to undergo scheduled coronary artery bypass graft surgery after randomization, as determined at the time of screening      _12        _13    Known history of allergies to soybeans, peanuts, albumin      _14        _15    A known history of IgA deficiency or antibodies to IgA      _16        _17    A comorbid condition with an estimated life expectancy of ? 6 months at time of consent      _18        _19    Women who are pregnant or breastfeeding at time of randomization      _20        _21    Participated in another interventional clinical study at the time of consent      _22        _23    Treatment with anticancer therapy      _24        _25    Previously randomized or participated in this study or [facility]

## 2017-04-28 NOTE — Care Management Note (Addendum)
Case Management Note  Patient Details  Name: Ralph Dawson MRN: [id number] Date of Birth: 03-Mar-1941  Subjective/Objective:  From home, s/p  Coronary stent intervention, will be on brilinta, NCM awaiting benefit check .     1/17 [place], BSN - Patient wife states they only paid 29.00 for their medication at [facility] in [place].     3. S/W EMILY @ [facility] # [phone]    PATIENT TO UPDATE INS INFO   BRILINTA  90 MG BID   COVER- YES  CO-PAY- $ 104.09  TIER- 2 DRUG  PRIOR APPROVAL- NO  DEDUCTIBLE: NOT MET   PREFERRED PHARMACY : [facility]             Action/Plan: NCM will follow for dc needs.   Expected Discharge Date:                  Expected Discharge Plan:  Home/Self Care  In-House Referral:     Discharge planning Services  CM Consult  Post Acute Care Choice:    Choice offered to:     DME Arranged:    DME Agency:     HH Arranged:    [place] Agency:     Status of Service:  Completed, signed off  If discussed at [employer] of Stay Meetings, dates discussed:    Additional Comments:  Zenon Mayo, RN 04/28/2017, 9:31 AM

## 2017-04-28 NOTE — Research (Signed)
AEGIS II Informed Consent   Subject Name: Ralph Dawson  Subject met inclusion and exclusion criteria.  The informed consent form, study requirements and expectations were reviewed with the subject and questions and concerns were addressed prior to the signing of the consent form.  The subject verbalized understanding of the trail requirements.  The subject agreed to participate in the AEGIS II trial and signed the informed consent.  The informed consent was obtained prior to performance of any protocol-specific procedures for the subject.  A copy of the signed informed consent was given to the subject and a copy was placed in the subject's medical record.  Hedrick,Antonio Creswell W 04/28/2017, 11:45

## 2017-04-28 NOTE — Progress Notes (Signed)
Progress Note  Patient Name: Ralph Dawson Date of Encounter: 04/28/2017  Primary Cardiologist: Nicki Guadalajara, MD   Subjective   No chest pain, however he has multiple complaints. Nausea, diarrhea, dark stools, intermittent dyspnea/ fatigue. His right arm is feeling better.   Inpatient Medications    Scheduled Meds: . aspirin  81 mg Oral Daily  . atorvastatin  80 mg Oral q1800  . donepezil  10 mg Oral Daily  . DULoxetine  60 mg Oral Daily  . metoprolol tartrate  25 mg Oral BID  . sodium chloride flush  3 mL Intravenous Q12H  . tamsulosin  0.4 mg Oral Daily  . ticagrelor  90 mg Oral BID   Continuous Infusions: . sodium chloride Stopped (04/27/17 1900)  . nitroGLYCERIN Stopped (04/26/17 2357)   PRN Meds: sodium chloride, acetaminophen, alum & mag hydroxide-simeth, ipratropium, nitroGLYCERIN, ondansetron (ZOFRAN) IV, sodium chloride flush   Vital Signs    Vitals:   04/27/17 2351 04/28/17 0545 04/28/17 0600 04/28/17 0726  BP: (!) 124/53 (!) 95/52 (!) 106/44 124/65  Pulse: (!) 54 (!) 49 (!) 50 (!) 56  Resp: 20 13 15 20   Temp: 97.7 F (36.5 C) 97.8 F (36.6 C)  97.8 F (36.6 C)  TempSrc: Oral Oral  Oral  SpO2: 100% 96% 99% 100%  Weight:  154 lb 14.4 oz (70.3 kg)    Height:        Intake/Output Summary (Last 24 hours) at 04/28/2017 0841 Last data filed at 04/27/2017 1900 Gross per 24 hour  Intake 1291.33 ml  Output 700 ml  Net 591.33 ml   Filed Weights   04/26/17 2100 04/28/17 0545  Weight: 157 lb (71.2 kg) 154 lb 14.4 oz (70.3 kg)    Telemetry    Sinus bradycardia, upper 40s low 50s - Personally Reviewed  ECG    Sinus brady, RBBB - Personally Reviewed  Physical Exam   GEN: No acute distress.   Neck: No JVD Cardiac: RR, slow rate, no murmurs, rubs, or gallops.  Respiratory: Clear to auscultation bilaterally. GI: Soft, nontender, non-distended  MS: No edema; No deformity. Neuro:  Nonfocal  Psych: Normal affect   Labs    Chemistry Recent Labs    Lab 04/26/17 1439 04/27/17 0237 04/28/17 0338  NA 138 141 138  K 3.8 3.9 4.1  CL 106 108 110  CO2 23 23 19*  GLUCOSE 106* 86 118*  BUN 14 13 19   CREATININE 1.36* 1.25* 1.51*  CALCIUM 8.9 8.7* 8.9  PROT  --  6.2*  --   ALBUMIN  --  3.5  --   AST  --  23  --   ALT  --  14*  --   ALKPHOS  --  80  --   BILITOT  --  1.5*  --   GFRNONAA 49* 54* 43*  GFRAA 56* >60 50*  ANIONGAP 9 10 9      Hematology Recent Labs  Lab 04/26/17 1439 04/27/17 0237 04/28/17 0338  WBC 7.7 7.9 11.6*  RBC 4.74 4.90 4.65  HGB 15.2 15.8 14.8  HCT 44.6 45.6 43.8  MCV 94.1 93.1 94.2  MCH 32.1 32.2 31.8  MCHC 34.1 34.6 33.8  RDW 13.1 12.8 13.1  PLT 208 205 251    Cardiac Enzymes Recent Labs  Lab 04/26/17 2334 04/27/17 0237 04/27/17 1004  TROPONINI 0.21* 0.19* 0.11*    Recent Labs  Lab 04/26/17 1456  TROPIPOC 0.00     BNPNo results for input(s): BNP, PROBNP  in the last 168 hours.   DDimer No results for input(s): DDIMER in the last 168 hours.   Radiology    Dg Chest Port 1 View  Result Date: 04/26/2017 CLINICAL DATA:  Substernal non radiating chest pain, EKG depression leads in 4-6, history hypertension, RIGHT bundle branch block, hyperlipidemia, smoker EXAM: PORTABLE CHEST 1 VIEW COMPARISON:  Portable exam 1523 hours compared to 11/11/2011 FINDINGS: External pacing lead projects over LEFT lung. Normal heart size, mediastinal contours, and pulmonary vascularity. Atherosclerotic calcification aorta. Minimal RIGHT basilar atelectasis. Lungs otherwise clear. No pleural effusion or pneumothorax. No acute osseous findings. IMPRESSION: Minimal RIGHT basilar atelectasis. Electronically Signed   By: Ulyses SouthwardMark  Boles M.D.   On: 04/26/2017 15:47    Cardiac Studies   Procedures   CORONARY STENT INTERVENTION  LEFT HEART CATH AND CORONARY ANGIOGRAPHY  Conclusion     Prox LAD lesion is 40% stenosed.  Ost Cx to Prox Cx lesion is 25% stenosed.  Mid LAD lesion is 50% stenosed.  The left  ventricular systolic function is normal.  LV end diastolic pressure is normal.  The left ventricular ejection fraction is 55-65% by visual estimate.  There is no aortic valve stenosis.  Mid Cx lesion is 95% stenosed.  A drug-eluting stent was successfully placed using a STENT SYNERGY DES 3.5X16.  Post intervention, there is a 0% residual stenosis.   Culprit circumflex lesion stented.  Double wire was needed to get balloon around proximal bend.  Guideliner was needed to get the stent around the proximal bend.  Would use EBU 3.5 Guide if cath needed in the future as EBU 3 did not provide great support.    COntinue DAPT for 1 year along with aggressive secondary prevention including smoking cessation.     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 LHC showed 95% mid LCx lesion, s/p PCI + DES.   Assessment & Plan    1. NSTEMI/CAD: troponin peaked at 0.21. Culprit lesion on cath noted to be a 95% stenosed mid LCx, successfully treated with PCI + DES. There was also mild-moderate nonobstructive CAD in the LAD, 40-50% disease, treated medically. LVEF normal at 55-65%. Plan for DAPT with ASA and Brilinta x 1 year. Also continue statin. We will d/c BB given bradycardia.   2. Lipids: LDL is controlled at 66 mg/dL. Lipitor added to regimen. Monitor hepatic function in 8 weeks.   3. AKI: spike in SCr post cath from 1.25>>1.51. Contrast used during cath was 95 cc. Also suspect diarrhea contributing. Will hydrate with IVFs. F/u BMP.  4. Leukocytosis: WBC 11.6 today, up from 7.9 yesterday. He is afebrile. No subjective fever. ? Reactive. Will monitor.   5. Sinus Bradycardia: metoprolol was added yesterday. HR currently in the upper 40s, low 50s sinus. We will d/c metoprolol. Monitor on tele.   6. Diarrhea/ "Dark Stools": H/H stable. Will monitor throughout  the day.   For questions or updates, please contact [facility] Please consult [email or web address] for contact info under Cardiology/STEMI.      Signed, Robbie LisBrittainy Simmons, PA-C  04/28/2017, 8:41 AM    Personally seen and examined. Agree with above.  77 year old with non-ST elevation myocardial infarction with coronary artery disease status post circumflex DES currently on dual antiplatelet therapy.  Has been a lifelong smoker since the age of 89.  COPD.  Seems intermittently anxious  this morning.  At times he will feel short of breath, somewhat agitated.  No changes in oxygen saturation.  He has been somewhat bradycardic and we will stop his metoprolol 25 mg twice a day which was started yesterday.  I think some of this may be anxiety related from being here in the hospital under this setting.  His white count has increased slightly.  He has had some diarrhea.  We will continue to hydrate him.  His creatinine is slightly elevated today to 1.51.  If labs this afternoon are reasonable and he is feeling better, it may not be unreasonable to discharge him home to a more comfortable environment.  Ultimately, we want to make sure that he is doing well for discharge.  Donato Schultz, MD

## 2017-04-28 NOTE — Progress Notes (Signed)
[id number] Pt walked with NT without CP so did not walk with pt. MI education completed with pt and wife who voiced understanding. Discussed importance of brilinta with stent. To see case manager re brilinta. Discussed smoking cessation and gave fake cigarettes to pt and wife who both are going to try to quit. Gave smoking cessation handout. Reviewed NTG use, MI restrictions, ex ed and gave heart healthy diet. Referring to [place] CRP 2. Luetta NuttingCharlene Jeshawn Melucci RN BSN 04/28/2017 10:22 AM

## 2017-04-28 NOTE — Discharge Summary (Signed)
Discharge Summary    Patient ID: Ralph Dawson,  MRN: [id number], DOB/AGE: 11-24-1940 77 y.o.  Admit date: 04/26/2017 Discharge date: 04/28/2017  Primary Care Provider: Joette Catching Primary Cardiologist: Nicki Guadalajara, MD  Discharge Diagnoses    Principal Problem:   Chest pain with moderate risk of acute coronary syndrome Active Problems:   Carotid stenosis   HTN (hypertension)   Hyperlipidemia with target LDL less than 70   Right bundle branch block   Tobacco abuse   Chronic renal insufficiency, stage 3 (moderate) (HCC)   Chest pain   Non-ST elevation (NSTEMI) myocardial infarction (HCC)   Allergies No Known Allergies  Diagnostic Studies/Procedures    Procedures   CORONARY STENT INTERVENTION  LEFT HEART CATH AND CORONARY ANGIOGRAPHY 04/27/17  Conclusion     Prox LAD lesion is 40% stenosed.  Ost Cx to Prox Cx lesion is 25% stenosed.  Mid LAD lesion is 50% stenosed.  The left ventricular systolic function is normal.  LV end diastolic pressure is normal.  The left ventricular ejection fraction is 55-65% by visual estimate.  There is no aortic valve stenosis.  Mid Cx lesion is 95% stenosed.  A drug-eluting stent was successfully placed using a STENT SYNERGY DES 3.5X16.  Post intervention, there is a 0% residual stenosis.  Culprit circumflex lesion stented. Double wire was needed to get balloon around proximal bend. Guideliner was needed to get the stent around the proximal bend. Would use EBU 3.5 Guide if cath needed in the future as EBU 3 did not provide great support.   COntinue DAPT for 1 year along with aggressive secondary prevention including smoking cessation.    2D Echo 04/27/17  Study Conclusions  - Left ventricle: The cavity size was normal. Wall thickness was   normal. Systolic function was normal. The estimated ejection   fraction was in the range of 60% to 65%. Wall motion was normal;   there were no regional wall motion  abnormalities. Doppler   parameters are consistent with abnormal left ventricular   relaxation (grade 1 diastolic dysfunction). - Aortic valve: There was no stenosis. - Aorta: Borderline dilated aortic root. Aortic root dimension: 38   mm (ED). - Mitral valve: Mildly calcified annulus. There was no significant   regurgitation. Valve area by pressure half-time: 2.29 cm^2. - Right ventricle: The cavity size was normal. Systolic function   was normal. - Tricuspid valve: Peak RV-RA gradient (S): 27 mm Hg. - Pulmonary arteries: PA peak pressure: 30 mm Hg (S). - Inferior vena cava: The vessel was normal in size. The   respirophasic diameter changes were in the normal range (>= 50%),   consistent with normal central venous pressure.  Impressions:  - Normal LV size with EF 60-65%. Normal RV size and systolic   function. No significant valvular abnormalities.   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   Hospital Course     Pt admitted for NSTEMI. Troponin peaked at 0.21. Culprit lesion on cath noted to be a 95% stenosed mid LCx, successfully treated with PCI + DES. There was also mild-moderate nonobstructive CAD in the LAD, 40-50% disease, treated medically. LVEF normal at 55-65%. Pt tolerated procedure well and had no recurrent CP. He was placed on DAPT w/ ASA and Brilinta. He did not tolerate BB due to bradycardia.  Metoprolol was tried but discontinued when HR dropped in the upper 40s. HR improved after BB was d/ced. Statin therapy with Lipitor also initiated. LDL was 66 mg/dL.   Day after PCI, pt complained of mild diarrhea and feeling tired. Post cath labs showed slight bump in SCr from 1.25>>1.51. Only 95 cc of contrast was used during cath. His AKI was felt likely due to fluid loss from diarrhea. He was hydrated with IVFs. BMP was  rechecked and SCr showed downward trend, down to 1.4. Pt felt better after fluids and denied any recurrent diarrhea. He ambulated with cardiac rehab w/o any exertional symptoms. Cath site and vital signs were stable. Dr. Anne FuSkains felt that he was stable for discharge home. Hospital f/u will be arranged with Dr. Tresa EndoKelly or an APP on his team in 1-2 weeks.   Consultants: none    Discharge Vitals Blood pressure (!) 140/42, pulse (!) 58, temperature 98.1 F (36.7 C), temperature source Oral, resp. rate 16, height 5\' 8"  (1.727 m), weight 154 lb 14.4 oz (70.3 kg), SpO2 100 %.  Filed Weights   04/26/17 2100 04/28/17 0545  Weight: 157 lb (71.2 kg) 154 lb 14.4 oz (70.3 kg)    Labs & Radiologic Studies    CBC Recent Labs    04/28/17 0338 04/28/17 1314  WBC 11.6* 10.1  HGB 14.8 13.7  HCT 43.8 41.4  MCV 94.2 95.6  PLT 251 215   Basic Metabolic Panel Recent Labs    16/01/9600/14/19 1840  04/28/17 0338 04/28/17 1314  NA  --    < > 138 140  K  --    < > 4.1 4.3  CL  --    < > 110 112*  CO2  --    < > 19* 21*  GLUCOSE  --    < > 118* 89  BUN  --    < > 19 18  CREATININE  --    < > 1.51* 1.42*  CALCIUM  --    < > 8.9 8.6*  MG 2.1  --   --   --    < > = values in this interval not displayed.   Liver Function Tests Recent Labs    04/27/17 0237 04/28/17 1314  AST 23 25  ALT 14* 15*  ALKPHOS 80 72  BILITOT 1.5* 1.5*  PROT 6.2* 6.5  ALBUMIN 3.5 3.5   No results for input(s): LIPASE, AMYLASE in the last 72 hours. Cardiac Enzymes Recent Labs    04/26/17 2334 04/27/17 0237 04/27/17 1004  TROPONINI 0.21* 0.19* 0.11*   BNP Invalid input(s): POCBNP D-Dimer No results for input(s): DDIMER in the last 72 hours. Hemoglobin A1C No results for input(s): HGBA1C in the last 72 hours. Fasting Lipid Panel Recent Labs    04/27/17 0237  CHOL 121  HDL 40*  LDLCALC 66  TRIG 74  CHOLHDL 3.0   Thyroid Function Tests Recent Labs    04/26/17 1840  TSH 2.253   _____________  Dg Chest  Port 1 View  Result Date: 04/26/2017 CLINICAL DATA:  Substernal non radiating chest pain, EKG depression leads in 4-6, history hypertension, RIGHT bundle branch block, hyperlipidemia, smoker EXAM: PORTABLE CHEST 1 VIEW COMPARISON:  Portable exam 1523 hours compared to 11/11/2011 FINDINGS: External pacing lead projects over LEFT lung. Normal heart size, mediastinal contours, and pulmonary vascularity. Atherosclerotic calcification aorta. Minimal RIGHT basilar atelectasis. Lungs otherwise clear. No pleural effusion or pneumothorax. No acute osseous findings. IMPRESSION: Minimal RIGHT basilar atelectasis. Electronically  Signed   By: Ulyses Southward M.D.   On: 04/26/2017 15:47   Disposition   Pt is being discharged home today in good condition.  Follow-up Plans & Appointments    Follow-up Information    Lennette Bihari, MD Follow up.   Specialty:  Cardiology Why:  our office will call you with a hospital follow-up visit in 1-2 weeks Contact information: [address] [place] [address] [phone]          Discharge Instructions    Amb Referral to Cardiac Rehabilitation   Complete by:  As directed    Diagnosis:   NSTEMI Coronary Stents        Discharge Medications   Allergies as of 04/28/2017   No Known Allergies     Medication List    STOP taking these medications   aspirin 325 MG tablet Replaced by:  aspirin 81 MG chewable tablet   pravastatin 80 MG tablet Commonly known as:  PRAVACHOL     TAKE these medications   acetaminophen 500 MG tablet Commonly known as:  TYLENOL Take 1,000 mg by mouth every 6 (six) hours as needed for moderate pain.   aspirin 81 MG chewable tablet Chew 1 tablet (81 mg total) by mouth daily. Start taking on:  04/29/2017 Replaces:  aspirin 325 MG tablet   atorvastatin 80 MG tablet Commonly known as:  LIPITOR Take 1 tablet (80 mg total) by mouth daily at 6 PM.   donepezil 10 MG tablet Commonly known as:  ARICEPT Take 10  mg by mouth daily.   DULoxetine 60 MG capsule Commonly known as:  CYMBALTA Take 60 mg by mouth daily.   ipratropium 0.06 % nasal spray Commonly known as:  ATROVENT Place 1 spray into the nose 3 (three) times daily as needed.   isosorbide mononitrate 30 MG 24 hr tablet Commonly known as:  IMDUR TAKE 1 TABLET DAILY   MUSCLE RUB 10-15 % Crea Apply 1 application topically as needed for muscle pain.   nitroGLYCERIN 0.4 MG SL tablet Commonly known as:  NITROSTAT Place 1 tablet (0.4 mg total) under the tongue every 5 (five) minutes as needed for chest pain.   ranitidine 150 MG capsule Commonly known as:  ZANTAC Take 150 mg by mouth 2 (two) times daily.   tamsulosin 0.4 MG Caps capsule Commonly known as:  FLOMAX Take 0.4 mg by mouth daily.   ticagrelor 90 MG Tabs tablet Commonly known as:  BRILINTA Take 1 tablet (90 mg total) by mouth 2 (two) times daily.   ticagrelor 90 MG Tabs tablet Commonly known as:  BRILINTA Take 1 tablet (90 mg total) by mouth 2 (two) times daily.       Aspirin prescribed at discharge?  Yes High Intensity Statin Prescribed? (Lipitor 40-80mg  or Crestor 20-40mg ): Yes Beta Blocker Prescribed? No: bradycardia For EF <40%, was ACEI/ARB Prescribed? No: EF >40% ADP Receptor Inhibitor Prescribed? (i.e. Plavix etc.-Includes Medically Managed Patients): Yes For EF <40%, Aldosterone Inhibitor Prescribed? No: EF > 40% Was EF assessed during THIS hospitalization? Yes Was Cardiac Rehab II ordered? (Included Medically managed Patients): Yes   Outstanding Labs/Studies   None   Duration of Discharge Encounter   Greater than 30 minutes including physician time.  Signed, Robbie Lis PA-C 04/28/2017, 3:24 PM  Personally seen and examined. Agree with above.  77 year old with non-ST elevation myocardial infarction, circumflex DES, lifelong smoker since age 65.   - loose stools improved. Labs repeat stable. OK with DC home  to more comfortable environment.    - note, transient SOB was noted prior to Brilinta.   Donato Schultz, MD

## 2017-04-29 ENCOUNTER — Encounter (HOSPITAL_COMMUNITY): Payer: Self-pay | Admitting: Interventional Cardiology

## 2017-04-30 ENCOUNTER — Telehealth (HOSPITAL_COMMUNITY): Payer: Self-pay

## 2017-04-30 NOTE — Research (Signed)
                                  "  CONSENT"   YES     NO   Continuing further Investigational Product and study visits for follow-up?      X     Continuing consent from future biomedical research      X                                      "EVENTS"    YES     NO  AE   (IF YES SEE SOURCE)        X  SAE  (IF YES SEE SOURCE)        X  ENDPOINT   (IF YES SEE SOURCE)        X  REVASCULARIZATION  (IF YES SEE SOURCE)        X  AMPUTATION   (IF YES SEE SOURCE)        X  TROPONIN'S  (IF YES SEE SOURCE)        X     Health Questionnaire      English version for the [place]    By placing a checkmark in one box in each group below, please indicate which statements best describe your own health state today.     Mobility   I have no problems in walking about X  I have some problems in walking about ?  I am confined to bed ?     Self-Care   I have no problems with self-care X  I have some problems washing or dressing myself ?  I am unable to wash or dress myself ?     Usual Activities (e.g. work, study, housework, family or leisure activities)   I have no problems with performing my usual activities X  I have some problems with performing my usual activities ?  I am unable to perform my usual activities ?     Pain / Discomfort   I have no pain or discomfort X  I have moderate pain or discomfort ?  I have extreme pain or discomfort ?     Anxiety / Depression   I am not anxious or depressed X  I am moderately anxious or depressed ?  I am extremely anxious or depressed ?   To help people say how good or bad a health state is, we have drawn a scale (rather like a thermometer) on which the best state you can imagine is marked 100 and the worst state you can imagine is marked 0.    We would like you to indicate on this scale how good or bad your own health is today, in your opinion. Please do this by drawing a line from the box below to whichever point on the scale indicates how good or  bad your health state is today.   85    LABS ATTACHED:

## 2017-04-30 NOTE — Research (Signed)
Patient seen and clinical trial discussed.  Patient seen and examined.   AEGIS-II research study:  Patient seen an examined prior to randomization and infusion.  Physical Exam:  Bilateral carotid scars Clear to auscultation. Cardiac rhythm regular.  No peripheral edema. Decrease pedal pulses.  Otherwise unchanged from Dr. Anne FuSkains     [place] Class:  1  The patient was given the opportunity to ask any further questions about the study that were not addressed by the research nurse coordinators - he has no further questions and wants to proceed at this time.

## 2017-04-30 NOTE — Research (Signed)
DEMOGRAPHICS:  Patient Name: Ralph BowlGeorge Dawson Birth Date: 06/03/1940  Sex: Male  Race: white  Child Bearing: ? Yes    ? No ? Tubial ligation ? Hysterectomy  ? postmenopausal   Height: 5 ft 8 in Weight: 70.3   Index Procedure:  Onset date of symptoms: 14-Jan-19 Onset of symptoms: 1300  Date of First contact at hospital: 14-Jan-19 Time of first contact at hospital: 1430  Admission Date: 14-Jan-19   Discharge Date: 16-Jan-19 Discharge Time: 1900   Vital Signs: Date 04/28/2017    Time: 1600 Temp: 98.3  BP: 141/70  Pulse: 57    Concomitant medications: Every visit: ? See med sheet  BMP Pre Contrast IV 1.58  CMP Post Contrast IV 12 hours later: 1.25 Hepatic Panel:  ALT: 14 Total Bili: 1.5 Direct Bili: 0.2   Medical History:  ? CAD ? Prior MI ? PAD  ? History of Heart Failure ? Moderate to severe valvular dx ? AFib  ? Prior Coronary Revascularization  if YES please select Yes or No below:  CABG ? Yes   ? No           PCI with stent ? Yes   ? No          PCI without stent ? Yes ? No  ? CVA if checked please select one of the following Carotid Endarectomy  ? Hypertension  ? Gilberts syndrome ? CKD   ? Hypocholesteremia ? DM ? Smoker        ? eCigarette  [place] Class Stage 1     EQ-5D-3L ?  Future Biomedical Research: Consented ? Yes     ? No If yes please date they withdrew consent from biomedical research Click or tap to enter a date.  Central Labs Before Start of Infusion: ? Biochemistry panel      ? Hematology     ? Immunogenicity   (30 mins before infusion)   ? Parvovirus  ? FBR sample ? PK/PD sample Central Labs End of Infusion:   ? PK/PD Central Blood Draw Time: Before SOI: __1615________ After EOI: __1814_____ Infusion Start Time: 1621  Infusion End Time: [id number]

## 2017-04-30 NOTE — Telephone Encounter (Signed)
Patients insurance is active and benefits verified through [employer] - No co-pay, deductible amount of $500.00/$186.23 has been met, out of pocket amount of $3,000/$186.23 has been met, 20% co-insurance, and no pre-authorization is required. Passport/reference [phone]  Patient will be contacted and scheduled after their follow up appt with the Cardiologist office upon review by the RN Navigator.

## 2017-05-03 ENCOUNTER — Telehealth: Payer: Self-pay | Admitting: Cardiovascular Disease

## 2017-05-03 NOTE — Telephone Encounter (Signed)
Closed Encounter  °

## 2017-05-05 ENCOUNTER — Other Ambulatory Visit: Payer: Self-pay | Admitting: *Deleted

## 2017-05-05 ENCOUNTER — Encounter: Payer: BLUE CROSS/BLUE SHIELD | Admitting: *Deleted

## 2017-05-05 ENCOUNTER — Encounter (HOSPITAL_COMMUNITY)
Admission: RE | Admit: 2017-05-05 | Discharge: 2017-05-05 | Disposition: A | Discharge status: Home / Self Care | Payer: BLUE CROSS/BLUE SHIELD | Source: Ambulatory Visit | Attending: Internal Medicine | Admitting: Internal Medicine

## 2017-05-05 DIAGNOSIS — Z006 Encounter for examination for normal comparison and control in clinical research program: Secondary | ICD-10-CM

## 2017-05-05 LAB — COMPREHENSIVE METABOLIC PANEL
ALT: 19 U/L (ref 17–63)
ANION GAP: 11 (ref 5–15)
AST: 22 U/L (ref 15–41)
Albumin: 3.7 g/dL (ref 3.5–5.0)
Alkaline Phosphatase: 97 U/L (ref 38–126)
BUN: 14 mg/dL (ref 6–20)
CHLORIDE: 107 mmol/L (ref 101–111)
CO2: 21 mmol/L — AB (ref 22–32)
Calcium: 8.9 mg/dL (ref 8.9–10.3)
Creatinine, Ser: 1.37 mg/dL — ABNORMAL HIGH (ref 0.61–1.24)
GFR, EST AFRICAN AMERICAN: 56 mL/min — AB (ref >60)
GFR, EST NON AFRICAN AMERICAN: 48 mL/min — AB (ref >60)
Glucose, Bld: 107 mg/dL — ABNORMAL HIGH (ref 65–99)
Potassium: 4.3 mmol/L (ref 3.5–5.1)
SODIUM: 139 mmol/L (ref 135–145)
Total Bilirubin: 1.1 mg/dL (ref 0.3–1.2)
Total Protein: 6.4 g/dL — ABNORMAL LOW (ref 6.5–8.1)

## 2017-05-05 MED ORDER — STUDY - AEGIS II STUDY - PLACEBO OR CSL112 (PI-HILTY)
170.0000 mL | INTRAVENOUS | Status: DC
  Administered 2017-05-05: 11:00:00 170 mL via INTRAVENOUS
  Filled 2017-05-05: qty 170

## 2017-05-05 NOTE — Progress Notes (Signed)
Current Meds  Medication Sig  . acetaminophen (TYLENOL) 500 MG tablet Take 1,000 mg by mouth every 6 (six) hours as needed for moderate pain.  Marland Kitchen. aspirin 81 MG chewable tablet Chew 1 tablet (81 mg total) by mouth daily.  Marland Kitchen. atorvastatin (LIPITOR) 80 MG tablet Take 1 tablet (80 mg total) by mouth daily at 6 PM.  . donepezil (ARICEPT) 10 MG tablet Take 10 mg by mouth daily.   . DULoxetine (CYMBALTA) 60 MG capsule Take 60 mg by mouth daily.  Marland Kitchen. ipratropium (ATROVENT) 0.06 % nasal spray Place 1 spray into the nose 3 (three) times daily as needed.  . isosorbide mononitrate (IMDUR) 30 MG 24 hr tablet TAKE 1 TABLET DAILY  . Menthol-Methyl Salicylate (MUSCLE RUB) 10-15 % CREA Apply 1 application topically as needed for muscle pain.  . nitroGLYCERIN (NITROSTAT) 0.4 MG SL tablet Place 1 tablet (0.4 mg total) under the tongue every 5 (five) minutes as needed for chest pain.  . ranitidine (ZANTAC) 150 MG capsule Take 150 mg by mouth 2 (two) times daily.  . tamsulosin (FLOMAX) 0.4 MG CAPS Take 0.4 mg by mouth daily.  . ticagrelor (BRILINTA) 90 MG TABS tablet Take 1 tablet (90 mg total) by mouth 2 (two) times daily.                                       "CONSENT"   YES     NO   Continuing further Investigational Product and study visits for follow-up?      X     Continuing consent from future biomedical research      X                                      "EVENTS"    YES     NO  AE   (IF YES SEE SOURCE)        X  SAE  (IF YES SEE SOURCE)        X  ENDPOINT   (IF YES SEE SOURCE)        X  REVASCULARIZATION  (IF YES SEE SOURCE)        X  AMPUTATION   (IF YES SEE SOURCE)        X  TROPONIN'S  (IF YES SEE SOURCE)        X     Infusion given. No complaints of chest pain. Pt says he has some sob at times, don't last long. Will continue to monitor. Pt states he smoked his last cigarette this am. He is trying the electronic cigs. Local labs and central labs drawn today. Medications reviewed with patient and  updated in Epic.  Will see patient back next Wednesday for infusion #3.

## 2017-05-11 ENCOUNTER — Other Ambulatory Visit (HOSPITAL_COMMUNITY): Payer: Self-pay | Admitting: *Deleted

## 2017-05-12 ENCOUNTER — Encounter: Payer: BLUE CROSS/BLUE SHIELD | Admitting: *Deleted

## 2017-05-12 ENCOUNTER — Encounter (HOSPITAL_COMMUNITY)
Admission: RE | Admit: 2017-05-12 | Discharge: 2017-05-12 | Disposition: A | Discharge status: Home / Self Care | Payer: BLUE CROSS/BLUE SHIELD | Source: Ambulatory Visit | Attending: Internal Medicine | Admitting: Internal Medicine

## 2017-05-12 ENCOUNTER — Encounter: Payer: Self-pay | Admitting: Adult Health

## 2017-05-12 VITALS — BP 124/70 | HR 81 | Temp 98.3°F | Resp 14

## 2017-05-12 DIAGNOSIS — Z006 Encounter for examination for normal comparison and control in clinical research program: Secondary | ICD-10-CM

## 2017-05-12 MED ORDER — STUDY - AEGIS II STUDY - PLACEBO OR CSL112 (PI-HILTY)
170.0000 mL | INTRAVENOUS | Status: DC
  Administered 2017-05-12: 170 mL via INTRAVENOUS
  Filled 2017-05-12: qty 170

## 2017-05-12 NOTE — Progress Notes (Signed)
Pt came in for his visit 4. He is doing well. Going to see his cardiologist next week for his post PCI visit. He is still having some SOB but not worsening, and only when up walking. No complaints of chest pain. All medications are the same with no changes. He has quit smoking and only using e-cigarettes now. Central labs drawn and sent off.                                    "CONSENT"   YES     NO   Continuing further Investigational Product and study visits for follow-up?      X     Continuing consent from future biomedical research     X                                      "EVENTS"    YES     NO  AE   (IF YES SEE SOURCE)        X  SAE  (IF YES SEE SOURCE)        X  ENDPOINT   (IF YES SEE SOURCE)        X  REVASCULARIZATION  (IF YES SEE SOURCE)        X  AMPUTATION   (IF YES SEE SOURCE)        X  TROPONIN'S  (IF YES SEE SOURCE)        X    Allergies as of 05/12/2017   No Known Allergies     Medication List        Accurate as of 05/12/17  1:12 PM. Always use your most recent med list.          acetaminophen 500 MG tablet Commonly known as:  TYLENOL Take 1,000 mg by mouth every 6 (six) hours as needed for moderate pain.   aspirin 81 MG chewable tablet Chew 1 tablet (81 mg total) by mouth daily.   atorvastatin 80 MG tablet Commonly known as:  LIPITOR Take 1 tablet (80 mg total) by mouth daily at 6 PM.   donepezil 10 MG tablet Commonly known as:  ARICEPT Take 10 mg by mouth daily.   DULoxetine 60 MG capsule Commonly known as:  CYMBALTA Take 60 mg by mouth daily.   ipratropium 0.06 % nasal spray Commonly known as:  ATROVENT Place 1 spray into the nose 3 (three) times daily as needed.   isosorbide mononitrate 30 MG 24 hr tablet Commonly known as:  IMDUR TAKE 1 TABLET DAILY   MUSCLE RUB 10-15 % Crea Apply 1 application topically as needed for muscle pain.   nitroGLYCERIN 0.4 MG SL tablet Commonly known as:  NITROSTAT Place 1 tablet (0.4 mg total) under the  tongue every 5 (five) minutes as needed for chest pain.   ranitidine 150 MG capsule Commonly known as:  ZANTAC Take 150 mg by mouth 2 (two) times daily.   tamsulosin 0.4 MG Caps capsule Commonly known as:  FLOMAX Take 0.4 mg by mouth daily.   ticagrelor 90 MG Tabs tablet Commonly known as:  BRILINTA Take 1 tablet (90 mg total) by mouth 2 (two) times daily.

## 2017-05-14 DIAGNOSIS — I252 Old myocardial infarction: Secondary | ICD-10-CM | POA: Diagnosis not present

## 2017-05-14 DIAGNOSIS — H903 Sensorineural hearing loss, bilateral: Secondary | ICD-10-CM | POA: Diagnosis not present

## 2017-05-14 DIAGNOSIS — J300 Vasomotor rhinitis: Secondary | ICD-10-CM | POA: Diagnosis not present

## 2017-05-14 DIAGNOSIS — R42 Dizziness and giddiness: Secondary | ICD-10-CM | POA: Diagnosis not present

## 2017-05-18 ENCOUNTER — Encounter: Payer: Self-pay | Admitting: Adult Health

## 2017-05-18 ENCOUNTER — Ambulatory Visit (INDEPENDENT_AMBULATORY_CARE_PROVIDER_SITE_OTHER): Payer: BLUE CROSS/BLUE SHIELD | Admitting: Adult Health

## 2017-05-18 VITALS — BP 98/62 | HR 83 | Ht 68.0 in | Wt 157.8 lb

## 2017-05-18 DIAGNOSIS — I2510 Atherosclerotic heart disease of native coronary artery without angina pectoris: Secondary | ICD-10-CM

## 2017-05-18 DIAGNOSIS — I6523 Occlusion and stenosis of bilateral carotid arteries: Secondary | ICD-10-CM | POA: Diagnosis not present

## 2017-05-18 DIAGNOSIS — I10 Essential (primary) hypertension: Secondary | ICD-10-CM | POA: Diagnosis not present

## 2017-05-18 DIAGNOSIS — I2583 Coronary atherosclerosis due to lipid rich plaque: Secondary | ICD-10-CM | POA: Diagnosis not present

## 2017-05-18 DIAGNOSIS — I4510 Unspecified right bundle-branch block: Secondary | ICD-10-CM

## 2017-05-18 DIAGNOSIS — I739 Peripheral vascular disease, unspecified: Secondary | ICD-10-CM | POA: Diagnosis not present

## 2017-05-18 NOTE — Progress Notes (Signed)
Cardiology Office Note   Date:  05/18/2017   ID:  Ralph GoadGeorge E Dawson, DOB 08/14/1940, MRN [id number]  PCP:  Ralph CatchingNyland, Leonard, MD  Cardiologist: Ralph Guadalajarahomas Kelly, MD Chief Complaint  Patient presents with  . Hospitalization Follow-up  . Coronary Artery Disease     History of Present Illness: Ralph Dawson is a 77 y.o. male who presents for ongoing assessment and management of coronary artery disease, status post hospitalization for chest pain requiring cardiac catheterization.  (04/27/2017), Catheterization revealed a 95% mid circumflex lesion, with subsequent DES placement reducing stenosis to 0%.  The patient had other nonobstructive disease of the LAD (40%) ostial circumflex to proximal circumflex (25%) mid LAD disease 50%).  He was placed on dual antiplatelet therapy with Brilinta and aspirin for 1 year with aggressive secondary prevention including smoking cessation.   The patient quit smoking on 05/09/2017. He is medically compliant. He has been noticing that he is having symptoms of intermittent claudication especially on the right. He has been followed by Ralph Dawson the past for peripheral arterial disease and had prior testing, however he did not have occlusive disease 3 years ago. He is noticing discomfort in the discomfort in his legs a little more prominently.  He denies recurrent chest pain, dyspnea, melena, bleeding, or epistaxis.  Past Medical History:  Diagnosis Date  . Arthritis    "hands, feet" (04/27/2017)  . Carotid artery occlusion   . Carotid stenosis 09/24/2011   R ICA patent w/ hx of endarterectomy, stenosis 1-39%;  L ICA patent w/ hx of endarterectomy; external carotids appear patent; see imaging tab for full report  . Chest heaviness 02/25/2009   PVCs, atrial bigeminy  . Claudication (HCC) 06/17/2011   LE doppler - bilateral ABIs normal values at rest; R CIA >50% diameter reduction L CIA 0-49% reduction; bilateral SFAs mild/mod mixed density plaque throughout suggesting  50-69% diameter reduction  . GERD (gastroesophageal reflux disease)   . Hyperlipidemia   . Hypertension 02/13/2010   echo - EF >55%; mild mitral annular calcification; mild aortic valve sclerosis  . NSTEMI (non-ST elevated myocardial infarction) (HCC) 04/26/2017   Ralph Dawson/notes 04/27/2017  . RBBB (right bundle branch block) 02/13/2010   R/P MV - EF 67%; normal perfusion all regions; no significant wall abnormalties noted    Past Surgical History:  Procedure Laterality Date  . APPENDECTOMY    . ARTERIAL BYPASS SURGRY     pt unaware of this OR on 04/27/2017  . BACK SURGERY    . CAROTID ENDARTERECTOMY Right 07/15/2009   Right CEA  . CAROTID ENDARTERECTOMY Left 12/13/2007   Left CEA  . CORONARY ANGIOPLASTY WITH STENT PLACEMENT  04/27/2017  . CORONARY STENT INTERVENTION N/A 04/27/2017   Procedure: CORONARY STENT INTERVENTION;  Surgeon: Ralph Dawson, Ralph S, MD;  [facility];  Service: Cardiovascular;  Laterality: N/A;  . LAPAROSCOPIC CHOLECYSTECTOMY    . LEFT HEART CATH AND CORONARY ANGIOGRAPHY N/A 04/27/2017   Procedure: LEFT HEART CATH AND CORONARY ANGIOGRAPHY;  Surgeon: Ralph Dawson, Ralph S, MD;  [facility];  Service: Cardiovascular;  Laterality: N/A;  . LUMBAR DISC SURGERY    . TONSILLECTOMY       Current Outpatient Medications  Medication Sig Dispense Refill  . acetaminophen (TYLENOL) 500 MG tablet Take 1,000 mg by mouth every 6 (six) hours as needed for moderate pain.    Marland Kitchen. aspirin 81 MG chewable tablet Chew 1 tablet (81 mg total) by mouth daily.    Marland Kitchen. atorvastatin (LIPITOR) 80  MG tablet Take 1 tablet (80 mg total) by mouth daily at 6 PM. 30 tablet 5  . donepezil (ARICEPT) 10 MG tablet Take 10 mg by mouth daily.     . DULoxetine (CYMBALTA) 60 MG capsule Take 60 mg by mouth daily.    Marland Kitchen ipratropium (ATROVENT) 0.06 % nasal spray Place 1 spray into the nose 3 (three) times daily as needed.  6  . isosorbide mononitrate (IMDUR) 30 MG 24 hr tablet TAKE 1 TABLET  DAILY 90 tablet 3  . Menthol-Methyl Salicylate (MUSCLE RUB) 10-15 % CREA Apply 1 application topically as needed for muscle pain.    . nitroGLYCERIN (NITROSTAT) 0.4 MG SL tablet Place 1 tablet (0.4 mg total) under the tongue every 5 (five) minutes as needed for chest pain. 25 tablet 2  . ranitidine (ZANTAC) 150 MG capsule Take 150 mg by mouth 2 (two) times daily.    . tamsulosin (FLOMAX) 0.4 MG CAPS Take 0.4 mg by mouth daily.    . ticagrelor (BRILINTA) 90 MG TABS tablet Take 1 tablet (90 mg total) by mouth 2 (two) times daily. 60 tablet 10   No current facility-administered medications for this visit.     Allergies:   Patient has no known allergies.    Social History:  The patient  reports that he has been smoking cigarettes.  He has a 65.00 pack-year smoking history. he has never used smokeless tobacco. He reports that he does not drink alcohol or use drugs.   Family History:  The patient'Dawson family history includes Cancer in his daughter, mother, and sister; Diabetes in his brother.    ROS: All other systems are reviewed and negative. Unless otherwise mentioned in H&P    PHYSICAL EXAM: VS:  BP 98/62   Pulse 83   Ht 5\' 8"  (1.727 m)   Wt 157 lb 12.8 oz (71.6 kg)   BMI 23.99 kg/m  , BMI Body mass index is 23.99 kg/m. GEN: Well nourished, well developed, in no acute distress  HEENT: normal  Neck: no JVD, carotid bruits, or masses Cardiac: RRR; no murmurs, rubs, or gallops,no edema good peripheral pulses Respiratory:  clear to auscultation bilaterally, normal work of breathing GI: soft, nontender, nondistended, + BS MS: no deformity or atrophy  Skin: warm and dry, no rash Neuro:  Strength and sensation are intact Psych: euthymic mood, full affect   EKG:  Normal sinus rhythm with right bundle branch block. Heart rate of 83 bpm  Recent Labs: 04/26/2017: Magnesium 2.1; TSH 2.253 04/28/2017: Hemoglobin 13.7; Platelets 215 05/05/2017: ALT 19; BUN 14; Creatinine, Ser 1.37; Potassium  4.3; Sodium 139    Lipid Panel    Component Value Date/Time   CHOL 121 04/27/2017 0237   TRIG 74 04/27/2017 0237   HDL 40 (L) 04/27/2017 0237   CHOLHDL 3.0 04/27/2017 0237   VLDL 15 04/27/2017 0237   LDLCALC 66 04/27/2017 0237      Wt Readings from Last 3 Encounters:  05/18/17 157 lb 12.8 oz (71.6 kg)  05/05/17 154 lb (69.9 kg)  04/28/17 154 lb 14.4 oz (70.3 kg)      Other studies Reviewed: Conclusion 04/27/2017-cardiac catheterization    Prox LAD lesion is 40% stenosed.  Ost Cx to Prox Cx lesion is 25% stenosed.  Mid LAD lesion is 50% stenosed.  The left ventricular systolic function is normal.  LV end diastolic pressure is normal.  The left ventricular ejection fraction is 55-65% by visual estimate.  There is no aortic valve stenosis.  Mid Cx lesion is 95% stenosed.  A drug-eluting stent was successfully placed using a STENT SYNERGY DES 3.5X16.  Post intervention, there is a 0% residual stenosis.   Culprit circumflex lesion stented.  Double wire was needed to get balloon around proximal bend.  Guideliner was needed to get the stent around the proximal bend.  Would use EBU 3.5 Guide if cath needed in the future as EBU 3 did not provide great support.    COntinue DAPT for 1 year along with aggressive secondary prevention including smoking cessation.        ASSESSMENT AND PLAN:  1.  Coronary artery disease: Status post cardiac catheterization with 95% mid LAD stenosis status post PCI reducing stenosis to 0%. The patient has residual disease in the left anterior descending artery at 45% mid LAD, and 50% distal LAD. He is currently asymptomatic. He is taking his medications without any side effects. Brilinta is not causing significant dyspnea.  2. Symptoms of intermittent claudication: Known history of PAD. Has been followed by Dr. Tresa Endo in the past with Doppler ultrasound. At that time he did not have occlusive disease. The patient will have repeat ABIs with  Doppler for reevaluation of his current status with symptoms.  3. Tobacco abuse: The patient quit smoking 2 weeks ago. Both he and his wife have been undergoing smoking cessation program together. I have congratulated him on this lifestyle change.  4. Hypercholesterolemia: Continue statin therapy. Will have ongoing surveillance of status with follow-up labs every 6 month if not completed sooner by PCP.  Current medicines are reviewed at length with the patient today.    Labs/ tests ordered today include: None  Bettey Mare. Liborio Nixon, ANP, AACC   05/18/2017 12:27 PM    [facility] [address] [place], [place] [address] Phone: [phone]; Fax: [phone]

## 2017-05-18 NOTE — Patient Instructions (Addendum)
Medication Instructions:  NO CHANGES-Your physician recommends that you continue on your current medications as directed. Please refer to the Current Medication list given to you today.  If you need a refill on your cardiac medications before your next appointment, please call your pharmacy.  Testing/Procedures: Your physician has requested that you have an ankle brachial index (ABI). During this test an ultrasound and blood pressure cuff are used to evaluate the arteries that supply the arms and legs with blood. Allow thirty minutes for this exam. There are no restrictions or special instructions.  Your physician has requested that you have a lower extremity US duplex. During this test, exercise and ultrasound are used to evaluate arterial blood flow in the legs. Allow one hour for this exam. There are no restrictions or special instructions.  Follow-Up: Your physician wants you to follow-up in: 1ST AVAILABLE WITH DR Tresa EndoKELLY, WILL CALL WITH TESTING RESULTS.   Thank you for choosing [facility]!!

## 2017-05-19 ENCOUNTER — Encounter (HOSPITAL_COMMUNITY)
Admission: RE | Admit: 2017-05-19 | Discharge: 2017-05-19 | Disposition: A | Discharge status: Home / Self Care | Payer: BLUE CROSS/BLUE SHIELD | Source: Ambulatory Visit | Attending: Internal Medicine | Admitting: Internal Medicine

## 2017-05-19 ENCOUNTER — Encounter: Payer: BLUE CROSS/BLUE SHIELD | Admitting: *Deleted

## 2017-05-19 VITALS — BP 101/60 | HR 71

## 2017-05-19 DIAGNOSIS — Z006 Encounter for examination for normal comparison and control in clinical research program: Secondary | ICD-10-CM

## 2017-05-19 MED ORDER — STUDY - AEGIS II STUDY - PLACEBO OR CSL112 (PI-HILTY)
170.0000 mL | INTRAVENOUS | Status: DC
  Administered 2017-05-19: 170 mL via INTRAVENOUS
  Filled 2017-05-19: qty 170

## 2017-05-19 NOTE — Progress Notes (Signed)
Visit 5 infusion 4                                   "CONSENT"   YES     NO   Continuing further Investigational Product and study visits for follow-up?     x     Continuing consent from future biomedical research     x                                      "EVENTS"    YES     NO  AE   (IF YES SEE SOURCE)      x  SAE  (IF YES SEE SOURCE)      x  ENDPOINT   (IF YES SEE SOURCE)      x  REVASCULARIZATION  (IF YES SEE SOURCE)      x  AMPUTATION   (IF YES SEE SOURCE)      x  TROPONIN'S  (IF YES SEE SOURCE)      x   Vitals:   05/19/17 0925  BP: 101/60  Pulse: 71  SpO2: 99%    Medications reviewed and no changes.  Allergies as of 05/19/2017   No Known Allergies     Medication List        Accurate as of 05/19/17 10:10 AM. Always use your most recent med list.          acetaminophen 500 MG tablet Commonly known as:  TYLENOL Take 1,000 mg by mouth every 6 (six) hours as needed for moderate pain.   aspirin 81 MG chewable tablet Chew 1 tablet (81 mg total) by mouth daily.   atorvastatin 80 MG tablet Commonly known as:  LIPITOR Take 1 tablet (80 mg total) by mouth daily at 6 PM.   donepezil 10 MG tablet Commonly known as:  ARICEPT Take 10 mg by mouth daily.   DULoxetine 60 MG capsule Commonly known as:  CYMBALTA Take 60 mg by mouth daily.   ipratropium 0.06 % nasal spray Commonly known as:  ATROVENT Place 1 spray into the nose 3 (three) times daily as needed.   isosorbide mononitrate 30 MG 24 hr tablet Commonly known as:  IMDUR TAKE 1 TABLET DAILY   MUSCLE RUB 10-15 % Crea Apply 1 application topically as needed for muscle pain.   nitroGLYCERIN 0.4 MG SL tablet Commonly known as:  NITROSTAT Place 1 tablet (0.4 mg total) under the tongue every 5 (five) minutes as needed for chest pain.   ranitidine 150 MG capsule Commonly known as:  ZANTAC Take 150 mg by mouth 2 (two) times daily.   tamsulosin 0.4 MG Caps capsule Commonly known as:  FLOMAX Take 0.4 mg by mouth  daily.   ticagrelor 90 MG Tabs tablet Commonly known as:  BRILINTA Take 1 tablet (90 mg total) by mouth 2 (two) times daily.       Pt saw heart PA yesterday for follow up post stent. She is concerned about his claudication in his lower extremity. She will get dopplers of his lower extremities, to check for any blockages.  Pt is smoke free, still using electric cigarettes, but cut back on them as well.  SOB is the same, spoke with card about it leaving him on Brillinta.  Central PK was drawn after infusion.  PK results:

## 2017-05-21 ENCOUNTER — Telehealth (HOSPITAL_COMMUNITY): Payer: Self-pay

## 2017-05-21 NOTE — Telephone Encounter (Signed)
Called to speak with patient in regards to Cardiac Rehab - Wife of patient answered and stated patient is not home but explained to patient that we received a referral for Ralph Dawson to participate in the program. He has a Dr appt on 05/26/2017 to get sonogram of his legs. After appt I will follow up to see if patient can and is ready to participate.

## 2017-05-24 NOTE — Research (Signed)
Visit 2 PK results

## 2017-05-26 ENCOUNTER — Ambulatory Visit (HOSPITAL_COMMUNITY)
Admission: RE | Admit: 2017-05-26 | Discharge: 2017-05-26 | Disposition: A | Discharge status: Home / Self Care | Payer: BLUE CROSS/BLUE SHIELD | Source: Ambulatory Visit | Attending: Cardiovascular Disease | Admitting: Cardiovascular Disease

## 2017-05-26 ENCOUNTER — Encounter: Payer: BLUE CROSS/BLUE SHIELD | Admitting: *Deleted

## 2017-05-26 VITALS — BP 138/50 | HR 67 | Resp 16 | Wt 157.0 lb

## 2017-05-26 DIAGNOSIS — R9389 Abnormal findings on diagnostic imaging of other specified body structures: Secondary | ICD-10-CM | POA: Insufficient documentation

## 2017-05-26 DIAGNOSIS — Z87891 Personal history of nicotine dependence: Secondary | ICD-10-CM | POA: Diagnosis not present

## 2017-05-26 DIAGNOSIS — I2510 Atherosclerotic heart disease of native coronary artery without angina pectoris: Secondary | ICD-10-CM | POA: Diagnosis not present

## 2017-05-26 DIAGNOSIS — I739 Peripheral vascular disease, unspecified: Secondary | ICD-10-CM | POA: Insufficient documentation

## 2017-05-26 DIAGNOSIS — Z006 Encounter for examination for normal comparison and control in clinical research program: Secondary | ICD-10-CM

## 2017-05-26 NOTE — Progress Notes (Signed)
Visit 6   Pt doing well. SOB is better per patient. Will continue to monitor. Pt has continued to do well with no smoking. No changes to medications. He is using e-cigarettes but cutting back.   PE complete  Central Labs drawn at visit.                                     "CONSENT"   YES     NO   Continuing further Investigational Product and study visits for follow-up?       X     Continuing consent from future biomedical research      X                                       "EVENTS"    YES     NO  AE   (IF YES SEE SOURCE)      X  SAE  (IF YES SEE SOURCE)      X  ENDPOINT   (IF YES SEE SOURCE)      X  REVASCULARIZATION  (IF YES SEE SOURCE)      X  AMPUTATION   (IF YES SEE SOURCE)      X  TROPONIN'S  (IF YES SEE SOURCE)      X

## 2017-05-28 ENCOUNTER — Telehealth (HOSPITAL_COMMUNITY): Payer: Self-pay

## 2017-05-28 NOTE — Telephone Encounter (Signed)
Called to follow up with patient in regards to cardiac rehab - patient is not interested in the program as he is active on a daily basis. Closed referral.

## 2017-06-02 ENCOUNTER — Telehealth: Payer: Self-pay | Admitting: *Deleted

## 2017-06-02 NOTE — Telephone Encounter (Signed)
Pt called office today. No complaints of cp or sob. He is doing well of not smoking. Still cutting back on the e-cig. Told pt that I would touch base with him in March for his follow up.  Gave pt results for his LE test which showed no blockages.

## 2017-06-15 DIAGNOSIS — I10 Essential (primary) hypertension: Secondary | ICD-10-CM | POA: Diagnosis not present

## 2017-06-15 DIAGNOSIS — I2510 Atherosclerotic heart disease of native coronary artery without angina pectoris: Secondary | ICD-10-CM | POA: Diagnosis not present

## 2017-06-15 DIAGNOSIS — J309 Allergic rhinitis, unspecified: Secondary | ICD-10-CM | POA: Diagnosis not present

## 2017-06-15 DIAGNOSIS — K219 Gastro-esophageal reflux disease without esophagitis: Secondary | ICD-10-CM | POA: Diagnosis not present

## 2017-06-17 DIAGNOSIS — R899 Unspecified abnormal finding in specimens from other organs, systems and tissues: Secondary | ICD-10-CM | POA: Diagnosis not present

## 2017-06-18 DIAGNOSIS — H53453 Other localized visual field defect, bilateral: Secondary | ICD-10-CM | POA: Diagnosis not present

## 2017-06-23 ENCOUNTER — Encounter: Payer: Self-pay | Admitting: *Deleted

## 2017-06-23 ENCOUNTER — Telehealth: Payer: Self-pay | Admitting: *Deleted

## 2017-06-23 NOTE — Telephone Encounter (Addendum)
Visit 7 (AEGIS)  Called pt to follow up with him for his visit 7. He is doing well. Still some shortness of breath. No chest pains. No medication changes. He has started back to smoking a 1/4 pack a day, and vaping also. He has cut back on vaping. Him and his wife are trying to quit smoking still. I gave education about the benefits of smoking and bragged to him about how well he did for the 28 days he had quit. Will follow up with patient in April for Visit 8.   Current Outpatient Medications:  .  acetaminophen (TYLENOL) 500 MG tablet, Take 1,000 mg by mouth every 6 (six) hours as needed for moderate pain., Disp: , Rfl:  .  aspirin 81 MG chewable tablet, Chew 1 tablet (81 mg total) by mouth daily., Disp: , Rfl:  .  atorvastatin (LIPITOR) 80 MG tablet, Take 1 tablet (80 mg total) by mouth daily at 6 PM., Disp: 30 tablet, Rfl: 5 .  donepezil (ARICEPT) 5 MG tablet, TAKE ONE TABLET (5 MG TOTAL) BY MOUTH EVERY MORNING., Disp: , Rfl: 3 .  DULoxetine (CYMBALTA) 60 MG capsule, Take 60 mg by mouth daily., Disp: , Rfl:  .  ipratropium (ATROVENT) 0.06 % nasal spray, Place 1 spray into the nose 3 (three) times daily as needed., Disp: , Rfl: 6 .  isosorbide mononitrate (IMDUR) 30 MG 24 hr tablet, TAKE 1 TABLET DAILY, Disp: 90 tablet, Rfl: 3 .  Menthol-Methyl Salicylate (MUSCLE RUB) 10-15 % CREA, Apply 1 application topically as needed for muscle pain., Disp: , Rfl:  .  nitroGLYCERIN (NITROSTAT) 0.4 MG SL tablet, Place 1 tablet (0.4 mg total) under the tongue every 5 (five) minutes as needed for chest pain., Disp: 25 tablet, Rfl: 2 .  ranitidine (ZANTAC) 150 MG capsule, Take 150 mg by mouth 2 (two) times daily., Disp: , Rfl:  .  tamsulosin (FLOMAX) 0.4 MG CAPS, Take 0.4 mg by mouth daily., Disp: , Rfl:  .  ticagrelor (BRILINTA) 90 MG TABS tablet, Take 1 tablet (90 mg total) by mouth 2 (two) times daily., Disp: 60 tablet, Rfl: 10                                      "CONSENT"   YES     NO   Continuing  further Investigational Product and study visits for follow-up?      X   Continuing consent from future biomedical research      X                                            "EVENTS"    YES     NO  AE   (IF YES SEE SOURCE)       X  SAE  (IF YES SEE SOURCE)       X  ENDPOINT   (IF YES SEE SOURCE)       X`  REVASCULARIZATION  (IF YES SEE SOURCE)       X  AMPUTATION   (IF YES SEE SOURCE)       X  TROPONIN'S  (IF YES SEE SOURCE)       X

## 2017-06-23 NOTE — Telephone Encounter (Signed)
As the principal investigator for the AEGIS-II Trial at [facility], I have reviewed the labs and have noted the following discrepancies: No labs.  No SAE's or AE's noted. Continue with infusion protocol.  Ralph NoseKenneth C. Annalyssa Thune, MD, [facility]ACC, FACP  [place]  [facility]  Medical Director of the Advanced Lipid Disorders &  Cardiovascular Risk Reduction Clinic Diplomate of the American Board of Clinical Lipidology Attending Cardiologist  Direct Dial: [phone]  Fax: [phone]  Website:  [email or web address]

## 2017-07-07 NOTE — Addendum Note (Signed)
Encounter addended by: Claudean Kindseis, Jolene Guyett G, RN on: 07/07/2017 7:48 AM  Actions taken: Charge Capture section accepted

## 2017-07-12 NOTE — Research (Signed)
Visit 2:   Pre  Component     Latest Ref Rng & Units 04/27/2017        Pre procedure  Sodium     135 - 145 mmol/L 141  Potassium     3.5 - 5.1 mmol/L 3.9  Chloride     101 - 111 mmol/L 108  CO2     22 - 32 mmol/L 23  Glucose     65 - 99 mg/dL 86  BUN     6 - 20 mg/dL 13  Creatinine     2.130.61 - 1.24 mg/dL 0.861.25 (H)  Calcium     8.9 - 10.3 mg/dL 8.7 (L)  Total Protein     6.5 - 8.1 g/dL 6.2 (L)  Albumin     3.5 - 5.0 g/dL 3.5  AST     15 - 41 U/L 23  ALT     17 - 63 U/L 14 (L)  Alkaline Phosphatase     38 - 126 U/L 80  Total Bilirubin     0.3 - 1.2 mg/dL 1.5 (H)  GFR, Est Non African American     >60 mL/min 54 (L)  GFR, Est African American     >60 mL/min >60  Anion gap     5 - 15 10   Post Procedure:  Component     Latest Ref Rng & Units 04/28/2017         1:14 PM  Sodium     135 - 145 mmol/L 140  Potassium     3.5 - 5.1 mmol/L 4.3  Chloride     101 - 111 mmol/L 112 (H)  CO2     22 - 32 mmol/L 21 (L)  Glucose     65 - 99 mg/dL 89  BUN     6 - 20 mg/dL 18  Creatinine     5.780.61 - 1.24 mg/dL 4.691.42 (H)  Calcium     8.9 - 10.3 mg/dL 8.6 (L)  Total Protein     6.5 - 8.1 g/dL 6.5  Albumin     3.5 - 5.0 g/dL 3.5  AST     15 - 41 U/L 25  ALT     17 - 63 U/L 15 (L)  Alkaline Phosphatase     38 - 126 U/L 72  Total Bilirubin     0.3 - 1.2 mg/dL 1.5 (H)  GFR, Est Non African American     >60 mL/min 46 (L)  GFR, Est African American     >60 mL/min 53 (L)  Anion gap     5 - 15 7

## 2017-07-12 NOTE — Progress Notes (Signed)
Late entry:  PI saw labs before infusion.  Visit 3 infusion 2  Component     Latest Ref Rng & Units 05/05/2017          Sodium     135 - 145 mmol/L 139  Potassium     3.5 - 5.1 mmol/L 4.3  Chloride     101 - 111 mmol/L 107  CO2     22 - 32 mmol/L 21 (L)  Glucose     65 - 99 mg/dL 098107 (H)  BUN     6 - 20 mg/dL 14  Creatinine     1.190.61 - 1.24 mg/dL 1.471.37 (H)  Calcium     8.9 - 10.3 mg/dL 8.9  Total Protein     6.5 - 8.1 g/dL 6.4 (L)  Albumin     3.5 - 5.0 g/dL 3.7  AST     15 - 41 U/L 22  ALT     17 - 63 U/L 19  Alkaline Phosphatase     38 - 126 U/L 97  Total Bilirubin     0.3 - 1.2 mg/dL 1.1  GFR, Est Non African American     >60 mL/min 48 (L)  GFR, Est African American     >60 mL/min 56 (L)  Anion gap     5 - 15 11

## 2017-07-15 ENCOUNTER — Other Ambulatory Visit: Payer: Self-pay

## 2017-07-15 MED ORDER — TICAGRELOR 90 MG PO TABS: 90.0000 mg | ORAL_TABLET | Freq: Two times a day (BID) | ORAL | 4 refills | Status: DC

## 2017-07-19 ENCOUNTER — Encounter: Payer: Self-pay | Admitting: Cardiovascular Disease

## 2017-07-19 ENCOUNTER — Encounter: Payer: BLUE CROSS/BLUE SHIELD | Admitting: *Deleted

## 2017-07-19 ENCOUNTER — Ambulatory Visit (INDEPENDENT_AMBULATORY_CARE_PROVIDER_SITE_OTHER): Payer: BLUE CROSS/BLUE SHIELD | Admitting: Cardiovascular Disease

## 2017-07-19 VITALS — BP 114/59 | HR 85 | Wt 155.0 lb

## 2017-07-19 VITALS — BP 114/56 | HR 85 | Ht 68.0 in | Wt 155.0 lb

## 2017-07-19 DIAGNOSIS — I10 Essential (primary) hypertension: Secondary | ICD-10-CM

## 2017-07-19 DIAGNOSIS — I2510 Atherosclerotic heart disease of native coronary artery without angina pectoris: Secondary | ICD-10-CM

## 2017-07-19 DIAGNOSIS — Z720 Tobacco use: Secondary | ICD-10-CM | POA: Diagnosis not present

## 2017-07-19 DIAGNOSIS — I4510 Unspecified right bundle-branch block: Secondary | ICD-10-CM

## 2017-07-19 DIAGNOSIS — I2583 Coronary atherosclerosis due to lipid rich plaque: Secondary | ICD-10-CM

## 2017-07-19 DIAGNOSIS — E785 Hyperlipidemia, unspecified: Secondary | ICD-10-CM

## 2017-07-19 DIAGNOSIS — Z006 Encounter for examination for normal comparison and control in clinical research program: Secondary | ICD-10-CM

## 2017-07-19 MED ORDER — METOPROLOL SUCCINATE ER 25 MG PO TB24: 25.0000 mg | ORAL_TABLET | Freq: Every day | ORAL | 3 refills | Status: DC

## 2017-07-19 NOTE — Patient Instructions (Signed)
Medication Instructions:  START metoprolol succinate (Toprol XL) 25 mg daily  Follow-Up: Your physician wants you to follow-up in: 6 months with Dr. Tresa EndoKelly. You will receive a reminder letter in the mail two months in advance. If you don't receive a letter, please call our office to schedule the follow-up appointment.   Any Other Special Instructions Will Be Listed Below (If Applicable).     If you need a refill on your cardiac medications before your next appointment, please call your pharmacy.

## 2017-07-19 NOTE — Progress Notes (Signed)
Patient ID: Ralph Dawson, male   DOB: [birth year], 77 y.o.   MRN: [id number]     HPI: Ralph Dawson is a 77 y.o. male presents to the office today for 52 month cardiology evaluation  Ralph Dawson has documented PVD and underwent left carotid endarterectomy in September 2009 and in April 2011 underwent right carotid endarterectomy. He has a history of lower extremity intermittent claudication, hyperlipidemia, as well as mild valvular heart disease with mild mitral annular calcification with mild MR, mild TR, and mild aortic valve sclerosis with normal systolic and diastolic function. A nuclear perfusion study November 2011 showed normal perfusion. He has had GERD symptoms which have improved with pantoprazole. He was started on pravastatin 80 mg by Dr. Edrick Oh and last year hisLDL cholesterol  was 65. He does have documented right bundle branch block.  He tells me he underwent a complete set of blood work partially 2 months ago by Dr. Edrick Oh.  I will obtain these results for my review.    When I last saw him he denied any episodes of chest pain applications.. There is a long-standing tobacco history and continues to smoke, now only 8 cigarettes per day. He has noticed some mild shortness of breath, particularly with significant activity.  He has a history of GERD for which he takes protonix.  He has been taking Cymbalta for depression.  In November 2015 an echo Doppler study showed mild left ventricular hypertrophy with hyperdynamic LV function and ejection fraction of 65-70%.  There was grade 1 diastolic dysfunction.  Since I last saw him he remains active.  Unfortunately he continues to smoke and has been smoking for 68 years.  He was seen by Almyra Deforest in January 2018. In April 27 2017 he underwent cardiac catheterization by Dr. Irish Lack found to have a 95% proximal circumflex stenosis which required PCI and use of a Guideliner to get the stent around the proximal bend in the circumflex vessel.  Echo  Doppler study revealed an EF of 60-65%.  There were no significant valvular abnormalities.  His last seen in the office in February 2019 by Jory Sims, NP.  We, he has been without chest pain.  He remains active.  He denies any change in symptomatology.  He presents for reevaluation.  Past Medical History:  Diagnosis Date  . Arthritis    "hands, feet" (04/27/2017)  . Carotid artery occlusion   . Carotid stenosis 09/24/2011   R ICA patent w/ hx of endarterectomy, stenosis 1-39%;  L ICA patent w/ hx of endarterectomy; external carotids appear patent; see imaging tab for full report  . Chest heaviness 02/25/2009   PVCs, atrial bigeminy  . Claudication ([place]) 06/17/2011   LE doppler - bilateral ABIs normal values at rest; R CIA >50% diameter reduction L CIA 0-49% reduction; bilateral SFAs mild/mod mixed density plaque throughout suggesting 50-69% diameter reduction  . GERD (gastroesophageal reflux disease)   . Hyperlipidemia   . Hypertension 02/13/2010   echo - EF >55%; mild mitral annular calcification; mild aortic valve sclerosis  . NSTEMI (non-ST elevated myocardial infarction) ([place]) 04/26/2017   Ralph Dawson 04/27/2017  . RBBB (right bundle branch block) 02/13/2010   R/P MV - EF 67%; normal perfusion all regions; no significant wall abnormalties noted    Past Surgical History:  Procedure Laterality Date  . APPENDECTOMY    . ARTERIAL BYPASS SURGRY     pt unaware of this OR on 04/27/2017  . BACK SURGERY    . CAROTID ENDARTERECTOMY  Right 07/15/2009   Right CEA  . CAROTID ENDARTERECTOMY Left 12/13/2007   Left CEA  . CORONARY ANGIOPLASTY WITH STENT PLACEMENT  04/27/2017  . CORONARY STENT INTERVENTION N/A 04/27/2017   Procedure: CORONARY STENT INTERVENTION;  Surgeon: Jettie Booze, MD;  Location: [facility];  Service: Cardiovascular;  Laterality: N/A;  . LAPAROSCOPIC CHOLECYSTECTOMY    . LEFT HEART CATH AND CORONARY ANGIOGRAPHY N/A 04/27/2017   Procedure: LEFT HEART CATH AND  CORONARY ANGIOGRAPHY;  Surgeon: Jettie Booze, MD;  Location: [facility];  Service: Cardiovascular;  Laterality: N/A;  . LUMBAR [place]    . TONSILLECTOMY      No Known Allergies  Current Outpatient Medications  Medication Sig Dispense Refill  . acetaminophen (TYLENOL) 500 MG tablet Take 1,000 mg by mouth every 6 (six) hours as needed for moderate pain.    Marland Kitchen aspirin 81 MG chewable tablet Chew 1 tablet (81 mg total) by mouth daily.    Marland Kitchen atorvastatin (LIPITOR) 80 MG tablet Take 1 tablet (80 mg total) by mouth daily at 6 PM. 30 tablet 5  . donepezil (ARICEPT) 5 MG tablet TAKE ONE TABLET (5 MG TOTAL) BY MOUTH EVERY MORNING.  3  . DULoxetine (CYMBALTA) 60 MG capsule Take 60 mg by mouth daily.    Marland Kitchen ipratropium (ATROVENT) 0.06 % nasal spray Place 1 spray into the nose 3 (three) times daily as needed.  6  . isosorbide mononitrate (IMDUR) 30 MG 24 hr tablet TAKE 1 TABLET DAILY 90 tablet 3  . Menthol-Methyl Salicylate (MUSCLE RUB) 10-15 % CREA Apply 1 application topically as needed for muscle pain.    . nitroGLYCERIN (NITROSTAT) 0.4 MG SL tablet Place 1 tablet (0.4 mg total) under the tongue every 5 (five) minutes as needed for chest pain. 25 tablet 2  . ranitidine (ZANTAC) 150 MG capsule Take 150 mg by mouth 2 (two) times daily.    . tamsulosin (FLOMAX) 0.4 MG CAPS Take 0.4 mg by mouth daily.    . ticagrelor (BRILINTA) 90 MG TABS tablet Take 1 tablet (90 mg total) by mouth 2 (two) times daily. 60 tablet 4  . metoprolol succinate (TOPROL XL) 25 MG 24 hr tablet Take 1 tablet (25 mg total) by mouth daily. 90 tablet 3   No current facility-administered medications for this visit.     Social History   Socioeconomic History  . Marital status: Married    Spouse name: Not on file  . Number of children: Not on file  . Years of education: Not on file  . Highest education level: Not on file  Occupational History  . Not on file  Social Needs  . Financial resource strain: Not on  file  . Food insecurity:    Worry: Not on file    Inability: Not on file  . Transportation needs:    Medical: Not on file    Non-medical: Not on file  Tobacco Use  . Smoking status: Current Some Day Smoker    Packs/day: 0.25    Years: 65.00    Pack years: 16.25    Types: Cigarettes    Last attempt to quit: 06/14/2017    Years since quitting: 0.1  . Smokeless tobacco: Never Used  . Tobacco comment: smoking a 10 cigarettes a week   Substance and Sexual Activity  . Alcohol use: No  . Drug use: No  . Sexual activity: Yes  Lifestyle  . Physical activity:    Days per week: Not  on file    Minutes per session: Not on file  . Stress: Not on file  Relationships  . Social connections:    Talks on phone: Not on file    Gets together: Not on file    Attends religious service: Not on file    Active member of club or organization: Not on file    Attends meetings of clubs or organizations: Not on file    Relationship status: Not on file  . Intimate partner violence:    Fear of current or ex partner: Not on file    Emotionally abused: Not on file    Physically abused: Not on file    Forced sexual activity: Not on file  Other Topics Concern  . Not on file  Social History Narrative  . Not on file    Family History  Problem Relation Age of Onset  . Cancer Mother        stomach  . Cancer Sister   . Diabetes Brother   . Cancer Daughter    ROS General: Negative; No fevers, chills, or night sweats;  HEENT: Negative; No changes in vision or hearing, sinus congestion, difficulty swallowing Pulmonary: Negative; No cough, wheezing, shortness of breath, hemoptysis Cardiovascular: Negative; No chest pain, presyncope, syncope, palpitations GI: Positive for GERD; No nausea, vomiting, diarrhea, or abdominal pain GU: Negative; No dysuria, hematuria, or difficulty voiding Musculoskeletal: Positive heel spur discomfort; no myalgias, or weakness Hematologic/Oncology: Negative; no easy bruising,  bleeding Endocrine: Negative; no heat/cold intolerance; no diabetes Neuro: Negative; no changes in balance, headaches Skin: Negative; No rashes or skin lesions Psychiatric: Positive for depression Sleep: Negative; No snoring, daytime sleepiness, hypersomnolence, bruxism, restless legs, hypnogognic hallucinations, no cataplexy Other comprehensive 14 point system review is negative.   PE BP (!) 114/56   Pulse 85   Ht '5\' 8"'  (1.727 m)   Wt 155 lb (70.3 kg)   BMI 23.57 kg/m     Wt Readings from Last 3 Encounters:  07/19/17 155 lb (70.3 kg)  07/19/17 155 lb (70.3 kg)  05/26/17 157 lb (71.2 kg)   General: Alert, oriented, no distress.  Skin: normal turgor, no rashes, warm and dry HEENT: Normocephalic, atraumatic. Pupils equal round and reactive to light; sclera anicteric; extraocular muscles intact;  Nose without nasal septal hypertrophy Mouth/Parynx benign; Mallinpatti scale 3 Neck: No JVD, no carotid bruits; normal carotid upstroke Lungs: clear to ausculatation and percussion; no wheezing or rales Chest wall: without tenderness to palpitation Heart: PMI not displaced, RRR, s1 s2 normal, 1/6 systolic murmur, no diastolic murmur, no rubs, gallops, thrills, or heaves Abdomen: soft, nontender; no hepatosplenomehaly, BS+; abdominal aorta nontender and not dilated by palpation. Back: no CVA tenderness Pulses 2+ Musculoskeletal: full range of motion, normal strength, no joint deformities Extremities: no clubbing cyanosis or edema, Homan's sign negative  Neurologic: grossly nonfocal; Cranial nerves grossly wnl Psychologic: Normal mood and affect   ECG (independently read by me): Normal sinus rhythm at 85 bpm.  Right bundle branch block with repolarization changes.  April 2016 ECG (independently read by me): Sinus rhythm at 67 bpm with occasional PVCs with transient trigeminal rhythm; right bundle branch block.  October 2015 ECG (independently read by me):Normal sinus rhythm at 70  beats per minute.  Right bundle branch block with repolarization changes.  Normal intervals.  Prior ECG: Sinus rhythm with right bundle branch block at 67 beats per minute.  LABS:   BMP Latest Ref Rng & Units 05/05/2017 04/28/2017 04/28/2017  Glucose  65 - 99 mg/dL 107(H) 89 118(H)  BUN 6 - 20 mg/dL '14 18 19  ' Creatinine 0.61 - 1.24 mg/dL 1.37(H) 1.42(H) 1.51(H)  Sodium 135 - 145 mmol/L 139 140 138  Potassium 3.5 - 5.1 mmol/L 4.3 4.3 4.1  Chloride 101 - 111 mmol/L 107 112(H) 110  CO2 22 - 32 mmol/L 21(L) 21(L) 19(L)  Calcium 8.9 - 10.3 mg/dL 8.9 8.6(L) 8.9   Hepatic Function Panel   Hepatic Function Latest Ref Rng & Units 05/05/2017 04/28/2017 04/27/2017  Total Protein 6.5 - 8.1 g/dL 6.4(L) 6.5 6.2(L)  Albumin 3.5 - 5.0 g/dL 3.7 3.5 3.5  AST 15 - 41 U/L '22 25 23  ' ALT 17 - 63 U/L 19 15(L) 14(L)  Alk Phosphatase 38 - 126 U/L 97 72 80  Total Bilirubin 0.3 - 1.2 mg/dL 1.1 1.5(H) 1.5(H)  Bilirubin, Direct 0.1 - 0.5 mg/dL - 0.2 -    CBC  CBC Latest Ref Rng & Units 04/28/2017 04/28/2017 04/27/2017  WBC 4.0 - 10.5 K/uL 10.1 11.6(H) 7.9  Hemoglobin 13.0 - 17.0 g/dL 13.7 14.8 15.8  Hematocrit 39.0 - 52.0 % 41.4 43.8 45.6  Platelets 150 - 400 K/uL 215 251 205   Lab Results  Component Value Date   TSH 2.253 04/26/2017   BNP No results found for: PROBNP  Lipid Panel     Component Value Date/Time   CHOL 121 04/27/2017 0237   Lipid Panel     Component Value Date/Time   CHOL 121 04/27/2017 0237   TRIG 74 04/27/2017 0237   HDL 40 (L) 04/27/2017 0237   CHOLHDL 3.0 04/27/2017 0237   VLDL 15 04/27/2017 0237   LDLCALC 66 04/27/2017 0237    RADIOLOGY: No results found.  IMPRESSION:  1. Coronary artery disease due to lipid rich plaque   2. Essential hypertension   3. RBBB   4. Tobacco abuse   5. Hyperlipidemia with target LDL less than 70     ASSESSMENT AND PLAN: Ralph Dawson is a 77 year old gentleman who is status post bilateral carotid endarterectomies in 2009 and in  2011. A nuclear perfusion study in November 2011 showed normal perfusion.  He has a history of hyperlipidemia, and these have significantly improved with high-dose pravastatin therapy.  He has been on chronic isosorbide mononitrate.   An echo Doppler study in November 2015 showed normal systolic function with an EF of 65-70% and grade 1 diastolic dysfunction.  He recently was found to have a high-grade 95% stenosis in the left circumflex vessel in the proximal bend in the vessel.  This was successfully stented in January 2019.  He denies any recurrent anginal type symptomatology.  His blood pressure today is stable.  Has been maintained on isosorbide 30 mg for his concomitant CAD occludes 40 and 50% proximal and mid LAD stenoses in addition to his proximal 25% circumflex stenosis.  I have recommended the addition of Toprol-XL 25 mg for beta-blocker therapy.  He continues to be on aspirin and Plavix for dual antiplatelet therapy.  He has COPD and is on Atrovent.  He is also on Aricept and Cymbalta.  I discussed theimportance of complete smoking cessation.  I will see him in 6 months for reevaluation. Troy Sine, MD, [facility]  07/25/2017 7:35 PM

## 2017-07-19 NOTE — Progress Notes (Addendum)
Visit 8  Pt just saw Dr Tresa Endo in office. Pt has picked up back up smoking a 1/2 pack a week. No chest pain. Pt states has some shortness of breath after his Brillianta with no change but only last 10 minutes and then he is good. Metoprolol was added to his med list today.                                     "CONSENT"   YES     NO   Continuing further Investigational Product and study visits for follow-up? [x]  []   Continuing consent from future biomedical research [x]  []                                      "EVENTS"    YES     NO  AE   (IF YES SEE SOURCE) []  [x]   SAE  (IF YES SEE SOURCE) []  [x]   ENDPOINT   (IF YES SEE SOURCE) []  [x]   REVASCULARIZATION  (IF YES SEE SOURCE) []  [x]   AMPUTATION   (IF YES SEE SOURCE) []  [x]   TROPONIN'S  (IF YES SEE SOURCE) []  [x]       Health Questionnaire  English version for the [place]    By placing a checkmark in one box in each group below, please indicate which statements best describe your own health state today.     Mobility   I have no problems in walking about X  I have some problems in walking about ?  I am confined to bed ?     Self-Care   I have no problems with self-care X  I have some problems washing or dressing myself ?  I am unable to wash or dress myself ?     Usual Activities (e.g. work, study, housework, family or leisure activities)   I have no problems with performing my usual activities X  I have some problems with performing my usual activities ?  I am unable to perform my usual activities ?     Pain / Discomfort   I have no pain or discomfort X  I have moderate pain or discomfort ?  I have extreme pain or discomfort ?     Anxiety / Depression   I am not anxious or depressed X  I am moderately anxious or depressed ?  I am extremely anxious or depressed ?   To help people say how good or bad a health state is, we have drawn a scale (rather like a thermometer) on which the best state you can imagine is marked 100 and the  worst state you can imagine is marked 0.    We would like you to indicate on this scale how good or bad your own health is today, in your opinion. Please do this by drawing a line from the box below to whichever point on the scale indicates how good or bad your health state is today.   100    Current Outpatient Medications:  .  acetaminophen (TYLENOL) 500 MG tablet, Take 1,000 mg by mouth every 6 (six) hours as needed for moderate pain., Disp: , Rfl:  .  aspirin 81 MG chewable tablet, Chew 1 tablet (81 mg total) by mouth daily., Disp: , Rfl:  .  atorvastatin (LIPITOR) 80 MG tablet, Take 1  tablet (80 mg total) by mouth daily at 6 PM., Disp: 30 tablet, Rfl: 5 .  donepezil (ARICEPT) 5 MG tablet, TAKE ONE TABLET (5 MG TOTAL) BY MOUTH EVERY MORNING., Disp: , Rfl: 3 .  DULoxetine (CYMBALTA) 60 MG capsule, Take 60 mg by mouth daily., Disp: , Rfl:  .  ipratropium (ATROVENT) 0.06 % nasal spray, Place 1 spray into the nose 3 (three) times daily as needed., Disp: , Rfl: 6 .  isosorbide mononitrate (IMDUR) 30 MG 24 hr tablet, TAKE 1 TABLET DAILY, Disp: 90 tablet, Rfl: 3 .  Menthol-Methyl Salicylate (MUSCLE RUB) 10-15 % CREA, Apply 1 application topically as needed for muscle pain., Disp: , Rfl:  .  metoprolol succinate (TOPROL XL) 25 MG 24 hr tablet, Take 1 tablet (25 mg total) by mouth daily., Disp: 90 tablet, Rfl: 3 .  nitroGLYCERIN (NITROSTAT) 0.4 MG SL tablet, Place 1 tablet (0.4 mg total) under the tongue every 5 (five) minutes as needed for chest pain., Disp: 25 tablet, Rfl: 2 .  ranitidine (ZANTAC) 150 MG capsule, Take 150 mg by mouth 2 (two) times daily., Disp: , Rfl:  .  tamsulosin (FLOMAX) 0.4 MG CAPS, Take 0.4 mg by mouth daily., Disp: , Rfl:  .  ticagrelor (BRILINTA) 90 MG TABS tablet, Take 1 tablet (90 mg total) by mouth 2 (two) times daily., Disp: 60 tablet, Rfl: 4

## 2017-07-20 NOTE — Progress Notes (Signed)
Patient seen and infusions completed.  For visit 6.  Patient stable.  Exam benign.  Lungs clear.   Cor regular. Abdomen soft and benign.    Ralph Dawson D. Riley KillStuckey, MD, [facility]

## 2017-07-25 ENCOUNTER — Encounter: Payer: Self-pay | Admitting: Cardiovascular Disease

## 2017-08-02 NOTE — Progress Notes (Signed)
Lifestyle Adherence Assessment:    YES NO  Abstinence from smoking/remaining tobacco free  X  Cardiac Diet X   Routine physical activity and/or cardiac rehabilitation X

## 2017-08-17 ENCOUNTER — Telehealth: Payer: Self-pay | Admitting: Cardiovascular Disease

## 2017-08-17 ENCOUNTER — Other Ambulatory Visit: Payer: Self-pay | Admitting: Cardiology

## 2017-08-17 NOTE — Telephone Encounter (Signed)
Try reducing the metoprolol to 12.5 mg daily and after 5 days document blood pressure and heart rate

## 2017-08-17 NOTE — Telephone Encounter (Signed)
Pt reports that every since he has started on the metoprolol he has been feeling dizzy. Pt states the lowest his BP reading was 114/56. This AM reading was 127/60 with HR 53. He denies any other symptoms. Routing to MD for further recommendation.

## 2017-08-17 NOTE — Telephone Encounter (Signed)
New Message   Pt c/o medication issue:  1. Name of Medication: metoprolol succinate (TOPROL XL) 25 MG 24 hr tablet  2. How are you currently taking this medication (dosage and times per day)? Take 1 tablet (25 mg total) by mouth daily  3. Are you having a reaction (difficulty breathing--STAT)? yes  4. What is your medication issue? States he is having dizziness and doesn't know why he was prescribed this medication

## 2017-08-18 ENCOUNTER — Other Ambulatory Visit: Payer: Self-pay

## 2017-08-18 MED ORDER — METOPROLOL SUCCINATE ER 25 MG PO TB24: 12.5000 mg | ORAL_TABLET | Freq: Every day | ORAL | 3 refills | Status: DC

## 2017-08-18 NOTE — Telephone Encounter (Signed)
Spoke with pt and informed of Dr. Landry Dyke recommendation to reduce Metoprolol to 12.5 mg. Orders reflected. Pt verbalized understanding.

## 2017-08-23 ENCOUNTER — Telehealth: Payer: Self-pay | Admitting: *Deleted

## 2017-08-23 NOTE — Telephone Encounter (Signed)
Pt called me this am, pt feeling dizzy, feeling weak. His BP readings have been BP 106/50 HR 56; BP 75/40 HR 86; BP 124/60 HR 78; BP 84/45 HR 85. Today's reading was BP 84/45 HR 85. He said that he is not sure the metop is helping maybe making worse. He also asked about switching to Plavix instead of Brillinta.   Thanks [place] :)

## 2017-08-23 NOTE — Telephone Encounter (Signed)
Okay to switch Brilinta to Plavix.  Recommend a P2Y12 in approximately week to make certain he is Plavix responsive.  If blood pressure continues to be low, less than 100 on reduced Toprol at 12.5 mg may need to discontinue.

## 2017-08-24 ENCOUNTER — Telehealth: Payer: Self-pay | Admitting: *Deleted

## 2017-08-24 ENCOUNTER — Telehealth: Payer: Self-pay | Admitting: Cardiovascular Disease

## 2017-08-24 NOTE — Telephone Encounter (Signed)
LMTCB

## 2017-08-24 NOTE — Telephone Encounter (Signed)
Message has been routed to MD

## 2017-08-24 NOTE — Telephone Encounter (Signed)
New Message    Patient is calling to see about the outcome of the conversation between Dr. Tresa Endo and RN Mercer Pod.. Since he has not heard from either office.

## 2017-08-24 NOTE — Telephone Encounter (Signed)
Cala Bradford RN notified patient of MD suggestions.

## 2017-08-24 NOTE — Telephone Encounter (Signed)
Called patient this morning to relay message from Dr. Tresa Endo. Patient states that he does not think dizziness and low blood pressure is coming from Brilinta and does not want to switch to Plavix. He thinks its related to Toprol. He has not taken Toprol for 2 days and when question what his blood pressure was this morning he had not taken it. While on the phone he took blood pressure 90/50.He takes his Toprol in the morning and when reviewing his historical readings: 08/20/17 morning 124/60 afternoon 128/63 and 128/53. I will attach note to Dr. Tresa Endo and Julaine Fusi for advice.

## 2017-08-25 ENCOUNTER — Telehealth: Payer: Self-pay | Admitting: *Deleted

## 2017-08-25 NOTE — Telephone Encounter (Signed)
Patient is aware of recommendations and verbalized understanding.

## 2017-08-25 NOTE — Telephone Encounter (Signed)
Patient return call to report  That after reducing  Metoprolol succinate 12.5 mg  Daily  from 08/19/17 telephone  . Patient states he is still having dizziness.  Patient states he has held medication for the last 3 days and dizziness went away.  Blood pressure arrange 120-140's/65.  patient will try taking medication at bedtime  instead of taking it in the morning.  Patient will call back if symptoms continue

## 2017-08-25 NOTE — Telephone Encounter (Signed)
OK  to try holding Toprol to see if symptoms resolve

## 2017-09-20 DIAGNOSIS — K219 Gastro-esophageal reflux disease without esophagitis: Secondary | ICD-10-CM | POA: Diagnosis not present

## 2017-09-20 DIAGNOSIS — Z23 Encounter for immunization: Secondary | ICD-10-CM | POA: Diagnosis not present

## 2017-09-20 DIAGNOSIS — I2510 Atherosclerotic heart disease of native coronary artery without angina pectoris: Secondary | ICD-10-CM | POA: Diagnosis not present

## 2017-09-20 DIAGNOSIS — I10 Essential (primary) hypertension: Secondary | ICD-10-CM | POA: Diagnosis not present

## 2017-10-22 DIAGNOSIS — M65331 Trigger finger, right middle finger: Secondary | ICD-10-CM | POA: Diagnosis not present

## 2017-10-22 DIAGNOSIS — M79645 Pain in left finger(s): Secondary | ICD-10-CM | POA: Diagnosis not present

## 2017-10-22 DIAGNOSIS — M1812 Unilateral primary osteoarthritis of first carpometacarpal joint, left hand: Secondary | ICD-10-CM | POA: Diagnosis not present

## 2017-10-22 DIAGNOSIS — M79644 Pain in right finger(s): Secondary | ICD-10-CM | POA: Diagnosis not present

## 2017-10-22 DIAGNOSIS — M65332 Trigger finger, left middle finger: Secondary | ICD-10-CM | POA: Diagnosis not present

## 2017-10-25 ENCOUNTER — Encounter: Payer: BLUE CROSS/BLUE SHIELD | Admitting: *Deleted

## 2017-10-25 VITALS — BP 130/67 | HR 61 | Wt 155.0 lb

## 2017-10-25 DIAGNOSIS — Z006 Encounter for examination for normal comparison and control in clinical research program: Secondary | ICD-10-CM

## 2017-10-25 NOTE — Progress Notes (Signed)
Visit 9   Pt doing well. No chest pain or sob. Pt is being active.  Lifestyle Adherence Assessment:    YES NO  Abstinence from smoking/remaining tobacco free  X  Cardiac Diet X   Routine physical activity and/or cardiac rehabilitation X     [id number]  MOBILITY:    I HAVE NO PROBLEMS WALKING [x]   I HAVE SLIGHT PROBLEMS WALKING []   I HAVE MODERATE PROBLEMS WALKING []   I HAVE SEVERE PROBLEMS WALKING []   I AM UNABLE TO WALK  []     SELF-CARE:   I HAVE NO PROBLEMS WASING OR DRESSING MYSELF  [x]   I HAVE SLIGHT PROBLEMS WASHING OR DRESSING MYSELF  []   I HAVE MODERATE PROBLEMS WASHING OR DRESSING MYSELF []   I HAVE SEVERE PROBLEMS WASHING OR DRESSING MYSELF  []   I HAVE SEVERE PROBLEMS WASHING OR DRESSING MYSELF  []   I AM UNABLE TO WASH OR DRESS MYSELF []     USUAL ACTIVITIES: (E.G. WORK/STUDY/HOUSEWORK/FAMILY OR LEISURE ACTIVITIES.    I HAVE NO PROBLEMS DOING MY USUAL ACTIVITIES [x]   I HAVE SLIGHT PROBLEMS DOING MY USUAL ACTIVITIES []   I HAVE MODERATE PROBLEMS DOING MY USUAL ACTIVIITIES []   I HAVE SEVERE PROBLEMS DOING MY USUAL ACTIVITIES []   I AM UNABLE TO DO MY USUAL ACTIVITIES []     PAIN /DISCOMFORT   I HAVE NO PAIN OR DISCOMFORT [x]   I HAVE SLIGHT PAIN OR DISCOMFORT []   I HAVE MODERATE PAIN OR DISCOMFORT []   I HAVE SEVERE PAIN OR DISCOMFORT []   I HAVE EXTREME PAIN OR DISCOMFORT []     ANXIETY/DEPRESSION   I AM NOT ANXIOUS OR DEPRESSED [x]   I AM SLIGHTLY ANXIOUS OR DEPRESSED []   I AM MODERATELY ANXIOUS OR DREPRESSED []   I AM SEVERELY ANXIOUS OR DEPRESSED []   I AM EXTREMELY ANXIOUS OR DEPRESSED []     SCALE OF 0-100 HOW WOULD YOU RATE TODAY?  0 IS THE WORSE AND 100 IS THE BEST HEALTH YOU CAN IMAGINE: 100    Current Outpatient Medications:  .  acetaminophen (TYLENOL) 500 MG tablet, Take 1,000 mg by mouth every 6 (six) hours as needed for moderate pain., Disp: , Rfl:  .  aspirin 81 MG chewable tablet, Chew 1 tablet (81 mg total) by mouth daily., Disp: , Rfl:  .   atorvastatin (LIPITOR) 80 MG tablet, TAKE 1 TABLET BY MOUTH EVERY DAY AT 6PM, Disp: 30 tablet, Rfl: 4 .  donepezil (ARICEPT) 5 MG tablet, TAKE ONE TABLET (5 MG TOTAL) BY MOUTH EVERY MORNING., Disp: , Rfl: 3 .  DULoxetine (CYMBALTA) 60 MG capsule, Take 60 mg by mouth daily., Disp: , Rfl:  .  ipratropium (ATROVENT) 0.06 % nasal spray, Place 1 spray into the nose 3 (three) times daily as needed., Disp: , Rfl: 6 .  isosorbide mononitrate (IMDUR) 30 MG 24 hr tablet, TAKE 1 TABLET DAILY, Disp: 90 tablet, Rfl: 3 .  meloxicam (MOBIC) 7.5 MG tablet, Take by mouth., Disp: , Rfl:  .  Menthol-Methyl Salicylate (MUSCLE RUB) 10-15 % CREA, Apply 1 application topically as needed for muscle pain., Disp: , Rfl:  .  metoprolol succinate (TOPROL XL) 25 MG 24 hr tablet, Take 0.5 tablets (12.5 mg total) by mouth daily., Disp: 90 tablet, Rfl: 3 .  nitroGLYCERIN (NITROSTAT) 0.4 MG SL tablet, Place 1 tablet (0.4 mg total) under the tongue every 5 (five) minutes as needed for chest pain., Disp: 25 tablet, Rfl: 2 .  ranitidine (ZANTAC) 150 MG capsule, Take 150 mg by mouth 2 (two)  times daily., Disp: , Rfl:  .  tamsulosin (FLOMAX) 0.4 MG CAPS, Take 0.4 mg by mouth daily., Disp: , Rfl:  .  ticagrelor (BRILINTA) 90 MG TABS tablet, Take 1 tablet (90 mg total) by mouth 2 (two) times daily., Disp: 60 tablet, Rfl: 4

## 2017-12-02 NOTE — Progress Notes (Signed)
                                  "  CONSENT"   YES     NO   Continuing further Investigational Product and study visits for follow-up? [x]  []   Continuing consent from future biomedical research [x]  []                                      "EVENTS"    YES     NO  AE   (IF YES SEE SOURCE) []  [x]   SAE  (IF YES SEE SOURCE) []  [x]   ENDPOINT   (IF YES SEE SOURCE) []  [x]   REVASCULARIZATION  (IF YES SEE SOURCE) []  [x]   AMPUTATION   (IF YES SEE SOURCE) []  [x]   TROPONIN'S  (IF YES SEE SOURCE) []  [x]

## 2017-12-07 DIAGNOSIS — M65332 Trigger finger, left middle finger: Secondary | ICD-10-CM | POA: Diagnosis not present

## 2017-12-07 DIAGNOSIS — I10 Essential (primary) hypertension: Secondary | ICD-10-CM | POA: Diagnosis not present

## 2017-12-08 ENCOUNTER — Telehealth: Payer: Self-pay | Admitting: *Deleted

## 2017-12-08 ENCOUNTER — Telehealth: Payer: Self-pay | Admitting: Cardiovascular Disease

## 2017-12-08 NOTE — Telephone Encounter (Signed)
    [facility] Pre-operative Risk Assessment    Request for surgical clearance:  1. What type of surgery is being performed? Left middle finger trigger release   2. When is this surgery scheduled? 12/16/17   3. What type of clearance is required (medical clearance vs. Pharmacy clearance to hold med vs. Both)? both  4. Are there any medications that need to be held prior to surgery and how long? Brilinta, ASA   5. Practice name and name of physician performing surgery? [facility] Dr. Smitty Cords   6. What is your office phone number [phone]    7.   What is your office fax number [phone] attention: Amy Stewart  8.   Anesthesia type (None, local, MAC, general) ?    Kenley Rettinger A Shakea Isip 12/08/2017, 4:55 PM  _________________________________________________________________   (provider comments below)

## 2017-12-08 NOTE — Telephone Encounter (Signed)
Did not  Need this encounter °

## 2017-12-09 ENCOUNTER — Other Ambulatory Visit: Payer: Self-pay | Admitting: Cardiovascular Disease

## 2017-12-09 NOTE — Telephone Encounter (Signed)
  Last seen by Dr. Tresa EndoKelly 07/19/2017 and doing well.  Left a message for the patient to call us.  Theodore DemarkRhonda Jarius Dieudonne, PA-C 12/09/2017 3:55 PM Beeper [phone]

## 2017-12-09 NOTE — Telephone Encounter (Addendum)
    Chart reviewed.  Spoke with Dr. Tresa EndoKelly regarding holding aspirin and Brilinta.  He had a stent to his circumflex in January 2019.  Because of the patient's anatomy, it was a difficult procedure.  Therefore, unless it is an urgent/emergent procedure, continue both Brilinta and aspirin until January 2020.  [name] is due for an appointment with Dr. Tresa EndoKelly in October.  I will route this to the preop call back pool to get the information to the surgeon regarding continuing aspirin and Plavix until January as well as getting the patient an appointment.  Theodore Demarkhonda Barrett, PA-C 12/09/2017 4:07 PM Beeper [phone]

## 2017-12-09 NOTE — Telephone Encounter (Signed)
Rx sent to pharmacy   

## 2017-12-10 NOTE — Telephone Encounter (Signed)
I s/w pt's wife in regards to surgery clearance that pt is cannot hold Brilinta or ASA until 04/2018 per Theodore Demarkhonda Barrett, PA and Dr. Tresa EndoKelly. Pt was also due to see dr. Tresa EndoKelly 01/2018. appt has been made to see Joni ReiningKathryn Lawrence 01/17/18 @ 10:30 same day Dr. Tresa EndoKelly is in the office.  Cardiologist, Dr. Tresa EndoKelly.   [facility]. Advised pt will not be able to have surgery as he will not be able to hold his Brilinta and ASA until 04/2018 due to cardiac stents placed 04/2017 pe Dr. Tresa EndoKelly, pt's Cardiologist. I will fax this over to Dr. Silvestre MomentBrumfield's office fax # [phone].

## 2017-12-21 DIAGNOSIS — I10 Essential (primary) hypertension: Secondary | ICD-10-CM | POA: Diagnosis not present

## 2017-12-21 DIAGNOSIS — I2510 Atherosclerotic heart disease of native coronary artery without angina pectoris: Secondary | ICD-10-CM | POA: Diagnosis not present

## 2017-12-21 DIAGNOSIS — K219 Gastro-esophageal reflux disease without esophagitis: Secondary | ICD-10-CM | POA: Diagnosis not present

## 2017-12-21 DIAGNOSIS — F3342 Major depressive disorder, recurrent, in full remission: Secondary | ICD-10-CM | POA: Diagnosis not present

## 2017-12-21 DIAGNOSIS — I779 Disorder of arteries and arterioles, unspecified: Secondary | ICD-10-CM | POA: Diagnosis not present

## 2017-12-21 DIAGNOSIS — E785 Hyperlipidemia, unspecified: Secondary | ICD-10-CM | POA: Diagnosis not present

## 2017-12-21 DIAGNOSIS — R799 Abnormal finding of blood chemistry, unspecified: Secondary | ICD-10-CM | POA: Diagnosis not present

## 2018-01-03 DIAGNOSIS — I10 Essential (primary) hypertension: Secondary | ICD-10-CM | POA: Diagnosis not present

## 2018-01-03 DIAGNOSIS — J209 Acute bronchitis, unspecified: Secondary | ICD-10-CM | POA: Diagnosis not present

## 2018-01-16 NOTE — Progress Notes (Signed)
Cardiology Office Note   Date:  01/16/2018   ID:  Ralph Dawson, DOB 07/16/1940, MRN [id number]  PCP:  Joette Catching, MD  Cardiologist: Dr. Tresa Endo No chief complaint on file.    History of Present Illness: Ralph Dawson is a 77 y.o. male who presents for ongoing assessment and management of carotid artery disease, peripheral arterial disease, hyperlipidemia, valvular heart disease with mild mitral annular calcifications, mild MR and TR along with mild aortic valve sclerosis.  Most recent cardiac catheterization in January 2019 revealed a 95% proximal circumflex stenosis which required PCI and use of guide liner to get the stent around the proximal band in the circumflex vessel.  Echocardiogram revealed normal LV systolic function of 60% to 65%.  He unfortunately continues to smoke.  He was last seen by Dr. Tresa Endo on 07/19/2017, was continued on dual antiplatelet therapy, Toprol-XL 25 mg was ordered.  He was to continue on other medication regimen prior to appointment.  He is here for six-month follow-up.  He is without complaints from cardiac standpoint  He is being treated for pneumonia by his PCP.  He has had labs completed last week. PCP is managing his cholesterol.  The patient unfortunately continues to smoke. He occasionally has some mild shortness of breath when he takes Brilinta. This is getting better.   Past Medical History:  Diagnosis Date  . Arthritis    "hands, feet" (04/27/2017)  . Carotid artery occlusion   . Carotid stenosis 09/24/2011   R ICA patent w/ hx of endarterectomy, stenosis 1-39%;  L ICA patent w/ hx of endarterectomy; external carotids appear patent; see imaging tab for full report  . Chest heaviness 02/25/2009   PVCs, atrial bigeminy  . Claudication (HCC) 06/17/2011   LE doppler - bilateral ABIs normal values at rest; R CIA >50% diameter reduction L CIA 0-49% reduction; bilateral SFAs mild/mod mixed density plaque throughout suggesting 50-69% diameter reduction  .  GERD (gastroesophageal reflux disease)   . Hyperlipidemia   . Hypertension 02/13/2010   echo - EF >55%; mild mitral annular calcification; mild aortic valve sclerosis  . NSTEMI (non-ST elevated myocardial infarction) (HCC) 04/26/2017   [name] 04/27/2017  . RBBB (right bundle branch block) 02/13/2010   R/P MV - EF 67%; normal perfusion all regions; no significant wall abnormalties noted    Past Surgical History:  Procedure Laterality Date  . APPENDECTOMY    . ARTERIAL BYPASS SURGRY     pt unaware of this OR on 04/27/2017  . BACK SURGERY    . CAROTID ENDARTERECTOMY Right 07/15/2009   Right CEA  . CAROTID ENDARTERECTOMY Left 12/13/2007   Left CEA  . CORONARY ANGIOPLASTY WITH STENT PLACEMENT  04/27/2017  . CORONARY STENT INTERVENTION N/A 04/27/2017   Procedure: CORONARY STENT INTERVENTION;  Surgeon: Corky Crafts, MD;  Location: [facility] INVASIVE CV LAB;  Service: Cardiovascular;  Laterality: N/A;  . LAPAROSCOPIC CHOLECYSTECTOMY    . LEFT HEART CATH AND CORONARY ANGIOGRAPHY N/A 04/27/2017   Procedure: LEFT HEART CATH AND CORONARY ANGIOGRAPHY;  Surgeon: Corky Crafts, MD;  Location[facility] INVASIVE CV LAB;  Service: Cardiovascular;  Laterality: N/A;  . LUMBAR DISC SURGERY    . TONSILLECTOMY       Current Outpatient Medications  Medication Sig Dispense Refill  . acetaminophen (TYLENOL) 500 MG tablet Take 1,000 mg by mouth every 6 (six) hours as needed for moderate pain.    Marland Kitchen aspirin 81 MG chewable tablet Chew 1 tablet (81 mg total) by mouth daily.    Marland Kitchen  atorvastatin (LIPITOR) 80 MG tablet TAKE 1 TABLET BY MOUTH EVERY DAY AT 6PM 90 tablet 0  . donepezil (ARICEPT) 5 MG tablet TAKE ONE TABLET (5 MG TOTAL) BY MOUTH EVERY MORNING.  3  . DULoxetine (CYMBALTA) 60 MG capsule Take 60 mg by mouth daily.    Marland Kitchen ipratropium (ATROVENT) 0.06 % nasal spray Place 1 spray into the nose 3 (three) times daily as needed.  6  . isosorbide mononitrate (IMDUR) 30 MG 24 hr tablet TAKE 1 TABLET DAILY 90 tablet 3    . meloxicam (MOBIC) 7.5 MG tablet Take by mouth.    . Menthol-Methyl Salicylate (MUSCLE RUB) 10-15 % CREA Apply 1 application topically as needed for muscle pain.    . metoprolol succinate (TOPROL XL) 25 MG 24 hr tablet Take 0.5 tablets (12.5 mg total) by mouth daily. 90 tablet 3  . nitroGLYCERIN (NITROSTAT) 0.4 MG SL tablet Place 1 tablet (0.4 mg total) under the tongue every 5 (five) minutes as needed for chest pain. 25 tablet 2  . ranitidine (ZANTAC) 150 MG capsule Take 150 mg by mouth 2 (two) times daily.    . tamsulosin (FLOMAX) 0.4 MG CAPS Take 0.4 mg by mouth daily.    . ticagrelor (BRILINTA) 90 MG TABS tablet Take 1 tablet (90 mg total) by mouth 2 (two) times daily. 60 tablet 4   No current facility-administered medications for this visit.     Allergies:   Patient has no known allergies.    Social History:  The patient  reports that he has been smoking cigarettes. He has a 16.25 pack-year smoking history. He has never used smokeless tobacco. He reports that he does not drink alcohol or use drugs.   Family History:  The patient's family history includes Cancer in his daughter, mother, and sister; Diabetes in his brother.    ROS: All other systems are reviewed and negative. Unless otherwise mentioned in H&P    PHYSICAL EXAM: VS:  There were no vitals taken for this visit. , BMI There is no height or weight on file to calculate BMI. GEN: Well nourished, well developed, in no acute distress HEENT: normal Neck: no JVD, carotid bruits, or masses Cardiac: RRR; no murmurs, rubs, or gallops,no edema  Respiratory:  Clear to auscultation bilaterally, no wheezes, no coughing, normal work of breathing GI: soft, nontender, nondistended, + BS MS: no deformity or atrophy Skin: warm and dry, no rash Neuro:  Strength and sensation are intact Psych: euthymic mood, full affect   EKG:  NSR, with RBBB. Rate of 60 bpm.   Recent Labs: 04/26/2017: Magnesium 2.1; TSH 2.253 04/28/2017:  Hemoglobin 13.7; Platelets 215 05/05/2017: ALT 19; BUN 14; Creatinine, Ser 1.37; Potassium 4.3; Sodium 139    Lipid Panel    Component Value Date/Time   CHOL 121 05/09/2017 0237   TRIG 74 May 09, 2017 0237   HDL 40 (L) 09-May-2017 0237   CHOLHDL 3.0 05-09-2017 0237   VLDL 15 05-09-2017 0237   LDLCALC 66 05/09/2017 0237      Wt Readings from Last 3 Encounters:  10/25/17 155 lb (70.3 kg)  07/19/17 155 lb (70.3 kg)  07/19/17 155 lb (70.3 kg)      Other studies Reviewed: Echocardiogram 05-09-17 Study Conclusions  - Left ventricle: The cavity size was normal. Wall thickness was   normal. Systolic function was normal. The estimated ejection   fraction was in the range of 60% to 65%. Wall motion was normal;   there were no regional wall motion  abnormalities. Doppler   parameters are consistent with abnormal left ventricular   relaxation (grade 1 diastolic dysfunction). - Aortic valve: There was no stenosis. - Aorta: Borderline dilated aortic root. Aortic root dimension: 38   mm (ED). - Mitral valve: Mildly calcified annulus. There was no significant   regurgitation. Valve area by pressure half-time: 2.29 cm^2. - Right ventricle: The cavity size was normal. Systolic function   was normal. - Tricuspid valve: Peak RV-RA gradient (S): 27 mm Hg. - Pulmonary arteries: PA peak pressure: 30 mm Hg (S). - Inferior vena cava: The vessel was normal in size. The   respirophasic diameter changes were in the normal range (>= 50%),   consistent with normal central venous pressure.  Impressions:  - Normal LV size with EF 60-65%. Normal RV size and systolic   function. No significant valvular abnormalities.  Cardiac cath 04/27/2017 Conclusion     Prox LAD lesion is 40% stenosed.  Ost Cx to Prox Cx lesion is 25% stenosed.  Mid LAD lesion is 50% stenosed.  The left ventricular systolic function is normal.  LV end diastolic pressure is normal.  The left ventricular ejection  fraction is 55-65% by visual estimate.  There is no aortic valve stenosis.  Mid Cx lesion is 95% stenosed.  A drug-eluting stent was successfully placed using a STENT SYNERGY DES 3.5X16.  Post intervention, there is a 0% residual stenosis.   Culprit circumflex lesion stented.  Double wire was needed to get balloon around proximal bend.  Guideliner was needed to get the stent around the proximal bend.  Would use EBU 3.5 Guide if cath needed in the future as EBU 3 did not provide great support.    COntinue DAPT for 1 year along with aggressive secondary prevention including smoking cessation.      ASSESSMENT AND PLAN:  1. CAD: S/P cardiac cath with intervention of the CX in 04/2017. He continues on DAPT with Brilinta. He has mild dyspnea when he takes it, but it has improved. I recommended that he drink a small amount of caffeine to help with these symptoms. He will be continued on current medication regimen.   2. Hypercholesterolemia;  He continues on statin therapy. Labs per PCP.  3. Ongoing tobacco abuse: Patent refuses to stop smoking. Will not entertain any conversations about cessation.   4. PAD: Continues to have chronic leg pain. No changes in regimen.  Current medicines are reviewed at length with the patient today.    Labs/ tests ordered today include: None  Kell Bettey Mare. Liborio Nixon, ANP, AACC   01/16/2018 2:29 PM    [facility] [address] Office [phone] Fax [phone]

## 2018-01-17 ENCOUNTER — Ambulatory Visit (INDEPENDENT_AMBULATORY_CARE_PROVIDER_SITE_OTHER): Payer: BLUE CROSS/BLUE SHIELD | Admitting: Adult Health

## 2018-01-17 ENCOUNTER — Encounter: Payer: Self-pay | Admitting: Adult Health

## 2018-01-17 ENCOUNTER — Encounter: Payer: BLUE CROSS/BLUE SHIELD | Admitting: *Deleted

## 2018-01-17 VITALS — BP 119/62 | HR 60 | Ht 68.0 in | Wt 154.0 lb

## 2018-01-17 VITALS — BP 119/62 | HR 60 | Wt 154.0 lb

## 2018-01-17 DIAGNOSIS — I2510 Atherosclerotic heart disease of native coronary artery without angina pectoris: Secondary | ICD-10-CM

## 2018-01-17 DIAGNOSIS — I739 Peripheral vascular disease, unspecified: Secondary | ICD-10-CM

## 2018-01-17 DIAGNOSIS — Z720 Tobacco use: Secondary | ICD-10-CM | POA: Diagnosis not present

## 2018-01-17 DIAGNOSIS — I10 Essential (primary) hypertension: Secondary | ICD-10-CM | POA: Diagnosis not present

## 2018-01-17 DIAGNOSIS — Z006 Encounter for examination for normal comparison and control in clinical research program: Secondary | ICD-10-CM

## 2018-01-17 DIAGNOSIS — E7800 Pure hypercholesterolemia, unspecified: Secondary | ICD-10-CM | POA: Diagnosis not present

## 2018-01-17 NOTE — Research (Signed)
Pt came in today for visit 10 of [employer] research study.  No complaints of cp or sob.  Him and his wife are fighting a cold.  No changes in his meds.  Pt resigned consent today.   Subject met inclusion and exclusion criteria. The informed consent form, study requirements and expectation were reviewed with the subject and questions and concerns were addressed prior to the signing of the consent form. The subject verbalized understanding of the trial requirements. The subject agreed to participate in the [employer] II trial and singed the informed consent. The informed consent was obtained prior to performance of any protocol-specific procedure for the subject. A copy of the signed informed consent was given to the subject and a copy was placed in the subject's medical record.  Subject re-consented to [place]  Version 2.0 IRB approved 06/29/2017                                   "CONSENT"   YES     NO   Continuing further Investigational Product and study visits for follow-up? '[x]'  '[]'   Continuing consent from future biomedical research '[x]'  '[]'                                   "EVENTS"    YES     NO  AE   (IF YES SEE SOURCE) '[]'  '[x]'   SAE  (IF YES SEE SOURCE) '[]'  '[x]'   ENDPOINT   (IF YES SEE SOURCE) '[]'  '[x]'   REVASCULARIZATION  (IF YES SEE SOURCE) '[]'  '[x]'   AMPUTATION   (IF YES SEE SOURCE) '[]'  '[x]'   TROPONIN'S  (IF YES SEE SOURCE) '[]'  '[x]'     Current Outpatient Medications:  .  acetaminophen (TYLENOL) 500 MG tablet, Take 1,000 mg by mouth every 6 (six) hours as needed for moderate pain., Disp: , Rfl:  .  aspirin 81 MG chewable tablet, Chew 1 tablet (81 mg total) by mouth daily., Disp: , Rfl:  .  atorvastatin (LIPITOR) 80 MG tablet, TAKE 1 TABLET BY MOUTH EVERY DAY AT 6PM, Disp: 90 tablet, Rfl: 0 .  donepezil (ARICEPT) 5 MG tablet, TAKE ONE TABLET (5 MG TOTAL) BY MOUTH EVERY MORNING., Disp: , Rfl: 3 .  DULoxetine (CYMBALTA) 60 MG capsule, Take 60 mg by mouth daily., Disp: , Rfl:  .  ipratropium (ATROVENT) 0.06 %  nasal spray, Place 1 spray into the nose 3 (three) times daily as needed., Disp: , Rfl: 6 .  isosorbide mononitrate (IMDUR) 30 MG 24 hr tablet, TAKE 1 TABLET DAILY, Disp: 90 tablet, Rfl: 3 .  meloxicam (MOBIC) 7.5 MG tablet, Take by mouth., Disp: , Rfl:  .  Menthol-Methyl Salicylate (MUSCLE RUB) 10-15 % CREA, Apply 1 application topically as needed for muscle pain., Disp: , Rfl:  .  metoprolol succinate (TOPROL XL) 25 MG 24 hr tablet, Take 0.5 tablets (12.5 mg total) by mouth daily., Disp: 90 tablet, Rfl: 3 .  nitroGLYCERIN (NITROSTAT) 0.4 MG SL tablet, Place 1 tablet (0.4 mg total) under the tongue every 5 (five) minutes as needed for chest pain., Disp: 25 tablet, Rfl: 2 .  pravastatin (PRAVACHOL) 80 MG tablet, Take 80 mg by mouth daily., Disp: , Rfl:  .  ranitidine (ZANTAC) 150 MG capsule, Take 150 mg by mouth 2 (two) times daily., Disp: , Rfl:  .  tamsulosin (FLOMAX) 0.4 MG CAPS,  Take 0.4 mg by mouth daily., Disp: , Rfl:  .  ticagrelor (BRILINTA) 90 MG TABS tablet, Take 1 tablet (90 mg total) by mouth 2 (two) times daily., Disp: 60 tablet, Rfl: 4

## 2018-01-17 NOTE — Patient Instructions (Signed)
Medication Instructions:  NO CHANGES- Your physician recommends that you continue on your current medications as directed. Please refer to the Current Medication list given to you today.  If you need a refill on your cardiac medications before your next appointment, please call your pharmacy.  Follow-Up: At [facility], you and your health needs are our priority.  As part of our continuing mission to provide you with exceptional heart care, we have created designated Provider Care Teams.  These Care Teams include your primary Cardiologist (physician) and Advanced Practice Providers (APPs -  Physician Assistants and Nurse Practitioners) who all work together to provide you with the care you need, when you need it. . You will need a follow up appointment in 6 MONTHS.  Please call our office 2 months in advance(FEB 2020) to schedule this appointment.  You may see  DR Tresa Endo or one of the following Advanced Practice Providers on your designated Care Team:   . Theodore Demark, PA-C . Joni Reining, DNP, ANP   Thank you for choosing [facility] at [facility]!!

## 2018-01-18 DIAGNOSIS — J209 Acute bronchitis, unspecified: Secondary | ICD-10-CM | POA: Diagnosis not present

## 2018-01-18 DIAGNOSIS — I10 Essential (primary) hypertension: Secondary | ICD-10-CM | POA: Diagnosis not present

## 2018-04-09 IMAGING — CR DG CHEST 1V PORT
1 series · 1 of 1 positions shown · non-contrast
Comparison: Portable exam 8163 hours compared to 11/11/2011

CLINICAL DATA: Substernal non radiating chest pain, EKG depression
leads in 4-6, history hypertension, RIGHT bundle branch block,
hyperlipidemia, smoker

EXAM:
PORTABLE CHEST 1 VIEW

[AP]
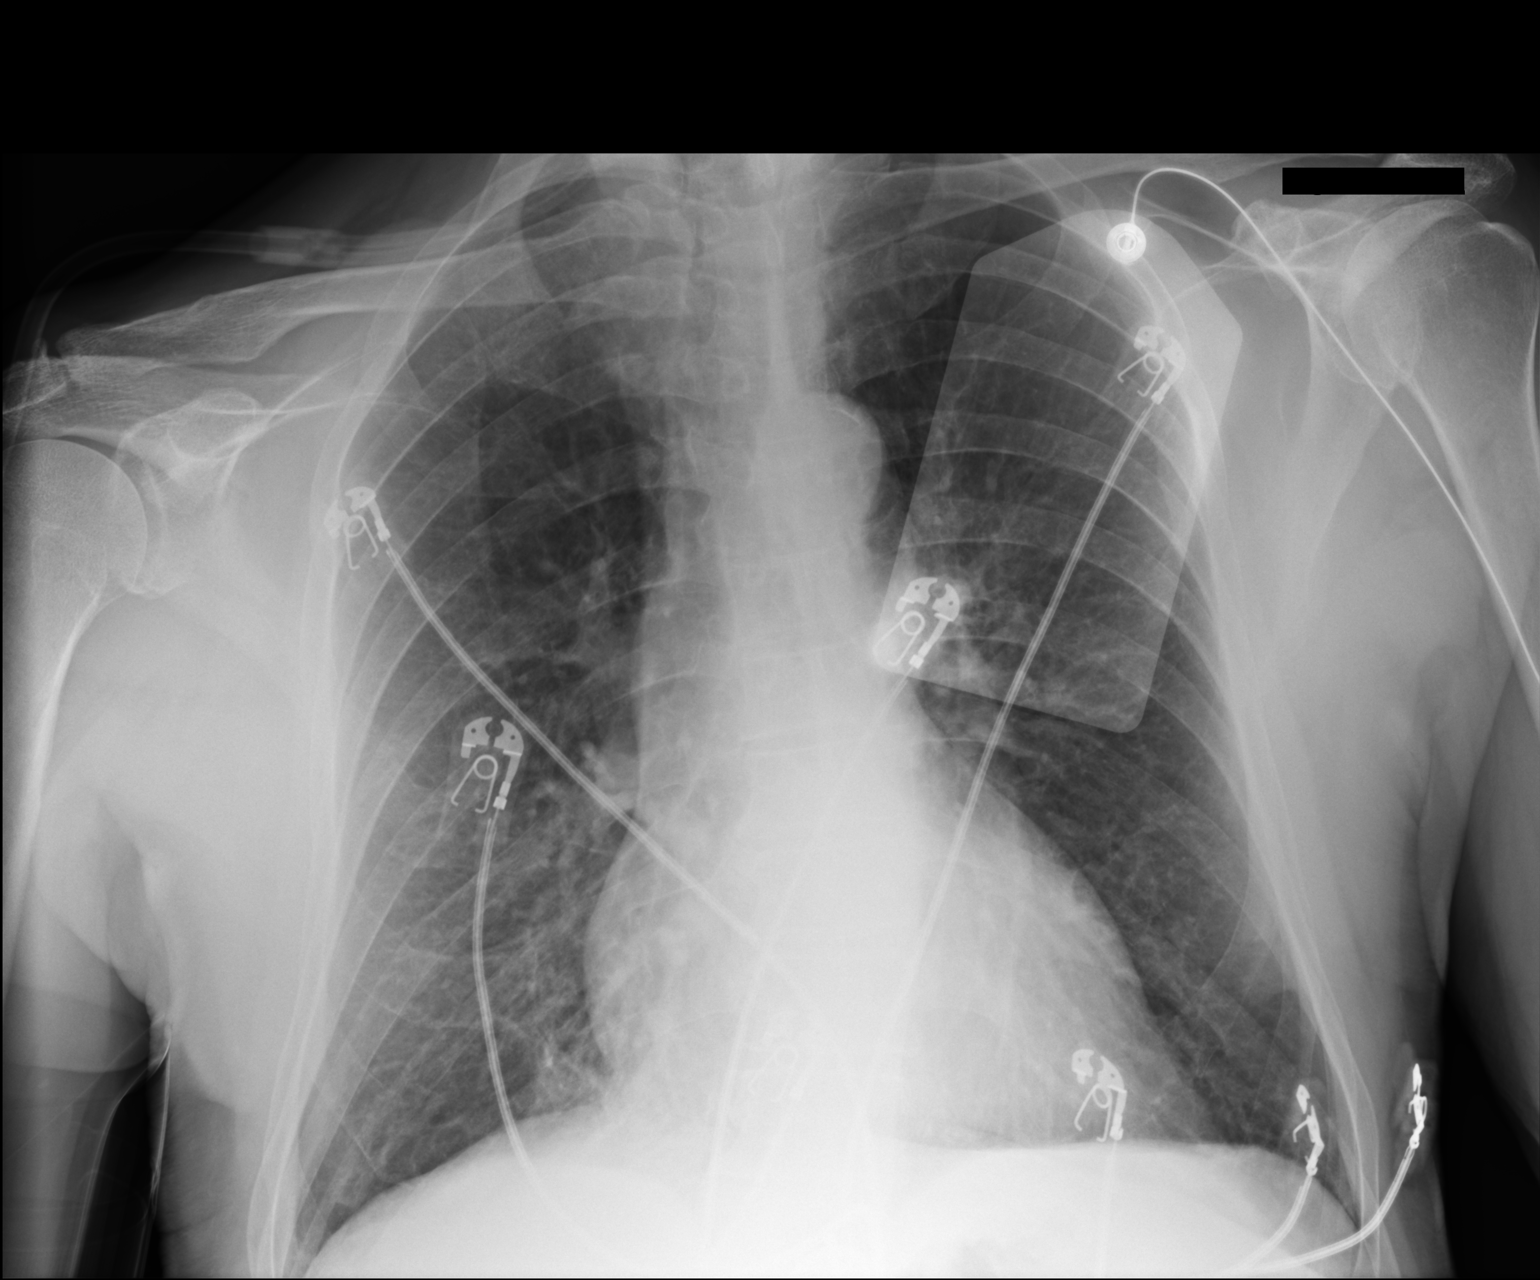

[1 of 1 positions shown; findings below may reference images not displayed]

FINDINGS: External pacing lead projects over LEFT lung.

Normal heart size, mediastinal contours, and pulmonary vascularity.

Atherosclerotic calcification aorta.

Minimal RIGHT basilar atelectasis.

Lungs otherwise clear.

No pleural effusion or pneumothorax.

No acute osseous findings.
IMPRESSION: Minimal RIGHT basilar atelectasis.

## 2018-04-14 DIAGNOSIS — H6500 Acute serous otitis media, unspecified ear: Secondary | ICD-10-CM | POA: Diagnosis not present

## 2018-04-22 ENCOUNTER — Telehealth: Payer: Self-pay | Admitting: *Deleted

## 2018-04-22 DIAGNOSIS — K219 Gastro-esophageal reflux disease without esophagitis: Secondary | ICD-10-CM | POA: Diagnosis not present

## 2018-04-22 DIAGNOSIS — Z23 Encounter for immunization: Secondary | ICD-10-CM | POA: Diagnosis not present

## 2018-04-22 DIAGNOSIS — I739 Peripheral vascular disease, unspecified: Secondary | ICD-10-CM | POA: Diagnosis not present

## 2018-04-22 DIAGNOSIS — F17210 Nicotine dependence, cigarettes, uncomplicated: Secondary | ICD-10-CM | POA: Diagnosis not present

## 2018-04-22 DIAGNOSIS — I2510 Atherosclerotic heart disease of native coronary artery without angina pectoris: Secondary | ICD-10-CM | POA: Diagnosis not present

## 2018-04-22 DIAGNOSIS — E785 Hyperlipidemia, unspecified: Secondary | ICD-10-CM | POA: Diagnosis not present

## 2018-04-22 DIAGNOSIS — I10 Essential (primary) hypertension: Secondary | ICD-10-CM | POA: Diagnosis not present

## 2018-04-22 NOTE — Telephone Encounter (Signed)
PT CALLED BACK AND SCHEDULED APPT

## 2018-04-28 ENCOUNTER — Encounter: Payer: BLUE CROSS/BLUE SHIELD | Admitting: *Deleted

## 2018-04-28 VITALS — BP 112/49 | HR 62 | Wt 154.2 lb

## 2018-04-28 DIAGNOSIS — Z006 Encounter for examination for normal comparison and control in clinical research program: Secondary | ICD-10-CM

## 2018-04-28 NOTE — Research (Signed)
Visit 11   Pt doing well, no complaints of cp or sob. No med changes.                                     "CONSENT"   YES     NO   Continuing further Investigational Product and study visits for follow-up? [x]  []   Continuing consent from future biomedical research [x]  []                                    "EVENTS"    YES     NO  AE   (IF YES SEE SOURCE) []  [x]   SAE  (IF YES SEE SOURCE) []  [x]   ENDPOINT   (IF YES SEE SOURCE) []  [x]   REVASCULARIZATION  (IF YES SEE SOURCE) []  [x]   AMPUTATION   (IF YES SEE SOURCE) []  [x]   TROPONIN'S  (IF YES SEE SOURCE) []  [x]    Lifestyle Adherence Assessment:    YES NO  Abstinence from smoking/remaining tobacco free X   Cardiac Diet X   Routine physical activity and/or cardiac rehabilitation X     Current Outpatient Medications:  .  acetaminophen (TYLENOL) 500 MG tablet, Take 1,000 mg by mouth every 6 (six) hours as needed for moderate pain., Disp: , Rfl:  .  albuterol (PROVENTIL HFA;VENTOLIN HFA) 108 (90 Base) MCG/ACT inhaler, Inhale into the lungs., Disp: , Rfl:  .  amoxicillin (AMOXIL) 500 MG capsule, Take by mouth., Disp: , Rfl:  .  aspirin 81 MG chewable tablet, Chew 1 tablet (81 mg total) by mouth daily., Disp: , Rfl:  .  atorvastatin (LIPITOR) 80 MG tablet, TAKE 1 TABLET BY MOUTH EVERY DAY AT 6PM, Disp: 90 tablet, Rfl: 0 .  donepezil (ARICEPT) 5 MG tablet, TAKE ONE TABLET (5 MG TOTAL) BY MOUTH EVERY MORNING., Disp: , Rfl: 3 .  DULoxetine (CYMBALTA) 60 MG capsule, Take 60 mg by mouth daily., Disp: , Rfl:  .  ipratropium (ATROVENT) 0.06 % nasal spray, Place 1 spray into the nose 3 (three) times daily as needed., Disp: , Rfl: 6 .  isosorbide mononitrate (IMDUR) 30 MG 24 hr tablet, TAKE 1 TABLET DAILY, Disp: 90 tablet, Rfl: 3 .  meloxicam (MOBIC) 7.5 MG tablet, Take by mouth., Disp: , Rfl:  .  Menthol-Methyl Salicylate (MUSCLE RUB) 10-15 % CREA, Apply 1 application topically as needed for muscle pain., Disp: , Rfl:  .  metoprolol succinate (TOPROL  XL) 25 MG 24 hr tablet, Take 0.5 tablets (12.5 mg total) by mouth daily., Disp: 90 tablet, Rfl: 3 .  nitroGLYCERIN (NITROSTAT) 0.4 MG SL tablet, Place 1 tablet (0.4 mg total) under the tongue every 5 (five) minutes as needed for chest pain., Disp: 25 tablet, Rfl: 2 .  pravastatin (PRAVACHOL) 80 MG tablet, Take 80 mg by mouth daily., Disp: , Rfl:  .  ranitidine (ZANTAC) 150 MG capsule, Take 150 mg by mouth 2 (two) times daily., Disp: , Rfl:  .  tamsulosin (FLOMAX) 0.4 MG CAPS, Take 0.4 mg by mouth daily., Disp: , Rfl:  .  ticagrelor (BRILINTA) 90 MG TABS tablet, Take 1 tablet (90 mg total) by mouth 2 (two) times daily., Disp: 60 tablet, Rfl: 4    .

## 2018-07-12 DIAGNOSIS — F3342 Major depressive disorder, recurrent, in full remission: Secondary | ICD-10-CM | POA: Diagnosis not present

## 2018-07-12 DIAGNOSIS — I2510 Atherosclerotic heart disease of native coronary artery without angina pectoris: Secondary | ICD-10-CM | POA: Diagnosis not present

## 2018-07-12 DIAGNOSIS — K219 Gastro-esophageal reflux disease without esophagitis: Secondary | ICD-10-CM | POA: Diagnosis not present

## 2018-07-28 ENCOUNTER — Other Ambulatory Visit: Payer: Self-pay | Admitting: Cardiovascular Disease

## 2018-07-28 NOTE — Telephone Encounter (Signed)
Metoprolol succ 25 mg refilled. 

## 2018-09-20 DIAGNOSIS — E785 Hyperlipidemia, unspecified: Secondary | ICD-10-CM | POA: Diagnosis not present

## 2018-09-20 DIAGNOSIS — I10 Essential (primary) hypertension: Secondary | ICD-10-CM | POA: Diagnosis not present

## 2018-09-22 DIAGNOSIS — I10 Essential (primary) hypertension: Secondary | ICD-10-CM | POA: Diagnosis not present

## 2018-09-22 DIAGNOSIS — N400 Enlarged prostate without lower urinary tract symptoms: Secondary | ICD-10-CM | POA: Diagnosis not present

## 2018-09-22 DIAGNOSIS — I2510 Atherosclerotic heart disease of native coronary artery without angina pectoris: Secondary | ICD-10-CM | POA: Diagnosis not present

## 2018-09-22 DIAGNOSIS — F3342 Major depressive disorder, recurrent, in full remission: Secondary | ICD-10-CM | POA: Diagnosis not present

## 2018-09-22 DIAGNOSIS — J449 Chronic obstructive pulmonary disease, unspecified: Secondary | ICD-10-CM | POA: Diagnosis not present

## 2018-11-01 ENCOUNTER — Telehealth: Payer: Self-pay | Admitting: Cardiovascular Disease

## 2018-11-01 NOTE — Telephone Encounter (Signed)
I called pt and LVM , reminding him of his appt for 11-01-18 with Dr Claiborne Billings. I also left a message for pt to call back to be pre-screened.

## 2018-11-02 ENCOUNTER — Ambulatory Visit: Payer: BLUE CROSS/BLUE SHIELD | Admitting: Cardiovascular Disease

## 2018-11-22 ENCOUNTER — Telehealth: Payer: Self-pay | Admitting: Cardiovascular Disease

## 2018-11-22 NOTE — Telephone Encounter (Signed)
Cardiologist has to okay the stop or change of all antiplatelets.

## 2018-11-22 NOTE — Telephone Encounter (Signed)
New Message    Pt c/o medication issue:  1. Name of Medication: Brilinta  2. How are you currently taking this medication (dosage and times per day)? 90mg  1 tablet by mouth twice a day  3. Are you having a reaction (difficulty breathing--STAT)? NO  4. What is your medication issue? Patient states he's been on Brilinta for one and a half years and wants to know when he can stop taking the medication.

## 2018-11-24 NOTE — Telephone Encounter (Signed)
Follow up:     Patient calling back from yesterday stating that he is waiting on some one call back. Please call patient back.

## 2018-11-24 NOTE — Telephone Encounter (Signed)
New Message     Patient calling back would like to speak to Dr. Claiborne Billings.  Please call patient back with update of what's going on.

## 2018-11-24 NOTE — Telephone Encounter (Signed)
Patient has not been seen since April 2019.  Would recommend continuation of aspirin and Plavix.  He may be worthwhile for a follow-up office visit with APP or myself prior to making any medication adjustments

## 2018-11-24 NOTE — Telephone Encounter (Signed)
Called patient, advised that message went sent to [facility], he is out of the office this week, but would route a message and notify him to look and advise and I would call back with an answer.  Thank you!

## 2018-11-24 NOTE — Telephone Encounter (Signed)
Attempted to contact patient back again, 2nd attempt.  LVM advising to call back. Left call back number.

## 2018-11-24 NOTE — Telephone Encounter (Signed)
Patient returned call- stating that he has been on this medication for a while, and just wondering if he could come off of the medication or be placed on something else. I advised that Dr.Kelly was rounding in the hospital and would respond as soon as he could, but he has to make the decision.

## 2018-11-25 NOTE — Telephone Encounter (Signed)
Attempted to contact patient to notify, patient did not answer.  Unable to leave message, will call back later.

## 2018-12-02 NOTE — Telephone Encounter (Signed)
Called patient, LVM, advised to call back to discuss message from [facility] and to hopefully get patient scheduled. Left call back number.

## 2018-12-02 NOTE — Telephone Encounter (Signed)
Patient called back and advised of message from [facility]. patient suggested he wanted to be seen, advised that he did not want to wear a mask and would do a virtual, had a cancellation for virtual visit next week Tuesday with Dr.Kelly, patient took this appointment for a virtual visit to discuss.

## 2018-12-06 ENCOUNTER — Other Ambulatory Visit: Payer: Self-pay | Admitting: Cardiovascular Disease

## 2018-12-06 ENCOUNTER — Telehealth: Payer: Self-pay | Admitting: Cardiovascular Disease

## 2018-12-06 ENCOUNTER — Encounter: Payer: Self-pay | Admitting: Cardiovascular Disease

## 2018-12-06 ENCOUNTER — Telehealth (INDEPENDENT_AMBULATORY_CARE_PROVIDER_SITE_OTHER): Payer: BC Managed Care – PPO | Admitting: Cardiovascular Disease

## 2018-12-06 VITALS — BP 140/72 | HR 71 | Ht 68.0 in | Wt 152.0 lb

## 2018-12-06 DIAGNOSIS — I10 Essential (primary) hypertension: Secondary | ICD-10-CM | POA: Diagnosis not present

## 2018-12-06 DIAGNOSIS — I6523 Occlusion and stenosis of bilateral carotid arteries: Secondary | ICD-10-CM

## 2018-12-06 DIAGNOSIS — I739 Peripheral vascular disease, unspecified: Secondary | ICD-10-CM | POA: Diagnosis not present

## 2018-12-06 DIAGNOSIS — F17210 Nicotine dependence, cigarettes, uncomplicated: Secondary | ICD-10-CM

## 2018-12-06 DIAGNOSIS — Z720 Tobacco use: Secondary | ICD-10-CM

## 2018-12-06 DIAGNOSIS — I2510 Atherosclerotic heart disease of native coronary artery without angina pectoris: Secondary | ICD-10-CM

## 2018-12-06 DIAGNOSIS — I4510 Unspecified right bundle-branch block: Secondary | ICD-10-CM | POA: Diagnosis not present

## 2018-12-06 NOTE — Patient Instructions (Signed)
Medication Instructions:  The current medical regimen is effective;  continue present plan and medications.  If you need a refill on your cardiac medications before your next appointment, please call your pharmacy.   Labs:  Fasting lab work (CBC, CMET, TSH, LIPID) Please come in anytime to have your labs drawn.   They are fasting labs, so nothing to eat or drink after midnight.  Lab hours: 8:00-4:00 lunch hours 12:45-1:45   Testing/Procedures: Your physician has requested that you have a lower or upper extremity arterial duplex. This test is an ultrasound of the arteries in the legs or arms. It looks at arterial blood flow in the legs and arms. Allow one hour for Lower and Upper Arterial scans. There are no restrictions or special instructions  Follow-Up: At [facility], you and your health needs are our priority.  As part of our continuing mission to provide you with exceptional heart care, we have created designated Provider Care Teams.  These Care Teams include your primary Cardiologist (physician) and Advanced Practice Providers (APPs -  Physician Assistants and Nurse Practitioners) who all work together to provide you with the care you need, when you need it. You will need a follow up appointment in 3-4 months. You may see Shelva Majestic, MD or one of the following Advanced Practice Providers on your designated Care Team: [place], [place] . Fabian Sharp, PA-C

## 2018-12-06 NOTE — Telephone Encounter (Signed)
Called home number that just rang.  Called mobile number and left a VM to call and schedule LEA doppler and 3-4 month followup with Dr. Claiborne Billings.

## 2018-12-06 NOTE — Progress Notes (Signed)
Virtual Visit via Telephone Note   This visit type was conducted due to national recommendations for restrictions regarding the COVID-19 Pandemic (e.g. social distancing) in an effort to limit this patient's exposure and mitigate transmission in our community.  Due to his co-morbid illnesses, this patient is at least at moderate risk for complications without adequate follow up.  This format is felt to be most appropriate for this patient at this time.  The patient did not have access to video technology/had technical difficulties with video requiring transitioning to audio format only (telephone).  All issues noted in this document were discussed and addressed.  No physical exam could be performed with this format.  Please refer to the patient's chart for his  consent to telehealth for [facility]   Date:  12/07/2018   ID:  Ralph Dawson, DOB 1940-11-29, MRN [id number]  Patient Location: Home Provider Location: Home  PCP:  Joette Catching, MD  Cardiologist:  Nicki Guadalajara, MD  Electrophysiologist:  None   Evaluation Performed:  Follow-Up Visit  Chief Complaint:  16 month F/U   History of Present Illness:    Ralph Dawson is a 78 y.o. male who has documented PVD and underwent left carotid endarterectomy in September 2009 and in April 2011 underwent right carotid endarterectomy. He has a history of lower extremity intermittent claudication, hyperlipidemia, as well as mild valvular heart disease with mild mitral annular calcification with mild MR, mild TR, and mild aortic valve sclerosis with normal systolic and diastolic function. A nuclear perfusion study November 2011 showed normal perfusion. He has had GERD symptoms which have improved with pantoprazole. He was started on pravastatin 80 mg by Dr. Lysbeth Galas and last year his LDL cholesterol  was 65. He does have documented right bundle branch block.    When I saw him he denied any episodes of chest pain applications.. There is a  long-standing tobacco history and continues to smoke, now only 8 cigarettes per day. He has noticed some mild shortness of breath, particularly with significant activity.  He has a history of GERD for which he takes protonix.  He has been taking Cymbalta for depression.  In November 2015 an echo Doppler study showed mild left ventricular hypertrophy with hyperdynamic LV function and ejection fraction of 65-70%.  There was grade 1 diastolic dysfunction.  Unfortunately he continues to smoke and has been smoking for 70 years.  He was seen by Azalee Course in January 2018. In April 27 2017 he underwent cardiac catheterization by Dr. Eldridge Dace found to have a 95% proximal circumflex stenosis which required PCI and use of a Guideliner to get the stent around the proximal bend in the circumflex vessel.  Echo Doppler study revealed an EF of 60-65%.  There were no significant valvular abnormalities.    Since I last saw him in April 2019, he was evaluated by Joni Reining in October 2019.  At that time he was without complaints from a cardiac standpoint.  He had been treated for pneumonia by his primary physician.  Unfortunately he was still smoking cigarettes.  He did note occasional shortness of breath when taking Brilinta but otherwise remained stable.    Since his last evaluation unfortunately continues to smoke and smokes about 5 to 6 cigarettes/day.  He has felt well with reference to any chest pain or palpitations.  However, he has definitely noticed development of left calf clot patient particularly with walking.  He remains active outside doing yard work.  He does  admits to some shortness of breath with activity and although he was told that he may have COPD he does not believe this.  He denies any leg swelling.  He denies any PND orthopnea.  He has not had recent laboratory.  He presents for reevaluation.  The patient does not have symptoms concerning for COVID-19 infection (fever, chills, cough, or new  shortness of breath).    Past Medical History:  Diagnosis Date  . Arthritis    "hands, feet" (04/27/2017)  . Carotid artery occlusion   . Carotid stenosis 09/24/2011   R ICA patent w/ hx of endarterectomy, stenosis 1-39%;  L ICA patent w/ hx of endarterectomy; external carotids appear patent; see imaging tab for full report  . Chest heaviness 02/25/2009   PVCs, atrial bigeminy  . Claudication (HCC) 06/17/2011   LE doppler - bilateral ABIs normal values at rest; R CIA >50% diameter reduction L CIA 0-49% reduction; bilateral SFAs mild/mod mixed density plaque throughout suggesting 50-69% diameter reduction  . GERD (gastroesophageal reflux disease)   . Hyperlipidemia   . Hypertension 02/13/2010   echo - EF >55%; mild mitral annular calcification; mild aortic valve sclerosis  . NSTEMI (non-ST elevated myocardial infarction) (HCC) 04/26/2017   [name]/notes 04/27/2017  . RBBB (right bundle branch block) 02/13/2010   R/P MV - EF 67%; normal perfusion all regions; no significant wall abnormalties noted   Past Surgical History:  Procedure Laterality Date  . APPENDECTOMY    . ARTERIAL BYPASS SURGRY     pt unaware of this OR on 04/27/2017  . BACK SURGERY    . CAROTID ENDARTERECTOMY Right 07/15/2009   Right CEA  . CAROTID ENDARTERECTOMY Left 12/13/2007   Left CEA  . CORONARY ANGIOPLASTY WITH STENT PLACEMENT  04/27/2017  . CORONARY STENT INTERVENTION N/A 04/27/2017   Procedure: CORONARY STENT INTERVENTION;  Surgeon: Corky CraftsVaranasi, Jayadeep S, MD;  [facility];  Service: Cardiovascular;  Laterality: N/A;  . LAPAROSCOPIC CHOLECYSTECTOMY    . LEFT HEART CATH AND CORONARY ANGIOGRAPHY N/A 04/27/2017   Procedure: LEFT HEART CATH AND CORONARY ANGIOGRAPHY;  Surgeon: Corky CraftsVaranasi, Jayadeep S, MD;  [facility];  Service: Cardiovascular;  Laterality: N/A;  . LUMBAR DISC SURGERY    . TONSILLECTOMY       Current Meds  Medication Sig  . acetaminophen (TYLENOL) 500 MG tablet Take 1,000 mg  by mouth every 6 (six) hours as needed for moderate pain.  Marland Kitchen. albuterol (PROVENTIL HFA;VENTOLIN HFA) 108 (90 Base) MCG/ACT inhaler Inhale into the lungs.  Marland Kitchen. aspirin 81 MG chewable tablet Chew 1 tablet (81 mg total) by mouth daily.  Marland Kitchen. donepezil (ARICEPT) 5 MG tablet TAKE ONE TABLET (5 MG TOTAL) BY MOUTH EVERY MORNING.  . DULoxetine (CYMBALTA) 60 MG capsule Take 60 mg by mouth daily.  Marland Kitchen. ipratropium (ATROVENT) 0.06 % nasal spray Place 1 spray into the nose 3 (three) times daily as needed.  . isosorbide mononitrate (IMDUR) 30 MG 24 hr tablet TAKE 1 TABLET DAILY  . Menthol-Methyl Salicylate (MUSCLE RUB) 10-15 % CREA Apply 1 application topically as needed for muscle pain.  . metoprolol succinate (TOPROL-XL) 25 MG 24 hr tablet TAKE 1 TABLET BY MOUTH EVERY DAY  . nitroGLYCERIN (NITROSTAT) 0.4 MG SL tablet Place 1 tablet (0.4 mg total) under the tongue every 5 (five) minutes as needed for chest pain.  . pravastatin (PRAVACHOL) 80 MG tablet Take 80 mg by mouth daily.  . ranitidine (ZANTAC) 150 MG capsule Take 150 mg by mouth  2 (two) times daily.  . tamsulosin (FLOMAX) 0.4 MG CAPS Take 0.4 mg by mouth daily.  . ticagrelor (BRILINTA) 90 MG TABS tablet Take 1 tablet (90 mg total) by mouth 2 (two) times daily.     Allergies:   Patient has no known allergies.   Social History   Tobacco Use  . Smoking status: Current Some Day Smoker    Packs/day: 0.25    Years: 65.00    Pack years: 16.25    Types: Cigarettes    Last attempt to quit: 06/14/2017    Years since quitting: 1.4  . Smokeless tobacco: Never Used  . Tobacco comment: smoking a 10 cigarettes a week   Substance Use Topics  . Alcohol use: No  . Drug use: No     Family Hx: The patient's family history includes Cancer in his daughter, mother, and sister; Diabetes in his brother.  ROS:   Please see the history of present illness.    Negative fever chills or night sweats. Mild shortness of breath with activity but is able to work outside his  yard without difficulty. He denies any significant wheezing No chest pain PND orthopnea or palpitations Positive for left calf claudication with walking He admits to occasional shortness of breath following taking a dose of Brilinta which improves with time No bleeding No GU symptoms No neurologic symptoms He believes he is sleeping well All other systems reviewed and are negative.   Prior CV studies:   The following studies were reviewed today:  Echocardiogram 04/27/2017 Study Conclusions  - Left ventricle: The cavity size was normal. Wall thickness was normal. Systolic function was normal. The estimated ejection fraction was in the range of 60% to 65%. Wall motion was normal; there were no regional wall motion abnormalities. Doppler parameters are consistent with abnormal left ventricular relaxation (grade 1 diastolic dysfunction). - Aortic valve: There was no stenosis. - Aorta: Borderline dilated aortic root. Aortic root dimension: 38 mm (ED). - Mitral valve: Mildly calcified annulus. There was no significant regurgitation. Valve area by pressure half-time: 2.29 cm^2. - Right ventricle: The cavity size was normal. Systolic function was normal. - Tricuspid valve: Peak RV-RA gradient (S): 27 mm Hg. - Pulmonary arteries: PA peak pressure: 30 mm Hg (S). - Inferior vena cava: The vessel was normal in size. The respirophasic diameter changes were in the normal range (>= 50%), consistent with normal central venous pressure.  Impressions:  - Normal LV size with EF 60-65%. Normal RV size and systolic function. No significant valvular abnormalities.  Cardiac cath 04/27/2017 Conclusion     Prox LAD lesion is 40% stenosed.  Ost Cx to Prox Cx lesion is 25% stenosed.  Mid LAD lesion is 50% stenosed.  The left ventricular systolic function is normal.  LV end diastolic pressure is normal.  The left ventricular ejection fraction is 55-65% by visual  estimate.  There is no aortic valve stenosis.  Mid Cx lesion is 95% stenosed.  A drug-eluting stent was successfully placed using a STENT SYNERGY DES 3.5X16.  Post intervention, there is a 0% residual stenosis.  Culprit circumflex lesion stented. Double wire was needed to get balloon around proximal bend. Guideliner was needed to get the stent around the proximal bend. Would use EBU 3.5 Guide if cath needed in the future as EBU 3 did not provide great support.   COntinue DAPT for 1 year along with aggressive secondary prevention including smoking cessation.       Labs/Other Tests and Data  Reviewed:    EKG:  An ECG dated 01/18/2018 was personally reviewed today and demonstrated:  Normal sinus rhythm at 68 with right bundle branch block and repolarization changes  Recent Labs: No results found for requested labs within last 8760 hours.   Recent Lipid Panel Lab Results  Component Value Date/Time   CHOL 121 04/27/2017 02:37 AM   TRIG 74 04/27/2017 02:37 AM   HDL 40 (L) 04/27/2017 02:37 AM   CHOLHDL 3.0 04/27/2017 02:37 AM   LDLCALC 66 04/27/2017 02:37 AM    Wt Readings from Last 3 Encounters:  12/06/18 152 lb (68.9 kg)  04/28/18 154 lb 3.2 oz (69.9 kg)  01/17/18 154 lb (69.9 kg)     Objective:    Vital Signs:  BP 140/72   Pulse 71   Ht 5\' 8"  (1.727 m)   Wt 152 lb (68.9 kg)   BMI 23.11 kg/m    Since this was a phone video conference I could not actually see the patient. However, he states his appearance is similar to previous evaluations Breathing is normal and not labored Voice is raspy consistent with long-term tobacco use There is no audible wheezing He denied any discomfort to his chest with palpation Heart rhythm was regular he did not feel any skipped beats Abdomen was soft and nontender He denied any leg swelling He denied any neurologic weakness He had normal affect and cognition  ASSESSMENT & PLAN:    1. CAD: He denies any recent anginal  symptomatology.  His last catheterization in January 2019 revealed high-grade 95% stenosis of the circumflex vessel proximal band which was successfully stented in 2019.  He has concomitant CAD with 40 to 50% proximal mid LAD stenoses in addition to 25% proximal circumflex stenosis.  He has continued to be on DAPT therapy with aspirin and Brilinta. 2. Essential hypertension: At present he is on isosorbide 30 mg, Toprol-XL 25 mg daily.  BP today was 140/72.  I discussed with him target blood pressure less than 130/80 with optimal less than 120/80. 3. PVD: He underwent carotid endarterectomies in 2009 and in 2011.  He is experiencing lower extremity claudication particularly involving the left calf.  I will schedule him to undergo lower extremity arterial duplex imaging for further evaluation.  He had just renewed his Brilinta and apparently has several months of supply.  When his current Brilinta runs out several months I will switch him to Plavix to take along with aspirin for continued treatment of his PVD particularly since he is noticing some shortness of breath immediately after taking his Brilinta dosing. 4. Hyperlipidemia: He is on high-dose pravastatin.  Target LDL is less than 70.  He is not had recent laboratory.  Fasting laboratory will be obtained. 5. Tobacco abuse: We discussed the importance of complete smoking cessation. 6. COPD: On Atrovent. 7. Right bundle branch block: Stable  COVID-19 Education: The signs and symptoms of COVID-19 were discussed with the patient and how to seek care for testing (follow up with PCP or arrange E-visit).  The importance of social distancing was discussed today.  Time:   Today, I have spent 26 minutes with the patient with telehealth technology discussing the above problems.     Medication Adjustments/Labs and Tests Ordered: Current medicines are reviewed at length with the patient today.  Concerns regarding medicines are outlined above.   Tests  Ordered: Orders Placed This Encounter  Procedures  . Comprehensive metabolic panel  . CBC  . Lipid panel  . TSH  .  VAS [place] LOWER EXTREMITY ARTERIAL DUPLEX    Medication Changes: No orders of the defined types were placed in this encounter.   Follow Up: We will schedule the patient for lower extremity arterial duplex imaging.  Complete set of fasting laboratory will be obtained.  I will see him in the office setting in 4 months for follow-up evaluation.  Signed, Shelva Majestic, MD  12/07/2018 6:35 AM    [facility]

## 2018-12-08 ENCOUNTER — Encounter (HOSPITAL_COMMUNITY): Payer: Self-pay | Admitting: Cardiology

## 2018-12-08 ENCOUNTER — Other Ambulatory Visit: Payer: Self-pay

## 2018-12-08 ENCOUNTER — Telehealth: Payer: Self-pay | Admitting: Cardiovascular Disease

## 2018-12-08 ENCOUNTER — Encounter (HOSPITAL_COMMUNITY): Payer: Self-pay

## 2018-12-08 ENCOUNTER — Ambulatory Visit (HOSPITAL_COMMUNITY)
Admission: RE | Admit: 2018-12-08 | Discharge: 2018-12-08 | Disposition: A | Discharge status: Home / Self Care | Payer: Medicare Other | Source: Ambulatory Visit | Attending: Cardiovascular Disease | Admitting: Cardiovascular Disease

## 2018-12-08 DIAGNOSIS — I739 Peripheral vascular disease, unspecified: Secondary | ICD-10-CM

## 2018-12-08 DIAGNOSIS — I10 Essential (primary) hypertension: Secondary | ICD-10-CM

## 2018-12-08 NOTE — Telephone Encounter (Signed)
Noted. Thanks.

## 2018-12-08 NOTE — Telephone Encounter (Signed)
Patient called. He was told he was not allowed to do his test on his legs this morning because he would not wear his mask the proper way. He says that the mask makes it harder for him to breathe.  Please call to discuss the mask issue with the patient.

## 2018-12-08 NOTE — Telephone Encounter (Signed)
Called pt he states that he did have a mask on when he arrive "they just did not like the mask that I was wearing". I will not wear a mask  I cannot breathe". At this time pt refuses to have this test done and wear the correct type of mask. He states that "it looks like I will have to move to another states that does not make me wear a mask." informed pt that I will inform Dr Claiborne Billings of this.

## 2018-12-08 NOTE — Progress Notes (Signed)
Patient arrived for lower arterial testing.  He was holding his mask over his mouth and partially over his nose, but refused to secure the mask over his ears.  Front Dietitian called back and informed tech that the patient refused to wear his mask, and I was asked to come speak with him.  He stated he would hold his mask during the test (scheduled for two hours), and I told him that it is the rules that his mask must be on and cover his mouth and nose.  He stated" but what if I can't breathe?  What if I have COPD?"  I told him that the rules are that he must wear the mask for the duration of his visit, and he said, "Ya'll have a nice day, " and left the office.

## 2018-12-08 NOTE — Telephone Encounter (Signed)
Patient is having probable significant claudication to his left calf.  I would encourage that he come and have his lower extremity arterial Doppler study.  We can provide him with a mask to wear for 30 minutes during the study.

## 2018-12-09 NOTE — Telephone Encounter (Signed)
Spoke with patient, advised of message from Dr.Kelly advising to come have testing done- and he would have to wear his mask.  Patient agreeable to reschedule,  Scheduling please schedule tests again and notify patient. Thank you!

## 2018-12-09 NOTE — Telephone Encounter (Signed)
Noted, thank you

## 2018-12-12 ENCOUNTER — Other Ambulatory Visit (HOSPITAL_COMMUNITY): Payer: Self-pay | Admitting: Cardiovascular Disease

## 2018-12-12 DIAGNOSIS — I739 Peripheral vascular disease, unspecified: Secondary | ICD-10-CM | POA: Diagnosis not present

## 2018-12-12 DIAGNOSIS — I10 Essential (primary) hypertension: Secondary | ICD-10-CM | POA: Diagnosis not present

## 2018-12-12 LAB — COMPREHENSIVE METABOLIC PANEL
ALT: 17 IU/L (ref 0–44)
AST: 17 IU/L (ref 0–40)
Albumin/Globulin Ratio: 1.6 (ref 1.2–2.2)
Albumin: 4.2 g/dL (ref 3.7–4.7)
Alkaline Phosphatase: 85 IU/L (ref 39–117)
BUN/Creatinine Ratio: 11 (ref 10–24)
BUN: 15 mg/dL (ref 8–27)
Bilirubin Total: 0.7 mg/dL (ref 0.0–1.2)
CO2: 23 mmol/L (ref 20–29)
Calcium: 9 mg/dL (ref 8.6–10.2)
Chloride: 107 mmol/L — ABNORMAL HIGH (ref 96–106)
Creatinine, Ser: 1.37 mg/dL — ABNORMAL HIGH (ref 0.76–1.27)
GFR calc Af Amer: 57 mL/min/{1.73_m2} — ABNORMAL LOW (ref >59)
GFR calc non Af Amer: 49 mL/min/{1.73_m2} — ABNORMAL LOW (ref >59)
Globulin, Total: 2.6 g/dL (ref 1.5–4.5)
Glucose: 105 mg/dL — ABNORMAL HIGH (ref 65–99)
Potassium: 4.6 mmol/L (ref 3.5–5.2)
Sodium: 141 mmol/L (ref 134–144)
Total Protein: 6.8 g/dL (ref 6.0–8.5)

## 2018-12-12 LAB — CBC
Hematocrit: 41 % (ref 37.5–51.0)
Hemoglobin: 13.9 g/dL (ref 13.0–17.7)
MCH: 31.8 pg (ref 26.6–33.0)
MCHC: 33.9 g/dL (ref 31.5–35.7)
MCV: 94 fL (ref 79–97)
Platelets: 240 10*3/uL (ref 150–450)
RBC: 4.37 x10E6/uL (ref 4.14–5.80)
RDW: 12 % (ref 11.6–15.4)
WBC: 9 10*3/uL (ref 3.4–10.8)

## 2018-12-12 LAB — LIPID PANEL
Chol/HDL Ratio: 2.4 ratio (ref 0.0–5.0)
Cholesterol, Total: 83 mg/dL — ABNORMAL LOW (ref 100–199)
HDL: 34 mg/dL — ABNORMAL LOW (ref >39)
LDL Chol Calc (NIH): 22 mg/dL (ref 0–99)
Triglycerides: 159 mg/dL — ABNORMAL HIGH (ref 0–149)
VLDL Cholesterol Cal: 27 mg/dL (ref 5–40)

## 2018-12-12 LAB — TSH: TSH: 2.24 u[IU]/mL (ref 0.450–4.500)

## 2018-12-16 ENCOUNTER — Other Ambulatory Visit: Payer: Self-pay

## 2018-12-16 ENCOUNTER — Ambulatory Visit (HOSPITAL_COMMUNITY)
Admission: RE | Admit: 2018-12-16 | Discharge: 2018-12-16 | Disposition: A | Discharge status: Home / Self Care | Payer: Medicare Other | Source: Ambulatory Visit | Attending: Cardiology | Admitting: Cardiology

## 2018-12-16 DIAGNOSIS — I739 Peripheral vascular disease, unspecified: Secondary | ICD-10-CM | POA: Insufficient documentation

## 2018-12-27 ENCOUNTER — Other Ambulatory Visit: Payer: Self-pay

## 2018-12-27 ENCOUNTER — Ambulatory Visit (INDEPENDENT_AMBULATORY_CARE_PROVIDER_SITE_OTHER): Payer: BC Managed Care – PPO | Admitting: Cardiovascular Disease

## 2018-12-27 ENCOUNTER — Encounter: Payer: Self-pay | Admitting: Cardiovascular Disease

## 2018-12-27 VITALS — BP 120/54 | HR 62 | Temp 97.5°F | Ht 68.0 in | Wt 145.0 lb

## 2018-12-27 DIAGNOSIS — I739 Peripheral vascular disease, unspecified: Secondary | ICD-10-CM | POA: Diagnosis not present

## 2018-12-27 DIAGNOSIS — Z01818 Encounter for other preprocedural examination: Secondary | ICD-10-CM

## 2018-12-27 DIAGNOSIS — Z01812 Encounter for preprocedural laboratory examination: Secondary | ICD-10-CM | POA: Diagnosis not present

## 2018-12-27 DIAGNOSIS — Z008 Encounter for other general examination: Secondary | ICD-10-CM

## 2018-12-27 NOTE — Progress Notes (Signed)
12/27/2018 [place]   [birth year]  [id number]  Primary Physician Patient, No Pcp Per Primary Cardiologist: Lorretta Harp MD Lupe Carney, [place]  HPI:  Ralph Dawson is a 78 y.o. thin appearing married Caucasian male father of 30, grandfather of 2 grandchildren referred by Dr. Claiborne Billings for peripheral vascular valuation because of lifestyle limiting claudication.  He is retired from being a Programmer, systems and doing Sales executive history factors include over 60 pack years of tobacco abuse continue to smoke 1 pack/day, treated hypertension hyperlipidemia.  He did have a myocardial infarction back in January of last year and had a cardiac catheterization, PCI and drug-eluting stent of the AV groove circumflex by Dr. Irish Lack.  He currently denies chest pain.  The claudication of his left calf for several years with recent Dopplers performed 12/20/2018 revealing a left ABI 0.52 with high frequency signals in his left common femoral and distal left SFA.   No outpatient medications have been marked as taking for the 12/27/18 encounter (Office Visit) with Lorretta Harp, MD.     No Known Allergies  Social History   Socioeconomic History  . Marital status: Married    Spouse name: Not on file  . Number of children: Not on file  . Years of education: Not on file  . Highest education level: Not on file  Occupational History  . Not on file  Social Needs  . Financial resource strain: Not on file  . Food insecurity    Worry: Not on file    Inability: Not on file  . Transportation needs    Medical: Not on file    Non-medical: Not on file  Tobacco Use  . Smoking status: Current Some Day Smoker    Packs/day: 0.25    Years: 65.00    Pack years: 16.25    Types: Cigarettes    Last attempt to quit: 06/14/2017    Years since quitting: 1.5  . Smokeless tobacco: Never Used  . Tobacco comment: smoking a 10 cigarettes a week   Substance and Sexual Activity  . Alcohol  use: No  . Drug use: No  . Sexual activity: Yes  Lifestyle  . Physical activity    Days per week: Not on file    Minutes per session: Not on file  . Stress: Not on file  Relationships  . Social Herbalist on phone: Not on file    Gets together: Not on file    Attends religious service: Not on file    Active member of club or organization: Not on file    Attends meetings of clubs or organizations: Not on file    Relationship status: Not on file  . Intimate partner violence    Fear of current or ex partner: Not on file    Emotionally abused: Not on file    Physically abused: Not on file    Forced sexual activity: Not on file  Other Topics Concern  . Not on file  Social History Narrative  . Not on file     Review of Systems: General: negative for chills, fever, night sweats or weight changes.  Cardiovascular: negative for chest pain, dyspnea on exertion, edema, orthopnea, palpitations, paroxysmal nocturnal dyspnea or shortness of breath Dermatological: negative for rash Respiratory: negative for cough or wheezing Urologic: negative for hematuria Abdominal: negative for nausea, vomiting, diarrhea, bright red blood per rectum, melena, or hematemesis Neurologic: negative for visual changes,  syncope, or dizziness All other systems reviewed and are otherwise negative except as noted above.    Blood pressure (!) 120/54, pulse 62, temperature (!) 97.5 F (36.4 C), height 5\' 8"  (1.727 m), weight 145 lb (65.8 kg).  General appearance: alert and no distress Neck: no adenopathy, no carotid bruit, no JVD, supple, symmetrical, trachea midline and thyroid not enlarged, symmetric, no tenderness/mass/nodules Lungs: clear to auscultation bilaterally Heart: regular rate and rhythm, S1, S2 normal, no murmur, click, rub or gallop Extremities: extremities normal, atraumatic, no cyanosis or edema Pulses: 2+ and symmetric Skin: Skin color, texture, turgor normal. No rashes or lesions  Neurologic: Alert and oriented X 3, normal strength and tone. Normal symmetric reflexes. Normal coordination and gait  EKG sinus rhythm at 62 with right bundle branch block.  I personally reviewed this EKG.  ASSESSMENT AND PLAN:   Peripheral arterial disease [facility]) Mr. Fallen was referred by Dr. Tresa EndoKelly for evaluation treatment of symptomatic PAD.  He has a history of CAD and multiple other cardiac risk factors.  He is a claudication in his left calf for several years with recent Doppler study performed 12/16/2018 revealing a right ABI of 1.02 and a left ABI 0.52.  He has a high-frequency signal in the left distal common femoral artery and distal SFA with dampened monophasic waveforms beyond that.  His symptoms are lifestyle limiting.  He is out in the garden walking all day wishes to have this addressed via an endovascular approach.      Runell GessJonathan J. Chelsea Nusz MD FACP,FACC,FAHA, [facility] 12/27/2018 1:36 PM

## 2018-12-27 NOTE — Assessment & Plan Note (Signed)
Mr. Nooney was referred by Dr. Claiborne Billings for evaluation treatment of symptomatic PAD.  He has a history of CAD and multiple other cardiac risk factors.  He is a claudication in his left calf for several years with recent Doppler study performed 12/16/2018 revealing a right ABI of 1.02 and a left ABI 0.52.  He has a high-frequency signal in the left distal common femoral artery and distal SFA with dampened monophasic waveforms beyond that.  His symptoms are lifestyle limiting.  He is out in the garden walking all day wishes to have this addressed via an endovascular approach.

## 2018-12-27 NOTE — H&P (View-Only) (Signed)
12/27/2018 [place]   [birth year]  [id number]  Primary Physician Patient, No Pcp Per Primary Cardiologist: Lorretta Harp MD Lupe Carney, [place]  HPI:  Ralph Dawson is a 78 y.o. thin appearing married Caucasian male father of 30, grandfather of 2 grandchildren referred by Dr. Claiborne Billings for peripheral vascular valuation because of lifestyle limiting claudication.  He is retired from being a Programmer, systems and doing Sales executive history factors include over 60 pack years of tobacco abuse continue to smoke 1 pack/day, treated hypertension hyperlipidemia.  He did have a myocardial infarction back in January of last year and had a cardiac catheterization, PCI and drug-eluting stent of the AV groove circumflex by Dr. Irish Lack.  He currently denies chest pain.  The claudication of his left calf for several years with recent Dopplers performed 12/20/2018 revealing a left ABI 0.52 with high frequency signals in his left common femoral and distal left SFA.   No outpatient medications have been marked as taking for the 12/27/18 encounter (Office Visit) with Lorretta Harp, MD.     No Known Allergies  Social History   Socioeconomic History  . Marital status: Married    Spouse name: Not on file  . Number of children: Not on file  . Years of education: Not on file  . Highest education level: Not on file  Occupational History  . Not on file  Social Needs  . Financial resource strain: Not on file  . Food insecurity    Worry: Not on file    Inability: Not on file  . Transportation needs    Medical: Not on file    Non-medical: Not on file  Tobacco Use  . Smoking status: Current Some Day Smoker    Packs/day: 0.25    Years: 65.00    Pack years: 16.25    Types: Cigarettes    Last attempt to quit: 06/14/2017    Years since quitting: 1.5  . Smokeless tobacco: Never Used  . Tobacco comment: smoking a 10 cigarettes a week   Substance and Sexual Activity  . Alcohol  use: No  . Drug use: No  . Sexual activity: Yes  Lifestyle  . Physical activity    Days per week: Not on file    Minutes per session: Not on file  . Stress: Not on file  Relationships  . Social Herbalist on phone: Not on file    Gets together: Not on file    Attends religious service: Not on file    Active member of club or organization: Not on file    Attends meetings of clubs or organizations: Not on file    Relationship status: Not on file  . Intimate partner violence    Fear of current or ex partner: Not on file    Emotionally abused: Not on file    Physically abused: Not on file    Forced sexual activity: Not on file  Other Topics Concern  . Not on file  Social History Narrative  . Not on file     Review of Systems: General: negative for chills, fever, night sweats or weight changes.  Cardiovascular: negative for chest pain, dyspnea on exertion, edema, orthopnea, palpitations, paroxysmal nocturnal dyspnea or shortness of breath Dermatological: negative for rash Respiratory: negative for cough or wheezing Urologic: negative for hematuria Abdominal: negative for nausea, vomiting, diarrhea, bright red blood per rectum, melena, or hematemesis Neurologic: negative for visual changes,  syncope, or dizziness All other systems reviewed and are otherwise negative except as noted above.    Blood pressure (!) 120/54, pulse 62, temperature (!) 97.5 F (36.4 C), height 5' 8" (1.727 m), weight 145 lb (65.8 kg).  General appearance: alert and no distress Neck: no adenopathy, no carotid bruit, no JVD, supple, symmetrical, trachea midline and thyroid not enlarged, symmetric, no tenderness/mass/nodules Lungs: clear to auscultation bilaterally Heart: regular rate and rhythm, S1, S2 normal, no murmur, click, rub or gallop Extremities: extremities normal, atraumatic, no cyanosis or edema Pulses: 2+ and symmetric Skin: Skin color, texture, turgor normal. No rashes or lesions  Neurologic: Alert and oriented X 3, normal strength and tone. Normal symmetric reflexes. Normal coordination and gait  EKG sinus rhythm at 62 with right bundle branch block.  I personally reviewed this EKG.  ASSESSMENT AND PLAN:   Peripheral arterial disease (HCC) Mr. Toner was referred by Dr. Kelly for evaluation treatment of symptomatic PAD.  He has a history of CAD and multiple other cardiac risk factors.  He is a claudication in his left calf for several years with recent Doppler study performed 12/16/2018 revealing a right ABI of 1.02 and a left ABI 0.52.  He has a high-frequency signal in the left distal common femoral artery and distal SFA with dampened monophasic waveforms beyond that.  His symptoms are lifestyle limiting.  He is out in the garden walking all day wishes to have this addressed via an endovascular approach.      Wiatt Mahabir J. Garhett Bernhard MD FACP,FACC,FAHA, FSCAI 12/27/2018 1:36 PM 

## 2018-12-27 NOTE — Patient Instructions (Signed)
[name] [address] [place] [address] Dept: [phone] Loc: [phone]  Ralph GoadGeorge E Dawson  12/27/2018  You are scheduled for a Peripheral Angiogram on Thursday, October 1 with Dr. Nanetta BattyJonathan Dawson.  1. [facility]: [address] [place], [place] [address] at 5:30 AM (This time is two hours before your procedure to ensure your preparation). Free valet parking service is available.   Special note: Every effort is made to have your procedure done on time. Please understand that emergencies sometimes delay scheduled procedures.  2. Diet: Do not eat solid foods after midnight.  The patient may have clear liquids until 5am upon the day of the procedure.  3. Labs:  You will need to have blood drawn today. Go to:  [facility] [address], [place], [place] [address] FOR YOUR BASIC METABOLIC PANEL, COMPLETE BLOOD COUNT, AND THYROID STIMULATING HORMONE LAB WORK. NO APPOINTMENT IS NEEDED. YOU MUST HAVE THIS LAB WORK DONE BEFORE GOING TO GET YOUR COVID-19 TEST DONE BECAUSE YOU WILL NEED TO SELF-ISOLATE AFTER THE COVID-19 TEST UNTIL THE DAY OF YOUR PROCEDURE/TEST.  You will need a COVID-19 test on 01/09/2019. Your appointment is at 10:30am. Go to: [facility] En[facility] [address] [place], [place] [address] FOR YOUR COVID-19 TEST. YOU MUST HAVE YOUR COVID-19 TEST COMPLETED 4 DAYS PRIOR TO YOUR UPCOMING PROCEDURE/TEST. YOU WILL ALSO NEED TO SELF-ISOLATE AFTER THE COVID-19 TEST UNTIL THE DAY OF YOUR PROCEDURE/TEST. PLEASE BRING YOUR I.D. AND YOUR INSURANCE CARD(S) WITH YOU.    4. Medication instructions in preparation for your procedure:  On the morning of your procedure, take your Brilinta/Ticagrelor and any morning medicines NOT listed above.  You may use sips of water.  5. Plan for  one night stay--bring personal belongings. 6. Bring a current list of your medications and current insurance cards. 7. You MUST have a responsible person to drive you home. 8. Someone MUST be with you the first 24 hours after you arrive home or your discharge will be delayed. 9. Please wear clothes that are easy to get on and off and wear slip-on shoes.  ______________________________________________________________ Testing/Procedures: Your physician has requested that you have an aorta/iliac duplex. During this test, an ultrasound is used to evaluate blood flow to the aorta and iliac arteries. Allow one hour for this exam. Do not eat after midnight the day before and avoid carbonated beverages. TO BE SCHEDULED FOR THE NEXT AVAILABLE APPOINTMENT THAT IS AFTER YOUR PROCEDURE  Your physician has requested that you have an ankle brachial index (ABI). During this test an ultrasound and blood pressure cuff are used to evaluate the arteries that supply the arms and legs with blood. Allow thirty minutes for this exam. There are no restrictions or special instructions. TO BE SCHEDULED FOR THE NEXT AVAILABLE APPOINTMENT THAT IS AFTER YOUR PROCEDURE   Follow-Up: At [facility], you and your health needs are our priority.  As part of our continuing mission to provide you with exceptional heart care, we have created designated Provider Care Teams.  These Care Teams include your primary Cardiologist (physician) and Advanced Practice Providers (APPs -  Physician Assistants and Nurse Practitioners) who all work together to provide you with the care you need, when you need it. You will need a follow up appointment with Dr. Nanetta BattyJonathan Dawson after your ultrasounds.  TO  BE SCHEDULED FOR THE NEXT AVAILABLE APPOINTMENT THAT IS AFTER YOUR ULTRASOUNDS.

## 2018-12-28 LAB — BASIC METABOLIC PANEL
BUN/Creatinine Ratio: 9 — ABNORMAL LOW (ref 10–24)
BUN: 12 mg/dL (ref 8–27)
CO2: 24 mmol/L (ref 20–29)
Calcium: 9 mg/dL (ref 8.6–10.2)
Chloride: 104 mmol/L (ref 96–106)
Creatinine, Ser: 1.28 mg/dL — ABNORMAL HIGH (ref 0.76–1.27)
GFR calc Af Amer: 62 mL/min/{1.73_m2} (ref >59)
GFR calc non Af Amer: 53 mL/min/{1.73_m2} — ABNORMAL LOW (ref >59)
Glucose: 108 mg/dL — ABNORMAL HIGH (ref 65–99)
Potassium: 4.5 mmol/L (ref 3.5–5.2)
Sodium: 140 mmol/L (ref 134–144)

## 2018-12-28 LAB — CBC
Hematocrit: 39.6 % (ref 37.5–51.0)
Hemoglobin: 14 g/dL (ref 13.0–17.7)
MCH: 32.3 pg (ref 26.6–33.0)
MCHC: 35.4 g/dL (ref 31.5–35.7)
MCV: 91 fL (ref 79–97)
Platelets: 223 10*3/uL (ref 150–450)
RBC: 4.34 x10E6/uL (ref 4.14–5.80)
RDW: 11.9 % (ref 11.6–15.4)
WBC: 8.6 10*3/uL (ref 3.4–10.8)

## 2018-12-28 LAB — TSH: TSH: 2.03 u[IU]/mL (ref 0.450–4.500)

## 2019-01-03 ENCOUNTER — Telehealth: Payer: Self-pay | Admitting: Cardiovascular Disease

## 2019-01-03 NOTE — Telephone Encounter (Signed)
New Message    Patient wants to know if he can have Covid test done at [facility] instead of coming to [facility].

## 2019-01-03 NOTE — Telephone Encounter (Signed)
Spoke with patient and advised he is having additional testing prior to procedure and will need to be NPO, verbalized understanding.

## 2019-01-03 NOTE — Telephone Encounter (Signed)
Called and LVM advising patient that having the test done at [facility] would be better- as we have it under one system, it makes it easier for the testing to be completed, as well as the wait time for the results are a little longer due to them using a different manufacturer than [facility] uses for their labs to be sent out.  Left call back number if questions.

## 2019-01-03 NOTE — Telephone Encounter (Signed)
New Message     Patient has questions about the instructions before having Aorta vasc study done.  States he never had to follow the instructions before and he wants to make sure he has to follow the instructions.

## 2019-01-09 ENCOUNTER — Other Ambulatory Visit (HOSPITAL_COMMUNITY)
Admission: RE | Admit: 2019-01-09 | Discharge: 2019-01-09 | Disposition: A | Discharge status: Home / Self Care | Payer: Medicare Other | Source: Ambulatory Visit | Attending: Cardiovascular Disease | Admitting: Cardiovascular Disease

## 2019-01-09 DIAGNOSIS — Z20828 Contact with and (suspected) exposure to other viral communicable diseases: Secondary | ICD-10-CM | POA: Insufficient documentation

## 2019-01-09 DIAGNOSIS — Z01812 Encounter for preprocedural laboratory examination: Secondary | ICD-10-CM | POA: Insufficient documentation

## 2019-01-10 ENCOUNTER — Telehealth: Payer: Self-pay

## 2019-01-10 ENCOUNTER — Telehealth: Payer: Self-pay | Admitting: *Deleted

## 2019-01-10 ENCOUNTER — Other Ambulatory Visit: Payer: Self-pay

## 2019-01-10 DIAGNOSIS — I739 Peripheral vascular disease, unspecified: Secondary | ICD-10-CM

## 2019-01-10 LAB — NOVEL CORONAVIRUS, NAA (HOSP ORDER, SEND-OUT TO REF LAB; TAT 18-24 HRS): SARS-CoV-2, NAA: NOT DETECTED

## 2019-01-10 NOTE — Telephone Encounter (Addendum)
Pt contacted pre-catheterization scheduled at [facility] for: Thursday January 12, 2019 7:30 AM Verified arrival time and place: [facility]) at: 5:30 AM   No solid food after midnight prior to cath, clear liquids until 5 AM day of procedure. Contrast allergy: no  Hold: Lisinopril-PM prior to procedure-GFR 53  AM meds can be  taken pre-cath with sip of water including: ASA 81 mg Brilinta 90 mg  Confirmed patient has responsible person to drive home post procedure and observe 24 hours after arriving home: yes  Currently, due to Covid-19 pandemic, only one support person will be allowed with patient. Must be the same support person for that patient's entire stay, will be screened and required to wear a mask. They will be asked to wait in the waiting room for the duration of the patient's stay.  Patients are required to wear a mask when they enter the hospital.      COVID-19 Pre-Screening Questions:  . In the past 7 to 10 days have you had a cough,  shortness of breath, headache, congestion, fever (100 or greater) body aches, chills, sore throat, or sudden loss of taste or sense of smell? no . Have you been around anyone with known Covid 19? no . Have you been around anyone who is awaiting Covid 19 test results in the past 7 to 10 days? no . Have you been around anyone who has been exposed to Covid 19, or has mentioned symptoms of Covid 19 within the past 7 to 10 days?  I reviewed procedure/mask/visitor instructions, Covid-19 screening questions with patient, he verbalized understanding, thanked me for call.

## 2019-01-10 NOTE — Telephone Encounter (Signed)
New Message ° ° °Patient returning your call. °

## 2019-01-10 NOTE — Telephone Encounter (Signed)
Error

## 2019-01-12 ENCOUNTER — Encounter (HOSPITAL_COMMUNITY)
Admission: RE | Disposition: A | Discharge status: Home / Self Care | Payer: BC Managed Care – PPO | Source: Home / Self Care | Attending: Cardiovascular Disease

## 2019-01-12 ENCOUNTER — Other Ambulatory Visit: Payer: Self-pay

## 2019-01-12 ENCOUNTER — Ambulatory Visit (HOSPITAL_COMMUNITY)
Admission: RE | Admit: 2019-01-12 | Discharge: 2019-01-13 | Disposition: A | Discharge status: Home / Self Care | Payer: BC Managed Care – PPO | Attending: Cardiovascular Disease | Admitting: Cardiovascular Disease

## 2019-01-12 DIAGNOSIS — I129 Hypertensive chronic kidney disease with stage 1 through stage 4 chronic kidney disease, or unspecified chronic kidney disease: Secondary | ICD-10-CM | POA: Diagnosis not present

## 2019-01-12 DIAGNOSIS — N183 Chronic kidney disease, stage 3 unspecified: Secondary | ICD-10-CM | POA: Diagnosis not present

## 2019-01-12 DIAGNOSIS — Z91041 Radiographic dye allergy status: Secondary | ICD-10-CM | POA: Diagnosis not present

## 2019-01-12 DIAGNOSIS — I70212 Atherosclerosis of native arteries of extremities with intermittent claudication, left leg: Secondary | ICD-10-CM | POA: Diagnosis not present

## 2019-01-12 DIAGNOSIS — I4892 Unspecified atrial flutter: Secondary | ICD-10-CM | POA: Insufficient documentation

## 2019-01-12 DIAGNOSIS — T508X5A Adverse effect of diagnostic agents, initial encounter: Secondary | ICD-10-CM

## 2019-01-12 DIAGNOSIS — E785 Hyperlipidemia, unspecified: Secondary | ICD-10-CM | POA: Insufficient documentation

## 2019-01-12 DIAGNOSIS — I252 Old myocardial infarction: Secondary | ICD-10-CM | POA: Diagnosis not present

## 2019-01-12 DIAGNOSIS — I4891 Unspecified atrial fibrillation: Secondary | ICD-10-CM | POA: Diagnosis not present

## 2019-01-12 DIAGNOSIS — D72829 Elevated white blood cell count, unspecified: Secondary | ICD-10-CM | POA: Diagnosis not present

## 2019-01-12 DIAGNOSIS — I739 Peripheral vascular disease, unspecified: Secondary | ICD-10-CM | POA: Diagnosis present

## 2019-01-12 DIAGNOSIS — I10 Essential (primary) hypertension: Secondary | ICD-10-CM | POA: Diagnosis present

## 2019-01-12 DIAGNOSIS — F17210 Nicotine dependence, cigarettes, uncomplicated: Secondary | ICD-10-CM | POA: Diagnosis not present

## 2019-01-12 HISTORY — DX: Radiographic dye allergy status: Z91.041

## 2019-01-12 HISTORY — DX: Unspecified atrial fibrillation: I48.92

## 2019-01-12 HISTORY — DX: Unspecified atrial fibrillation: I48.91

## 2019-01-12 HISTORY — DX: Atherosclerotic heart disease of native coronary artery without angina pectoris: I25.10

## 2019-01-12 HISTORY — DX: Chronic kidney disease, stage 3 unspecified: N18.30

## 2019-01-12 HISTORY — DX: Reserved for concepts with insufficient information to code with codable children: IMO0002

## 2019-01-12 HISTORY — DX: Tobacco use: Z72.0

## 2019-01-12 HISTORY — DX: Peripheral vascular disease, unspecified: I73.9

## 2019-01-12 HISTORY — PX: ABDOMINAL AORTOGRAM W/LOWER EXTREMITY: CATH118223

## 2019-01-12 HISTORY — DX: Bradycardia, unspecified: R00.1

## 2019-01-12 HISTORY — PX: PERIPHERAL VASCULAR ATHERECTOMY: CATH118256

## 2019-01-12 LAB — BASIC METABOLIC PANEL
Anion gap: 11 (ref 5–15)
BUN: 14 mg/dL (ref 8–23)
CO2: 20 mmol/L — ABNORMAL LOW (ref 22–32)
Calcium: 8.5 mg/dL — ABNORMAL LOW (ref 8.9–10.3)
Chloride: 110 mmol/L (ref 98–111)
Creatinine, Ser: 1.44 mg/dL — ABNORMAL HIGH (ref 0.61–1.24)
GFR calc Af Amer: 54 mL/min — ABNORMAL LOW (ref >60)
GFR calc non Af Amer: 46 mL/min — ABNORMAL LOW (ref >60)
Glucose, Bld: 249 mg/dL — ABNORMAL HIGH (ref 70–99)
Potassium: 3.5 mmol/L (ref 3.5–5.1)
Sodium: 141 mmol/L (ref 135–145)

## 2019-01-12 LAB — MAGNESIUM: Magnesium: 1.9 mg/dL (ref 1.7–2.4)

## 2019-01-12 LAB — POCT ACTIVATED CLOTTING TIME
Activated Clotting Time: 367 seconds
Activated Clotting Time: 252 seconds
Activated Clotting Time: 180 seconds
Activated Clotting Time: 197 seconds

## 2019-01-12 SURGERY — ABDOMINAL AORTOGRAM W/LOWER EXTREMITY
Anesthesia: LOCAL | Laterality: Left

## 2019-01-12 MED ORDER — DIPHENHYDRAMINE HCL 50 MG/ML IJ SOLN
25.0000 mg | Freq: Once | INTRAMUSCULAR | Status: AC
  Administered 2019-01-12: 25 mg via INTRAVENOUS

## 2019-01-12 MED ORDER — ASPIRIN EC 81 MG PO TBEC
81.0000 mg | DELAYED_RELEASE_TABLET | Freq: Every day | ORAL | Status: DC
  Administered 2019-01-13: 81 mg via ORAL
  Filled 2019-01-12: qty 1

## 2019-01-12 MED ORDER — MORPHINE SULFATE (PF) 2 MG/ML IV SOLN: 2.0000 mg | INTRAVENOUS | Status: DC | PRN

## 2019-01-12 MED ORDER — METOPROLOL SUCCINATE ER 25 MG PO TB24: 25.0000 mg | ORAL_TABLET | Freq: Every day | ORAL | Status: DC

## 2019-01-12 MED ORDER — ONDANSETRON HCL 4 MG/2ML IJ SOLN: 4.0000 mg | Freq: Four times a day (QID) | INTRAMUSCULAR | Status: DC | PRN

## 2019-01-12 MED ORDER — DULOXETINE HCL 60 MG PO CPEP
60.0000 mg | ORAL_CAPSULE | Freq: Every day | ORAL | Status: DC
  Administered 2019-01-12: 22:00:00 60 mg via ORAL
  Filled 2019-01-12: qty 1

## 2019-01-12 MED ORDER — SODIUM CHLORIDE 0.9 % WEIGHT BASED INFUSION: 1.0000 mL/kg/h | INTRAVENOUS | Status: DC

## 2019-01-12 MED ORDER — SODIUM CHLORIDE 0.9% FLUSH: 3.0000 mL | INTRAVENOUS | Status: DC | PRN

## 2019-01-12 MED ORDER — HEPARIN SODIUM (PORCINE) 1000 UNIT/ML IJ SOLN
INTRAMUSCULAR | Status: DC | PRN
  Administered 2019-01-12: 7000 [IU] via INTRAVENOUS

## 2019-01-12 MED ORDER — METHYLPREDNISOLONE SODIUM SUCC 125 MG IJ SOLR
INTRAMUSCULAR | Status: AC
  Filled 2019-01-12: qty 2

## 2019-01-12 MED ORDER — MIDAZOLAM HCL 2 MG/2ML IJ SOLN
INTRAMUSCULAR | Status: AC
  Filled 2019-01-12: qty 2

## 2019-01-12 MED ORDER — HEPARIN (PORCINE) IN NACL 1000-0.9 UT/500ML-% IV SOLN
INTRAVENOUS | Status: AC
  Filled 2019-01-12: qty 1000

## 2019-01-12 MED ORDER — LABETALOL HCL 5 MG/ML IV SOLN
10.0000 mg | INTRAVENOUS | Status: DC | PRN
  Administered 2019-01-12: 10 mg via INTRAVENOUS
  Filled 2019-01-12: qty 4

## 2019-01-12 MED ORDER — SODIUM CHLORIDE 0.9 % IV SOLN
INTRAVENOUS | Status: AC
  Administered 2019-01-12: 18:00:00 via INTRAVENOUS

## 2019-01-12 MED ORDER — SODIUM CHLORIDE 0.9 % WEIGHT BASED INFUSION
3.0000 mL/kg/h | INTRAVENOUS | Status: DC
  Administered 2019-01-12: 3 mL/kg/h via INTRAVENOUS

## 2019-01-12 MED ORDER — LISINOPRIL 5 MG PO TABS
5.0000 mg | ORAL_TABLET | Freq: Every day | ORAL | Status: DC
  Administered 2019-01-12: 5 mg via ORAL
  Filled 2019-01-12: qty 1

## 2019-01-12 MED ORDER — LIDOCAINE HCL (PF) 1 % IJ SOLN
INTRAMUSCULAR | Status: DC | PRN
  Administered 2019-01-12: 28 mL

## 2019-01-12 MED ORDER — ACETAMINOPHEN 325 MG PO TABS: 650.0000 mg | ORAL_TABLET | ORAL | Status: DC | PRN

## 2019-01-12 MED ORDER — METOPROLOL SUCCINATE ER 25 MG PO TB24
25.0000 mg | ORAL_TABLET | Freq: Every day | ORAL | Status: DC
  Administered 2019-01-12: 25 mg via ORAL
  Filled 2019-01-12: qty 1

## 2019-01-12 MED ORDER — ACETAMINOPHEN 500 MG PO TABS: 1000.0000 mg | ORAL_TABLET | Freq: Four times a day (QID) | ORAL | Status: DC | PRN

## 2019-01-12 MED ORDER — ISOSORBIDE DINITRATE 10 MG PO TABS
30.0000 mg | ORAL_TABLET | Freq: Every day | ORAL | Status: DC
  Administered 2019-01-12: 30 mg via ORAL
  Filled 2019-01-12: qty 3

## 2019-01-12 MED ORDER — IODIXANOL 320 MG/ML IV SOLN
INTRAVENOUS | Status: DC | PRN
  Administered 2019-01-12: 09:00:00 150 mL via INTRA_ARTERIAL

## 2019-01-12 MED ORDER — TICAGRELOR 90 MG PO TABS
90.0000 mg | ORAL_TABLET | Freq: Two times a day (BID) | ORAL | Status: DC
  Administered 2019-01-12 – 2019-01-13 (×2): 90 mg via ORAL
  Filled 2019-01-12 (×2): qty 1

## 2019-01-12 MED ORDER — NICOTINE 14 MG/24HR TD PT24
14.0000 mg | MEDICATED_PATCH | Freq: Every day | TRANSDERMAL | Status: DC
  Administered 2019-01-12 – 2019-01-13 (×2): 14 mg via TRANSDERMAL
  Filled 2019-01-12 (×2): qty 1

## 2019-01-12 MED ORDER — NITROGLYCERIN 0.4 MG SL SUBL: 0.4000 mg | SUBLINGUAL_TABLET | SUBLINGUAL | Status: DC | PRN

## 2019-01-12 MED ORDER — SODIUM CHLORIDE 0.9 % IV SOLN: 250.0000 mL | INTRAVENOUS | Status: DC | PRN

## 2019-01-12 MED ORDER — ATORVASTATIN CALCIUM 80 MG PO TABS
80.0000 mg | ORAL_TABLET | Freq: Every day | ORAL | Status: DC
  Administered 2019-01-12: 80 mg via ORAL
  Filled 2019-01-12: qty 1

## 2019-01-12 MED ORDER — TAMSULOSIN HCL 0.4 MG PO CAPS
0.4000 mg | ORAL_CAPSULE | Freq: Every day | ORAL | Status: DC
  Administered 2019-01-12: 0.4 mg via ORAL
  Filled 2019-01-12: qty 1

## 2019-01-12 MED ORDER — FENTANYL CITRATE (PF) 100 MCG/2ML IJ SOLN
INTRAMUSCULAR | Status: AC
  Filled 2019-01-12: qty 2

## 2019-01-12 MED ORDER — DONEPEZIL HCL 5 MG PO TABS
5.0000 mg | ORAL_TABLET | Freq: Every day | ORAL | Status: DC
  Administered 2019-01-12: 5 mg via ORAL
  Filled 2019-01-12: qty 1

## 2019-01-12 MED ORDER — DIPHENHYDRAMINE HCL 50 MG/ML IJ SOLN
INTRAMUSCULAR | Status: AC
  Filled 2019-01-12: qty 1

## 2019-01-12 MED ORDER — HYDRALAZINE HCL 20 MG/ML IJ SOLN
INTRAMUSCULAR | Status: AC
  Filled 2019-01-12: qty 1

## 2019-01-12 MED ORDER — MIDAZOLAM HCL 2 MG/2ML IJ SOLN
INTRAMUSCULAR | Status: DC | PRN
  Administered 2019-01-12: 1 mg via INTRAVENOUS

## 2019-01-12 MED ORDER — LIDOCAINE HCL (PF) 1 % IJ SOLN
INTRAMUSCULAR | Status: AC
  Filled 2019-01-12: qty 30

## 2019-01-12 MED ORDER — FENTANYL CITRATE (PF) 100 MCG/2ML IJ SOLN
INTRAMUSCULAR | Status: DC | PRN
  Administered 2019-01-12: 25 ug via INTRAVENOUS

## 2019-01-12 MED ORDER — ASPIRIN EC 81 MG PO TBEC: 81.0000 mg | DELAYED_RELEASE_TABLET | Freq: Every day | ORAL | Status: DC

## 2019-01-12 MED ORDER — METHYLPREDNISOLONE SODIUM SUCC 125 MG IJ SOLR
60.0000 mg | Freq: Once | INTRAMUSCULAR | Status: AC
  Administered 2019-01-12: 60 mg via INTRAVENOUS

## 2019-01-12 MED ORDER — HEPARIN (PORCINE) IN NACL 1000-0.9 UT/500ML-% IV SOLN
INTRAVENOUS | Status: DC | PRN
  Administered 2019-01-12 (×2): 500 mL

## 2019-01-12 MED ORDER — SODIUM CHLORIDE 0.9% FLUSH: 3.0000 mL | Freq: Two times a day (BID) | INTRAVENOUS | Status: DC

## 2019-01-12 MED ORDER — HYDRALAZINE HCL 20 MG/ML IJ SOLN
5.0000 mg | INTRAMUSCULAR | Status: DC | PRN
  Administered 2019-01-12: 5 mg via INTRAVENOUS

## 2019-01-12 MED ORDER — ASPIRIN 81 MG PO CHEW: 81.0000 mg | CHEWABLE_TABLET | ORAL | Status: DC

## 2019-01-12 MED ORDER — POTASSIUM CHLORIDE CRYS ER 20 MEQ PO TBCR
40.0000 meq | EXTENDED_RELEASE_TABLET | Freq: Once | ORAL | Status: AC
  Administered 2019-01-12: 40 meq via ORAL
  Filled 2019-01-12: qty 2

## 2019-01-12 MED ORDER — ALBUTEROL SULFATE (2.5 MG/3ML) 0.083% IN NEBU: 2.5000 mg | INHALATION_SOLUTION | Freq: Four times a day (QID) | RESPIRATORY_TRACT | Status: DC | PRN

## 2019-01-12 MED ORDER — SODIUM CHLORIDE 0.9% FLUSH
3.0000 mL | Freq: Two times a day (BID) | INTRAVENOUS | Status: DC
  Administered 2019-01-12: 3 mL via INTRAVENOUS

## 2019-01-12 MED ORDER — IPRATROPIUM BROMIDE 0.06 % NA SOLN: 1.0000 | Freq: Three times a day (TID) | NASAL | Status: DC | PRN

## 2019-01-12 SURGICAL SUPPLY — 26 items
BALLN COYOTE ES OTW 2.5X40X145 (BALLOONS) ×3
BALLN IN.PACT DCB 5X120 (BALLOONS) ×3
BALLOON CYTE ES OTW 2.5X40X145 (BALLOONS) ×2 IMPLANT
CATH ANGIO 5F PIGTAIL 65CM (CATHETERS) ×3 IMPLANT
CATH CROSS OVER TEMPO 5F (CATHETERS) ×3 IMPLANT
CATH HAWKONE LX EXTENDED TIP (CATHETERS) ×3 IMPLANT
CATH STRAIGHT 5FR 65CM (CATHETERS) ×3 IMPLANT
DCB IN.PACT 5X120 (BALLOONS) ×2 IMPLANT
DEVICE CONTINUOUS FLUSH (MISCELLANEOUS) ×3 IMPLANT
DEVICE SPIDERFX EMB PROT 6MM (WIRE) ×3 IMPLANT
KIT ENCORE 26 ADVANTAGE (KITS) ×3 IMPLANT
KIT PV (KITS) ×3 IMPLANT
SHEATH HIGHFLEX ANSEL 7FR 55CM (SHEATH) ×3 IMPLANT
SHEATH PINNACLE 5F 10CM (SHEATH) ×3 IMPLANT
SHEATH PINNACLE 7F 10CM (SHEATH) ×3 IMPLANT
SHEATH PROBE COVER 6X72 (BAG) ×3 IMPLANT
STOPCOCK MORSE 400PSI 3WAY (MISCELLANEOUS) ×3 IMPLANT
SYR MEDRAD MARK 7 150ML (SYRINGE) ×3 IMPLANT
TAPE VIPERTRACK RADIOPAQ (MISCELLANEOUS) ×2 IMPLANT
TAPE VIPERTRACK RADIOPAQUE (MISCELLANEOUS) ×1
TRANSDUCER W/STOPCOCK (MISCELLANEOUS) ×3 IMPLANT
TRAY PV CATH (CUSTOM PROCEDURE TRAY) ×3 IMPLANT
TUBING CIL FLEX 10 FLL-RA (TUBING) ×3 IMPLANT
WIRE HITORQ VERSACORE ST 145CM (WIRE) ×3 IMPLANT
WIRE ROSEN-J .035X180CM (WIRE) ×3 IMPLANT
WIRE SPARTACORE .014X300CM (WIRE) ×3 IMPLANT

## 2019-01-12 NOTE — Interval H&P Note (Signed)
History and Physical Interval Note:  01/12/2019 7:30 AM  Ralph Dawson  has presented today for surgery, with the diagnosis of PAD.  The various methods of treatment have been discussed with the patient and family. After consideration of risks, benefits and other options for treatment, the patient has consented to  Procedure(s): ABDOMINAL AORTOGRAM W/LOWER EXTREMITY (Bilateral) as a surgical intervention.  The patient's history has been reviewed, patient examined, no change in status, stable for surgery.  I have reviewed the patient's chart and labs.  Questions were answered to the patient's satisfaction.     Quay Burow

## 2019-01-12 NOTE — Progress Notes (Signed)
    Called by RN as patient is post PV intervention and developed tachycardia, shortness of breath and chest pain. HR elevated in the 150s, difficult to determine on telemetry. EKG showed Afib RVR, no hx of the same. At the time of my arrival HR was starting to slow, back in SR, then briefly elevated into the 130s Afib again. Shortly afterwards returned to SR rate in the 80s. With slowed HR his symptoms resolved. Will give home Toprol XL dose now. Check labs. Did have some itching post intervention and given benadryl and solu-medrol. Question mild reaction to contrast? RN to monitor and call with further needs. Right groin site stable.   SignedReino Bellis, NP-C 01/12/2019, 3:19 PM Pager: [phone]

## 2019-01-12 NOTE — Progress Notes (Signed)
No further itching on hands after benadryl and solumedrol. No rash noted.

## 2019-01-12 NOTE — Significant Event (Signed)
Rapid Response Event Note  Overview: Cardiac - Tachycardiac   Initial Focused Assessment: Called by nursing staff to inform me about MEWS score 3 - Yellow for tachycardia. Per nursing staff, patient s/p PV procedure had some itching post procedure in post cath recovery, given Benadry and Solu-Medrol. Upon arrival, [name], was alert, endorses chest discomfort 2-3/10, HR 140s, narrow complex - appears SVT. RN gave Labetalol 10 mg IV and Toprol XL 25mg  PO. Lung sounds clear in all fields, not in acute distress, skin warm and dry currently. I walked to the desk to review the cardiac telemetry, and HR spontaneously improved SR 90s. SBP in the 130s, MAP > 65.   Interventions: -- No RRT Interventions   Plan of Care: -- Monitor BP/HR   Event Summary:  Call Time [place]  Ralph Dawson R

## 2019-01-12 NOTE — Progress Notes (Signed)
Patient was noted to have frequent episodes of short runs of SVT. Noted to be in sustained SVT 140-150's Notified Harlan Stains NP 1447 BP 181/97 Given labetalol 10 mg IV and obtained a 12-lead ECG. 1452 156/79 and rhythm breaking with short spurts of SR. 12=lead ECG obtained at 1452. Patient in [place] for an extended time then SVT 140's and back to SR. Complained of chest pain 2/10 without shortness of breath.  Episode of SVT 140's with no complaints of shortness of breath or chest pain. Remaining on 2 liters oxygen. 1530 SR 90's with BP 113/72, femoral site stable, level one. Patient sleeping.

## 2019-01-13 ENCOUNTER — Other Ambulatory Visit: Payer: Self-pay | Admitting: Physician Assistant

## 2019-01-13 ENCOUNTER — Encounter (HOSPITAL_COMMUNITY): Payer: Self-pay | Admitting: Cardiovascular Disease

## 2019-01-13 DIAGNOSIS — I252 Old myocardial infarction: Secondary | ICD-10-CM | POA: Diagnosis not present

## 2019-01-13 DIAGNOSIS — I70212 Atherosclerosis of native arteries of extremities with intermittent claudication, left leg: Secondary | ICD-10-CM | POA: Diagnosis not present

## 2019-01-13 DIAGNOSIS — I4892 Unspecified atrial flutter: Secondary | ICD-10-CM | POA: Diagnosis not present

## 2019-01-13 DIAGNOSIS — I483 Typical atrial flutter: Secondary | ICD-10-CM | POA: Diagnosis not present

## 2019-01-13 DIAGNOSIS — F17210 Nicotine dependence, cigarettes, uncomplicated: Secondary | ICD-10-CM | POA: Diagnosis not present

## 2019-01-13 DIAGNOSIS — E785 Hyperlipidemia, unspecified: Secondary | ICD-10-CM | POA: Diagnosis not present

## 2019-01-13 DIAGNOSIS — Z91041 Radiographic dye allergy status: Secondary | ICD-10-CM | POA: Diagnosis not present

## 2019-01-13 DIAGNOSIS — D72829 Elevated white blood cell count, unspecified: Secondary | ICD-10-CM | POA: Diagnosis not present

## 2019-01-13 DIAGNOSIS — T508X5A Adverse effect of diagnostic agents, initial encounter: Secondary | ICD-10-CM

## 2019-01-13 DIAGNOSIS — I129 Hypertensive chronic kidney disease with stage 1 through stage 4 chronic kidney disease, or unspecified chronic kidney disease: Secondary | ICD-10-CM | POA: Diagnosis not present

## 2019-01-13 DIAGNOSIS — N183 Chronic kidney disease, stage 3 unspecified: Secondary | ICD-10-CM | POA: Diagnosis not present

## 2019-01-13 DIAGNOSIS — I4891 Unspecified atrial fibrillation: Secondary | ICD-10-CM | POA: Diagnosis not present

## 2019-01-13 LAB — CBC
HCT: 44.6 % (ref 39.0–52.0)
Hemoglobin: 14.8 g/dL (ref 13.0–17.0)
MCH: 32 pg (ref 26.0–34.0)
MCHC: 33.2 g/dL (ref 30.0–36.0)
MCV: 96.5 fL (ref 80.0–100.0)
Platelets: 330 10*3/uL (ref 150–400)
RBC: 4.62 MIL/uL (ref 4.22–5.81)
RDW: 12.5 % (ref 11.5–15.5)
WBC: 24.8 10*3/uL — ABNORMAL HIGH (ref 4.0–10.5)
nRBC: 0 % (ref 0.0–0.2)

## 2019-01-13 LAB — BASIC METABOLIC PANEL
Anion gap: 10 (ref 5–15)
BUN: 18 mg/dL (ref 8–23)
CO2: 19 mmol/L — ABNORMAL LOW (ref 22–32)
Calcium: 8.6 mg/dL — ABNORMAL LOW (ref 8.9–10.3)
Chloride: 112 mmol/L — ABNORMAL HIGH (ref 98–111)
Creatinine, Ser: 1.42 mg/dL — ABNORMAL HIGH (ref 0.61–1.24)
GFR calc Af Amer: 54 mL/min — ABNORMAL LOW (ref >60)
GFR calc non Af Amer: 47 mL/min — ABNORMAL LOW (ref >60)
Glucose, Bld: 139 mg/dL — ABNORMAL HIGH (ref 70–99)
Potassium: 4.4 mmol/L (ref 3.5–5.1)
Sodium: 141 mmol/L (ref 135–145)

## 2019-01-13 MED ORDER — ASPIRIN EC 81 MG PO TBEC: 81.0000 mg | DELAYED_RELEASE_TABLET | Freq: Every day | ORAL | Status: DC

## 2019-01-13 NOTE — Progress Notes (Signed)
per

## 2019-01-13 NOTE — Discharge Summary (Addendum)
Discharge Summary    Patient ID: Ralph Dawson MRN: [id number]; DOB: 10-27-1940  Admit date: 01/12/2019 Discharge date: 01/13/2019  Primary Care Provider: Patient, No Pcp Per  Primary Cardiologist: Ralph Majestic, MD / PV - Dr. Gwenlyn Dawson Primary Electrophysiologist:  None   Discharge Diagnoses    Principal Problem:   Claudication in peripheral vascular disease ([place]) Active Problems:   HTN (hypertension)   Hyperlipidemia with target LDL less than 70   Chronic renal insufficiency, stage 3 (moderate)   Peripheral arterial disease (HCC)   Allergic reaction to contrast media   Leukocytosis   Allergies Allergies  Allergen Reactions   Contrast Media [Iodinated Diagnostic Agents] Itching    Possibly caused SVT vs afib post-op    Diagnostic Studies/Procedures   Pre Procedure Diagnosis: Peripheral arterial disease  Post Procedure Diagnosis: Peripheral arterial disease  Operators: Dr. Quay Dawson  Procedures Performed:               1.  Ultrasound-guided right common femoral access               2.  Abdominal aortogram/bilateral iliac angiogram               3.  Contralateral access (second order catheter placement)               4.  Left lower extremity runoff               5.  Placement of a spider distal protection device in the left below the knee popliteal artery               6.  Hawk 1 directional atherectomy followed by drug-coated balloon angioplasty distal left SFA  PROCEDURE DESCRIPTION:   The patient was brought to the second floor [facility] cath lab in the the postabsorptive state. He was premedicated with IV Versed and fentanyl. His right groin was prepped and shaved in usual sterile fashion. Xylocaine 1% was used for local anesthesia. A 5 French sheath was inserted into the   artery using standard Seldinger technique under ultrasound guidance (image was stored in the chart).  A 5 French pigtail catheters placed the distal abdominal aorta.  Distal  abdominal aortography and bilateral iliac angiography were performed.  Contralateral access was obtained with a crossover catheter and endhole catheter.  Left lower extremity angiography with runoff was performed using bolus chase, digital subtraction and step table technique.  Omnipaque dye was used for the entirety of the case.  Retrograde aortic pressure was monitored during the case.    Angiographic Data:   1: Abdominal aorta- renal arteries widely patent.  The infrarenal abdominal aorta was mildly atherosclerotic. 2: Left lower extremity- there was a 30 to 40% proximal left common iliac artery stenosis that did not appear to be hemodynamically significant.  There were tandem 95% stenoses in the mid to distal left SFA with three-vessel runoff 3: Right lower extremity- the right iliac system was widely patent.  A runoff was not performed because this was not the symptomatic leg and the creatinine clearance was 40  IMPRESSION: [name] has high-grade tandem mid to distal left SFA stenoses responsible for his symptoms.  We will proceed with [facility] 1 directional atherectomy followed by drug-coated balloon angioplasty using distal protection  Procedure Description: A 7 [place] multipurpose 55 cm Ansell sheath was placed over the iliac bifurcation into the left external iliac artery.  Patient received 7 units of heparin with an ACT of 367.  Total of 150 cc of contrast was administered during the case.  I was able to cross the tandem lesions with an 014 [place] core wire.  I ballooned both lesions with a 2.5 mm x 4 mm long [place] balloon.  I then placed a 6 mm spider distal protection device in the left below the knee popliteal artery.  I performed directional arthrectomy using a Hawk 1 LX device performing multiple circumferential cuts of both lesions removing a copious amount of atherosclerotic plaque.  I then performed drug-coated balloon angioplasty with a 5 mm x 120 mm long impact Admiral  drug-coated balloon for 2-1/2 minutes at 6 atm resulted in reduction of tandem 95% stenoses to 0% residual without dissection.  The patient tolerated procedure well.  I then recaptured the distal protection device.  The Ansell sheath was withdrawn across the bifurcation and exchanged over an 035 wire for a short 7 [place] sheath.  This was secured in place.  The patient was already on dual antiplatelet therapy including aspirin and Brilinta for his coronary artery disease.  Final Impression: Successful Hawk 1 directional arthrectomy followed by drug-coated balloon angioplasty using spider distal protection for tandem mid to distal left SFA stenoses in the setting of lifestyle limiting claudication.  The sheath will be removed and pressure held on the right groin once ACT falls below 170.  Patient be gently hydrated overnight and discharged home in the morning.  Follow-up lower extremity arterial Doppler studies will be obtained in our [facility] line office next week and I will see him back in the office 1 to 2 weeks thereafter.  The patient left lab in stable condition.  _____________   History of Present Illness     Ralph Dawson is a 78 y.o. male with HTN, HLD, tobacco abuse, CKD stage III by labs, CAD s/p prior PCI 04/2017, RBBB who was admitted for planned PV angiogram due to claudication of his left calf and abnormal noninvasive testing.   Hospital Course     1. PAD with claudication - full angio results as noted above. He underwent successful Hawk 1 directional arthrectomy followed by drug-coated balloon angioplasty using spider distal protection for tandem mid to distal left SFA stenoses in the setting of lifestyle limiting claudication. Has f/u scheduled 10/8 for repeat duplex and 10/16 with Dr. Gwenlyn Dawson. He will continue ASA + Brilinta for now. His ASA was moved to daytime instead of nighttime to align with hospital dosing. He said he did not need any Brilinta refills. Post procedurally he did notice some  itching and received benadryl and Solu Medrol for potential contrast media allergy. Will add to allergy list for the future.  2. Atrial fib/atrial flutter and brief run of bradycardia (NSR with blocked PACs?) - yesterday afternoon after procedure, he developed tachycardia. His EKG was suggestive of atrial fibrillation with telemetry more consistent with atrial flutter with 2:1 and variable block. He also briefly had an episode of transient bradycardia for less than 10 beats of sinus rhythm with blocked PACs. patient's telemetry and he did have runs of atrial flutter with 2-1 block as well as variable block.  This all occurred as he experienced a possible contrast reaction and was treated with Benadryl and Solu-Medrol.  This morning he is in normal sinus and he has no history of atrial fibrillation or flutter in the past. Dr. Burt Knack feels that rather than committing him to long-term anticoagulation, it would be reasonable to send him out with an event monitor and keep him  on dual antiplatelet therapy with aspirin and ticagrelor.  If he demonstrates recurrence of atrial fibrillation or flutter, he probably should be changed to clopidogrel with the addition of a direct oral anticoagulant drug. Of note recent TSH wnl. I have sent a message to schedulers to arrange the monitor. He has general cardiology f/u with Dr. Claiborne Billings in November.  3. Essential HTN - labile in cath lab yesterday but normal this AM. Continued on home regimen.  4. Hyperlipidemia - will continue home statin.  5. Possible contrast allergy - itching noted, then tachyarrhythmia. Received benadryl and Solu Medrol. Will add to allergy list for the future.  6. Leukocytosis - suspect related to steroids - pt is afebrile, no acute symptoms otherwise. Consider f/u CBC in follow-up.  7. CKD stage III by labs - labs show a baseline Cr around 1.3-1.5. Stable post-procedure.  Dr. Burt Knack has seen and examined the patient today and feels he is  stable for discharge. _____________  Discharge Vitals Blood pressure 112/75, pulse 62, temperature (!) 97.5 F (36.4 C), temperature source Oral, resp. rate 14, height '5\' 8"'  (1.727 m), weight 66 kg, SpO2 98 %.  Filed Weights   01/12/19 0608 01/13/19 0303  Weight: 65.8 kg 66 kg    Labs & Radiologic Studies    CBC Recent Labs    01/13/19 0353  WBC 24.8*  HGB 14.8  HCT 44.6  MCV 96.5  PLT 852   Basic Metabolic Panel Recent Labs    01/12/19 1529 01/13/19 0353  NA 141 141  K 3.5 4.4  CL 110 112*  CO2 20* 19*  GLUCOSE 249* 139*  BUN 14 18  CREATININE 1.44* 1.42*  CALCIUM 8.5* 8.6*  MG 1.9  --   _____________  Vas [place] Abi With/wo Tbi  Result Date: 12/16/2018 LOWER EXTREMITY DOPPLER STUDY Indications: Claudication, and peripheral artery disease. Patient presents with              left calf claudication symptoms, particularly with walking.              Sometimes it develops from walking 150 feet to his driveway and              back. The pain resolves with rest. No right leg symptoms at this              time. High Risk Factors: Hypertension, hyperlipidemia, current smoker, prior MI.  Vascular Interventions: Left CEA 9/09                         Right CEA 4/11. Comparison Study: Previous ABI's in 2/19 were 1.05 on the right and 1.01 on the                   left. Performing Technologist: Mariane Masters RVT  Examination Guidelines: A complete evaluation includes at minimum, Doppler waveform signals and systolic blood pressure reading at the level of bilateral brachial, anterior tibial, and posterior tibial arteries, when vessel segments are accessible. Bilateral testing is considered an integral part of a complete examination. Photoelectric Plethysmograph (PPG) waveforms and toe systolic pressure readings are included as required and additional duplex testing as needed. Limited examinations for reoccurring indications may be performed as noted.  ABI Findings:  +---------+------------------+-----+--------+--------+  Right     Rt Pressure (mmHg) Index Waveform Comment   +---------+------------------+-----+--------+--------+  Brachial  128                                         +---------+------------------+-----+--------+--------+  ATA       135                1.02  biphasic           +---------+------------------+-----+--------+--------+  PTA       149                1.12  biphasic           +---------+------------------+-----+--------+--------+  PERO      121                0.91  biphasic           +---------+------------------+-----+--------+--------+  Great Toe 82                 0.62                     +---------+------------------+-----+--------+--------+ +---------+------------------+-----+----------+-------+  Left      Lt Pressure (mmHg) Index Waveform   Comment  +---------+------------------+-----+----------+-------+  Brachial  133                                          +---------+------------------+-----+----------+-------+  ATA       60                 0.45  monophasic          +---------+------------------+-----+----------+-------+  PTA       69                 0.52  monophasic          +---------+------------------+-----+----------+-------+  PERO      62                 0.47  monophasic          +---------+------------------+-----+----------+-------+  Great Toe 43                 0.32                      +---------+------------------+-----+----------+-------+ +-------+-----------+-----------+------------+------------+  ABI/TBI Today's ABI Today's TBI Previous ABI Previous TBI  +-------+-----------+-----------+------------+------------+  Right   1.02        0.62        1.05         0.59          +-------+-----------+-----------+------------+------------+  Left    0.52        0.32        1.01         0.60          +-------+-----------+-----------+------------+------------+ Right ABIs appear increased compared to prior study on 05/26/17. Left ABIs appear  decreased compared to prior study on 05/26/17.  Summary: Right: Resting right ankle-brachial index is within normal range. No evidence of significant right lower extremity arterial disease. The right toe-brachial index is abnormal. Left: Resting left ankle-brachial index indicates moderate left lower extremity arterial disease. The left toe-brachial index is abnormal.  *See table(s) above for measurements and observations. See arterial duplex report.  Vascular consult recommended. Electronically signed by Larae Grooms MD on 12/16/2018 at 5:56:48 PM.    Final    Vas [place] Lower Extremity Arterial Duplex  Result Date: 12/16/2018 LOWER EXTREMITY ARTERIAL DUPLEX STUDY Indications: Claudication, and peripheral artery disease. Patient presents with  left calf claudication symptoms, particularly with walking.              Sometimes it develops from walking 150 feet to his driveway and              back. The pain resolves with rest. No right leg symptoms at this              time. High Risk Factors: Hypertension, hyperlipidemia, current smoker, prior MI.  Vascular Interventions: Left CEA 9/09                         Right CEA 4/11. Current ABI:            Today's ABIs are 1.02 on the right and 0.52 on the left. Comparison Study: Prior arterial duplex 09/08/13 showed >50% bilateral common                   iliac artery stenosis and L SFA stenosis of 50-69%. Performing Technologist: Mariane Masters RVT  Examination Guidelines: A complete evaluation includes B-mode imaging, spectral Doppler, color Doppler, and power Doppler as needed of all accessible portions of each vessel. Bilateral testing is considered an integral part of a complete examination. Limited examinations for reoccurring indications may be performed as noted.  +-----------+--------+-----+---------------+-----------------+-----------------+  LEFT        PSV cm/s Ratio Stenosis        Waveform          Comments            +-----------+--------+-----+---------------+-----------------+-----------------+  CFA Prox    140                            triphasic         plaque             +-----------+--------+-----+---------------+-----------------+-----------------+  DFA         351                            triphasic         elevated                                                                         velocities         +-----------+--------+-----+---------------+-----------------+-----------------+  SFA Prox    88                             nearly biphasic   plaque             +-----------+--------+-----+---------------+-----------------+-----------------+  SFA Mid     154            30-49% stenosis monophasic        plaque             +-----------+--------+-----+---------------+-----------------+-----------------+  SFA Distal  461            75-99% stenosis monophasic        plaque, bruit,  narrowing          +-----------+--------+-----+---------------+-----------------+-----------------+  POP Prox    38                             monophasic        plaque             +-----------+--------+-----+---------------+-----------------+-----------------+  POP Distal  24                             monophasic                           +-----------+--------+-----+---------------+-----------------+-----------------+  TP Trunk    30                             monophasic                           +-----------+--------+-----+---------------+-----------------+-----------------+  ATA Prox    15                             dampened                                                                         monophasic                           +-----------+--------+-----+---------------+-----------------+-----------------+  ATA Mid     9                              monophasic                           +-----------+--------+-----+---------------+-----------------+-----------------+  ATA  Distal  8                              monophasic                           +-----------+--------+-----+---------------+-----------------+-----------------+  PTA Prox    15                             dampened                                                                         monophasic                           +-----------+--------+-----+---------------+-----------------+-----------------+  PTA Mid     14  monophasic                           +-----------+--------+-----+---------------+-----------------+-----------------+  PTA Distal  13                             monophasic                           +-----------+--------+-----+---------------+-----------------+-----------------+  PERO Prox   11                             dampened                                                                         monophasic                           +-----------+--------+-----+---------------+-----------------+-----------------+  PERO Mid    9                              monophasic                           +-----------+--------+-----+---------------+-----------------+-----------------+  PERO Distal 11                             monophasic                           +-----------+--------+-----+---------------+-----------------+-----------------+ A focal velocity elevation of 154 cm/s was obtained at mid SFA with a VR of 2.0. Findings are characteristic of 30-49% stenosis. A 2nd focal velocity elevation was visualized, measuring 461 cm/s at distal SFA with post stenotic turbulence with a VR of 7.44. Findings are characteristic of 75-99% stenosis.  Summary: Left: Atherosclerosis throughout the left lower extremity. 75-99% stenosis noted in the superficial femoral artery. Moderate progression is noted compared to previous study.  See table(s) above for measurements and observations. See ABI report. Vascular consult recommended. Electronically signed by Larae Grooms MD on 12/16/2018 at  5:57:21 PM.    Final    Disposition   Pt is being discharged home today in good condition.  Follow-up Plans & Appointments    Follow-up Information    [facility]. Go to.   Specialty: Cardiology Why: You will have a follow-up ultrasound on your legs 01/19/19 at 9am, and an appointment with Dr. Gwenlyn Dawson as outlined below on 01/27/19. The office will also call you to arrange a heart monitor to wear to watch your heart rhythm. Contact information: [address] [place] [phone]         Discharge Instructions    Diet - low sodium heart healthy   Complete by: As directed    Discharge instructions   Complete by: As directed    Your aspirin was changed to daytime dosing to align with the schedule you were on in  the hospital.  Dr. Kennon Holter office will call you to set up a heart monitor to wear to watch your heart rhythm.   Increase activity slowly   Complete by: As directed    No driving for 2 days. No lifting over 5 lbs for 1 week. No sexual activity for 1 week. Keep procedure site clean & dry. If you notice increased pain, swelling, bleeding or pus, call/return!  You may shower, but no soaking baths/hot tubs/pools for 1 week.      Discharge Medications   Allergies as of 01/13/2019      Reactions   Contrast Media [iodinated Diagnostic Agents] Itching   Possibly caused SVT vs afib post-op      Medication List    TAKE these medications   acetaminophen 500 MG tablet Commonly known as: TYLENOL Take 1,000 mg by mouth every 6 (six) hours as needed for moderate pain.   albuterol 108 (90 Base) MCG/ACT inhaler Commonly known as: VENTOLIN HFA Inhale 1-2 puffs into the lungs every 6 (six) hours as needed (wheezing/shortness of breath.).   aspirin EC 81 MG tablet Take 1 tablet (81 mg total) by mouth daily. What changed: when to take this   atorvastatin 80 MG tablet Commonly known as: LIPITOR TAKE 1 TABLET BY MOUTH EVERY DAY  AT 6PM    donepezil 5 MG tablet Commonly known as: ARICEPT Take 5 mg by mouth at bedtime.   DULoxetine 60 MG capsule Commonly known as: CYMBALTA Take 60 mg by mouth at bedtime.   ipratropium 0.06 % nasal spray Commonly known as: ATROVENT Place 1 spray into the nose 3 (three) times daily as needed (allergies/congestion.).   isosorbide dinitrate 30 MG tablet Commonly known as: ISORDIL Take 30 mg by mouth at bedtime.   lisinopril 5 MG tablet Commonly known as: ZESTRIL Take 5 mg by mouth at bedtime.   metoprolol succinate 25 MG 24 hr tablet Commonly known as: TOPROL-XL TAKE 1 TABLET BY MOUTH EVERY DAY    Muscle Rub 10-15 % Crea Apply 1 application topically 4 (four) times daily as needed for muscle pain.   nitroGLYCERIN 0.4 MG SL tablet Commonly known as: NITROSTAT Place 1 tablet (0.4 mg total) under the tongue every 5 (five) minutes as needed for chest pain.   omeprazole 20 MG capsule Commonly known as: PRILOSEC Take 20 mg by mouth at bedtime.   tamsulosin 0.4 MG Caps capsule Commonly known as: FLOMAX Take 0.4 mg by mouth at bedtime.   ticagrelor 90 MG Tabs tablet Commonly known as: BRILINTA Take 1 tablet (90 mg total) by mouth 2 (two) times daily.        Acute coronary syndrome (MI, NSTEMI, STEMI, etc) this admission?: No.    Outstanding Labs/Studies   N/A  Duration of Discharge Encounter   Greater than 30 minutes including physician time.  Signed, Charlie Pitter, PA-C 01/13/2019, 12:29 PM

## 2019-01-13 NOTE — Progress Notes (Addendum)
Progress Note  Patient Name: Ralph Dawson Date of Encounter: 01/13/2019  Primary Cardiologist: Nicki Guadalajara, MD  Subjective   Feeling fine this AM; wants to go home.  Yesterday afternoon around 3pm had intermittent episodes of rapid atrial fib post-procedure. This quieted down until midnight when he had what appears to be intermittent SVT vs atrial flutter. It also looks like he had a short period of bradycardia in the upper 30s at 00:55 but was not specifically symptomatic with this.  Regarding events yesterday and overnight - at first he said he was asymptomatic with rapid HRs last night but then later said he did feel some chest tightness and SOB. He speculates maybe this was due to anxiety, COPD or not drinking enough liquid with his sandwich last night but we discussed that he briefly went out of rhythm.   Monitor is currently off since he was up in the bathroom cleaning up but clinically in NSR this AM.  Inpatient Medications    Scheduled Meds: . aspirin EC  81 mg Oral Daily  . atorvastatin  80 mg Oral QHS  . donepezil  5 mg Oral QHS  . DULoxetine  60 mg Oral QHS  . isosorbide dinitrate  30 mg Oral QHS  . lisinopril  5 mg Oral QHS  . metoprolol succinate  25 mg Oral QHS  . nicotine  14 mg Transdermal Daily  . sodium chloride flush  3 mL Intravenous Q12H  . sodium chloride flush  3 mL Intravenous Q12H  . tamsulosin  0.4 mg Oral QHS  . ticagrelor  90 mg Oral BID   Continuous Infusions: . sodium chloride     PRN Meds: sodium chloride, acetaminophen, albuterol, hydrALAZINE, labetalol, morphine injection, nitroGLYCERIN, ondansetron (ZOFRAN) IV, sodium chloride flush   Vital Signs    Vitals:   01/12/19 1510 01/12/19 2056 01/12/19 2220 01/13/19 0303  BP: 133/72 131/78 (!) 135/55 112/75  Pulse:  79  (!) 151  Resp:  14    Temp:  98.2 F (36.8 C)  (!) 97.5 F (36.4 C)  TempSrc:  Oral  Oral  SpO2:  97%  98%  Weight:    66 kg  Height:        Intake/Output  Summary (Last 24 hours) at 01/13/2019 0929 Last data filed at 01/13/2019 0313 Gross per 24 hour  Intake 806.82 ml  Output 300 ml  Net 506.82 ml   Last 3 Weights 01/13/2019 01/12/2019 12/27/2018  Weight (lbs) 145 lb 8.1 oz 145 lb 145 lb  Weight (kg) 66 kg 65.772 kg 65.772 kg     Telemetry    NSR, with brief atrial fib RVR, atrial flutter RVR, and SVT - also with brief bradyardia with HR 37 for several beats, suspected NSR with blocked PACs - Personally Reviewed  ECG    Appears to show AF RVR with RBBB - Personally Reviewed  Physical Exam   GEN: No acute distress.  HEENT: Normocephalic, atraumatic, sclera non-icteric. Neck: No JVD or bruits. Cardiac: RRR no murmurs, rubs, or gallops.  Radials/DP/PT 1+ and equal bilaterally.  Respiratory: Clear to auscultation bilaterally. Breathing is unlabored. GI: Soft, nontender, non-distended, BS +x 4. MS: no deformity. Extremities: No clubbing or cyanosis. No edema. Distal pedal pulses are 2+ and equal bilaterally. Right groin cath site without hematoma, ecchymosis, or bruit. Neuro:  AAOx3. Follows commands. Hard of hearing. Psych:  Responds to questions appropriately with a normal affect.  Labs    High Sensitivity Troponin:  No results  for input(s): TROPONINIHS in the last 720 hours.    Cardiac EnzymesNo results for input(s): TROPONINI in the last 168 hours. No results for input(s): TROPIPOC in the last 168 hours.   Chemistry Recent Labs  Lab 01/12/19 1529 01/13/19 0353  NA 141 141  K 3.5 4.4  CL 110 112*  CO2 20* 19*  GLUCOSE 249* 139*  BUN 14 18  CREATININE 1.44* 1.42*  CALCIUM 8.5* 8.6*  GFRNONAA 46* 47*  GFRAA 54* 54*  ANIONGAP 11 10     Hematology Recent Labs  Lab 01/13/19 0353  WBC 24.8*  RBC 4.62  HGB 14.8  HCT 44.6  MCV 96.5  MCH 32.0  MCHC 33.2  RDW 12.5  PLT 330    BNPNo results for input(s): BNP, PROBNP in the last 168 hours.   DDimer No results for input(s): DDIMER in the last 168 hours.    Radiology    No results found.  Patient Profile     78 y.o. male with HTN, HLD, tobacco abuse, CKD stage III by labs, CAD s/p prior PCI 04/2017, RBBB who was admitted for planned PV angiogram due to claudication of his left calf and abnormal noninvasive testing. Post-procedure course notable for post-cath itching (? Contrast allergy) and suspected AF RVR.  Assessment & Plan    1. PAD - full angio results in EMR. He underwent successful Hawk 1 directional arthrectomy followed by drug-coated balloon angioplasty using spider distal protection for tandem mid to distal left SFA stenoses in the setting of lifestyle limiting claudication. Has f/u scheduled 10/8 for repeat duplex and 10/16 with Dr. Allyson SabalBerry.  2. Atrial fib with RVR, also appears atrial flutter/SVT and brief run of bradycardia (NSR with blocked PACs?) - will have him hooked back up to monitor this AM and discuss plan with MD. Given itching around time of cath, wonder if this was possibly related to allergic reaction to contrast media or treatment for such.  3. Essential HTN - labile in cath lab yesterday but normal this AM.  4. Hyperlipidemia - will continue home statin.  5. Possible contrast allergy - itching noted, then tachyarrhythmia. Received benadryl and Solu Medrol. Will add to allergy list for the future.  6. Leukocytosis - suspect related to steroids - pt is afebrile, no acute symptoms otherwise.  7. CKD stage III by labs - labs show a baseline Cr around 1.3-1.5. Stable post-procedure.  For questions or updates, please contact [facility] Please consult [email or web address] for contact info under Cardiology/STEMI.  Signed, Laurann Montanaayna N Dunn, PA-C 01/13/2019, 9:29 AM    Patient seen, examined. Available data reviewed. Agree with findings, assessment, and plan as outlined by Ronie Spiesayna Dunn, PA-C.  The patient is independently interviewed and examined.  JVP is normal, lungs are clear, heart is regular rate and rhythm with no murmur  gallop, abdomen is soft and nontender, right groin site is clear, extremities have no edema.  I reviewed the patient's telemetry and he did have runs of atrial flutter with 2-1 block as well as variable block.  This all occurred as he experienced a possible contrast reaction and was treated with Benadryl and Solu-Medrol.  This morning he is in normal sinus and he has no history of atrial fibrillation or flutter in the past.  I think rather than committing him to long-term anticoagulation, it would be reasonable to send him out with an event monitor and keep him on dual antiplatelet therapy with aspirin and ticagrelor.  If he demonstrates recurrence of atrial  fibrillation or flutter, he probably should be changed to clopidogrel with the addition of a direct oral anticoagulant drug.  He will follow-up with Dr. Gwenlyn Found as an outpatient.  Sherren Mocha, M.D. 01/13/2019 11:40 AM

## 2019-01-16 ENCOUNTER — Telehealth: Payer: Self-pay | Admitting: *Deleted

## 2019-01-16 NOTE — Telephone Encounter (Signed)
New message   Patient has an order in system for a heart monitor. Please contact the patient with further instructions on when he will receive this monitor.

## 2019-01-17 NOTE — Telephone Encounter (Signed)
[employer] to ship a 30 day cardiac event monitor to the patients home.  Instructions reviewed briefly as they are included in the monitor kit. 

## 2019-01-19 ENCOUNTER — Ambulatory Visit (HOSPITAL_COMMUNITY)
Admission: RE | Admit: 2019-01-19 | Discharge: 2019-01-19 | Disposition: A | Discharge status: Home / Self Care | Payer: Medicare Other | Source: Ambulatory Visit | Attending: Cardiology | Admitting: Cardiology

## 2019-01-19 ENCOUNTER — Other Ambulatory Visit (HOSPITAL_COMMUNITY): Payer: Self-pay | Admitting: Cardiovascular Disease

## 2019-01-19 ENCOUNTER — Ambulatory Visit (HOSPITAL_BASED_OUTPATIENT_CLINIC_OR_DEPARTMENT_OTHER)
Admission: RE | Admit: 2019-01-19 | Discharge: 2019-01-19 | Disposition: A | Discharge status: Home / Self Care | Payer: Medicare Other | Source: Ambulatory Visit | Attending: Cardiovascular Disease | Admitting: Cardiovascular Disease

## 2019-01-19 ENCOUNTER — Other Ambulatory Visit: Payer: Self-pay | Admitting: Cardiovascular Disease

## 2019-01-19 ENCOUNTER — Other Ambulatory Visit: Payer: Self-pay

## 2019-01-19 DIAGNOSIS — I739 Peripheral vascular disease, unspecified: Secondary | ICD-10-CM | POA: Insufficient documentation

## 2019-01-19 DIAGNOSIS — Z9862 Peripheral vascular angioplasty status: Secondary | ICD-10-CM

## 2019-01-25 ENCOUNTER — Encounter: Payer: Self-pay | Admitting: Cardiovascular Disease

## 2019-01-25 ENCOUNTER — Ambulatory Visit (INDEPENDENT_AMBULATORY_CARE_PROVIDER_SITE_OTHER): Payer: BC Managed Care – PPO

## 2019-01-25 DIAGNOSIS — I4892 Unspecified atrial flutter: Secondary | ICD-10-CM

## 2019-01-26 ENCOUNTER — Telehealth: Payer: Self-pay

## 2019-01-26 NOTE — Telephone Encounter (Signed)
This is a cardiology patient of Dr. Evette Georges.  The rhythm strip should probably be forwarded to him.

## 2019-01-26 NOTE — Telephone Encounter (Signed)
Received from [facility] a critical fax pt was in AFIB /RVR sustained and sinus w/VCD per report. Report Given to Dr Kennon Holter RN  S/w pt he states that he did wake up for a few seconds. He states that this happens frequently when le is on his left side. He will "just turn over" and it will go away. He states that he has no sx then this happened/happens.  Will forward to JB/nurse for review before appt

## 2019-01-27 ENCOUNTER — Other Ambulatory Visit: Payer: Self-pay

## 2019-01-27 ENCOUNTER — Ambulatory Visit (INDEPENDENT_AMBULATORY_CARE_PROVIDER_SITE_OTHER): Payer: BC Managed Care – PPO | Admitting: Cardiovascular Disease

## 2019-01-27 ENCOUNTER — Encounter: Payer: Self-pay | Admitting: Cardiovascular Disease

## 2019-01-27 ENCOUNTER — Telehealth: Payer: Self-pay

## 2019-01-27 DIAGNOSIS — I6523 Occlusion and stenosis of bilateral carotid arteries: Secondary | ICD-10-CM

## 2019-01-27 DIAGNOSIS — I480 Paroxysmal atrial fibrillation: Secondary | ICD-10-CM | POA: Diagnosis not present

## 2019-01-27 DIAGNOSIS — I739 Peripheral vascular disease, unspecified: Secondary | ICD-10-CM | POA: Diagnosis not present

## 2019-01-27 NOTE — Assessment & Plan Note (Signed)
Mr. Belton returns today for follow-up of his recent left SFA intervention for claudication on 01/12/2019.  He was initially sent to me by Dr. Claiborne Billings.  His Dopplers performed 12/16/2018 revealed a left ABI of 1.52 with a high-frequency signal in his mid to distal left SFA.  On angiography he had tandem 95% mid SFA stenoses and underwent Hawk 1 directional atherectomy followed by drug-coated balloon angioplasty with excellent result.  He had three-vessel runoff.  His follow-up Doppler study performed 01/17/2019 revealed an increase in his left ABI up to 1.01 with normal velocities.  His claudication has resolved.  He has mild ecchymosis at his right common femoral puncture site.  We will repeat lower extremity arterial Doppler studies in 6 months.

## 2019-01-27 NOTE — Progress Notes (Signed)
01/27/2019 Ralph Dawson   Jul 31, 1940  [id number]  Primary Physician Patient, No Pcp Per Primary Cardiologist: Ralph Gess MD Ralph Dawson, Ralph Dawson  HPI:  Ralph Dawson is a 78 y.o.  thin appearing married Caucasian male father of 4, grandfather of 2 grandchildren referred by Ralph Dawson for peripheral vascular valuation because of lifestyle limiting claudication.  I last saw him in the office 12/27/2018. 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  He did have a myocardial infarction back in January of last year and had a cardiac catheterization, PCI and drug-eluting stent of the AV groove circumflex by Ralph Dawson.  He currently denies chest pain.  The claudication of his left calf for several years with recent Dopplers performed 12/20/2018 revealing a left ABI 0.52 with high frequency signals in his left common femoral and distal left SFA.  I performed peripheral angiography 01/12/2019 revealing 95% tandem mid left SFA stenoses with three-vessel runoff.  I performed [facility] 1 directional atherectomy followed by drug-coated balloon angioplasty with excellent angiographic result.  He was discharged home the following day.  He did have some tachyarrhythmias on telemetry I would however which appeared to be PAF versus a flutter evaluated by Ralph Dawson.  A 2-week Zio patch was placed for further evaluation.  His follow-up Doppler studies performed 01/19/2019 revealed an increase in his left ABI to 1.01 with normal velocities.  His claudication has resolved.   Current Meds  Medication Sig   acetaminophen (TYLENOL) 500 MG tablet Take 1,000 mg by mouth every 6 (six) hours as needed for moderate pain.   albuterol (PROVENTIL HFA;VENTOLIN HFA) 108 (90 Base) MCG/ACT inhaler Inhale 1-2 puffs into the lungs every 6 (six) hours as needed  (wheezing/shortness of breath.).    aspirin EC 81 MG tablet Take 1 tablet (81 mg total) by mouth daily.   atorvastatin (LIPITOR) 80 MG tablet TAKE 1 TABLET BY MOUTH EVERY DAY AT 6PM (Patient taking differently: Take 80 mg by mouth at bedtime. )   donepezil (ARICEPT) 5 MG tablet Take 5 mg by mouth at bedtime.    DULoxetine (CYMBALTA) 60 MG capsule Take 60 mg by mouth at bedtime.    ipratropium (ATROVENT) 0.06 % nasal spray Place 1 spray into the nose 3 (three) times daily as needed (allergies/congestion.).    isosorbide dinitrate (ISORDIL) 30 MG tablet Take 30 mg by mouth at bedtime.   lisinopril (ZESTRIL) 5 MG tablet Take 5 mg by mouth at bedtime.   Menthol-Methyl Salicylate (MUSCLE RUB) 10-15 % CREA Apply 1 application topically 4 (four) times daily as needed for muscle pain.    metoprolol succinate (TOPROL-XL) 25 MG 24 hr tablet TAKE 1 TABLET BY MOUTH EVERY DAY (Patient taking differently: Take 25 mg by mouth at bedtime. )   nitroGLYCERIN (NITROSTAT) 0.4 MG SL tablet Place 1 tablet (0.4 mg total) under the tongue every 5 (five) minutes as needed for chest pain.   omeprazole (PRILOSEC) 20 MG capsule Take 20 mg by mouth at bedtime.   tamsulosin (FLOMAX) 0.4 MG CAPS Take 0.4 mg by mouth at bedtime.    ticagrelor (BRILINTA) 90 MG TABS tablet Take 1 tablet (90 mg total) by mouth 2 (two) times daily.     Allergies  Allergen Reactions   Contrast Media [Iodinated Diagnostic Agents] Itching    Caused itching. Possibly caused  SVT vs afib post-op    Social History   Socioeconomic History   Marital status: Married    Spouse name: Not on file   Number of children: Not on file   Years of education: Not on file   Highest education level: Not on file  Occupational History   Not on file  Social Needs   Financial resource strain: Not on file   Food insecurity    Worry: Not on file    Inability: Not on file   Transportation needs    Medical: Not on file    Non-medical: Not  on file  Tobacco Use   Smoking status: Current Some Day Smoker    Packs/day: 0.25    Years: 65.00    Pack years: 16.25    Types: Cigarettes    Last attempt to quit: 06/14/2017    Years since quitting: 1.6   Smokeless tobacco: Never Used   Tobacco comment: smoking a 10 cigarettes a week   Substance and Sexual Activity   Alcohol use: No   Drug use: No   Sexual activity: Yes  Lifestyle   Physical activity    Days per week: Not on file    Minutes per session: Not on file   Stress: Not on file  Relationships   Social connections    Talks on phone: Not on file    Gets together: Not on file    Attends religious service: Not on file    Active member of club or organization: Not on file    Attends meetings of clubs or organizations: Not on file    Relationship status: Not on file   Intimate partner violence    Fear of current or ex partner: Not on file    Emotionally abused: Not on file    Physically abused: Not on file    Forced sexual activity: Not on file  Other Topics Concern   Not on file  Social History Narrative   Not on file     Review of Systems: General: negative for chills, fever, night sweats or weight changes.  Cardiovascular: negative for chest pain, dyspnea on exertion, edema, orthopnea, palpitations, paroxysmal nocturnal dyspnea or shortness of breath Dermatological: negative for rash Respiratory: negative for cough or wheezing Urologic: negative for hematuria Abdominal: negative for nausea, vomiting, diarrhea, bright red blood per rectum, melena, or hematemesis Neurologic: negative for visual changes, syncope, or dizziness All other systems reviewed and are otherwise negative except as noted above.    Blood pressure 138/64, pulse 77, temperature (!) 97.2 F (36.2 C), height 5\' 8"  (1.727 m), weight 149 lb (67.6 kg).  General appearance: alert and no distress Neck: no adenopathy, no carotid bruit, no JVD, supple, symmetrical, trachea midline and  thyroid not enlarged, symmetric, no tenderness/mass/nodules Lungs: clear to auscultation bilaterally Heart: regular rate and rhythm, S1, S2 normal, no murmur, click, rub or gallop Extremities: extremities normal, atraumatic, no cyanosis or edema Pulses: 2+ and symmetric Skin: Skin color, texture, turgor normal. No rashes or lesions Neurologic: Alert and oriented X 3, normal strength and tone. Normal symmetric reflexes. Normal coordination and gait  EKG not performed today  ASSESSMENT AND PLAN:   Peripheral arterial disease (HCC) Mr. Knipple returns today for follow-up of his recent left SFA intervention for claudication on 01/12/2019.  He was initially sent to me by Dr. Tresa EndoKelly.  His Dopplers performed 12/16/2018 revealed a left ABI of 1.52 with a high-frequency signal in his mid to distal left SFA.  On angiography he had tandem 95% mid SFA stenoses and underwent Hawk 1 directional atherectomy followed by drug-coated balloon angioplasty with excellent result.  He had three-vessel runoff.  His follow-up Doppler study performed 01/17/2019 revealed an increase in his left ABI up to 1.01 with normal velocities.  His claudication has resolved.  He has mild ecchymosis at his right common femoral puncture site.  We will repeat lower extremity arterial Doppler studies in 6 months.  PAF (paroxysmal atrial fibrillation) [facility]) Mr. Hernon had PAF on telemetry while in the hospital.  This has not been one of his problems in the past.  He is currently wearing a 2-week Zio patch to further evaluate.  He is on aspirin and ticagrelor for his CAD.  He is scheduled to follow-up with Dr. Claiborne Billings in the office in 1 month.      Lorretta Harp MD FACP,FACC,FAHA, [facility] 01/27/2019 9:14 AM

## 2019-01-27 NOTE — Assessment & Plan Note (Signed)
Ralph Dawson had PAF on telemetry while in the hospital.  This has not been one of his problems in the past.  He is currently wearing a 2-week Zio patch to further evaluate.  He is on aspirin and ticagrelor for his CAD.  He is scheduled to follow-up with Dr. Claiborne Billings in the office in 1 month.

## 2019-01-27 NOTE — Telephone Encounter (Signed)
Received monitor strip from [facility]. Dr. Gwenlyn Found reviewed and recommended pt schedule an appointment with APP or Dr. Claiborne Billings to review results. Appointment scheduled for 10/21 with Jory Sims at 1:15pm. Will place strip in her box to review.

## 2019-01-27 NOTE — Patient Instructions (Addendum)
Medication Instructions:  Your physician recommends that you continue on your current medications as directed. Please refer to the Current Medication list given to you today.  If you need a refill on your cardiac medications before your next appointment, please call your pharmacy.   Lab work: NONE If you have labs (blood work) drawn today and your tests are completely normal, you will receive your results only by: Marland Kitchen MyChart Message (if you have MyChart) OR . A paper copy in the mail If you have any lab test that is abnormal or we need to change your treatment, we will call you to review the results.  Testing/Procedures: Your physician has requested that you have a lower or upper extremity arterial duplex. This test is an ultrasound of the arteries in the legs or arms. It looks at arterial blood flow in the legs and arms. Allow one hour for Lower and Upper Arterial scans. There are no restrictions or special instructions DUE IN 6 MONTHS  Your physician has requested that you have an ankle brachial index (ABI). During this test an ultrasound and blood pressure cuff are used to evaluate the arteries that supply the arms and legs with blood. Allow thirty minutes for this exam. There are no restrictions or special instructions. DUE IN 6 MONTHS   Follow-Up: At [facility], you and your health needs are our priority.  As part of our continuing mission to provide you with exceptional heart care, we have created designated Provider Care Teams.  These Care Teams include your primary Cardiologist (physician) and Advanced Practice Providers (APPs -  Physician Assistants and Nurse Practitioners) who all work together to provide you with the care you need, when you need it. . You MAY SCHEDULE a follow up appointment AS NEEDED. You may see Dr. Gwenlyn Found or one of the following Advanced Practice Providers on your designated Care Team:   . Kerin Ransom, PA-C . Daleen Snook Kroeger, PA-C . Sande Rives,  PA-C ____________________ . Almyra Deforest, PA-C . Fabian Sharp, PA-C . Jory Sims, DNP . Rosaria Ferries, PA-C   Any Other Special Instructions Will Be Listed Below (If Applicable). YOU HAVE AN UPCOMING APPOINTMENT WITH DR. Shelva Majestic ON 02/27/2019 AT 4:00PM

## 2019-01-30 NOTE — Progress Notes (Signed)
Cardiology Office Note   Date:  02/01/2019   ID:  Ralph GoadGeorge E Circle, DOB 11/16/1940, MRN [id number]  PCP:  Patient, No Pcp Per  Cardiologist: Dr.Kelly  Vascular:Dr.Berry  CC: Monitor results.    History of Present Illness: Ralph Dawson is a 78 y.o. male who presents for ongoing assessment and management of coronary artery disease, hypertension, and hyperlipidemia.  The patient has a history of an MI in 2019 requiring cardiac catheterization.  Patient had PCI and DES of the AV groove circumflex by Dr. Eldridge DaceVaranasi.    He is also followed by Dr. Allyson SabalBerry for peripheral arterial disease, with peripheral angiography on 01/12/2019 revealing 95% tandem mid left FSA stenosis with three-vessel runoff.  The patient had a Hawk 1 directional atherectomy followed by a drug-coated balloon angioplasty with excellent angiographic result.  Post procedure the patient had some tachyarrhythmias on telemetry which appeared to be PAF versus atrial flutter.  This was evaluated by Dr. Excell Seltzerooper and a 2-week Zio patch monitor was placed for further evaluation.    Cardiac Monitor has alarmed on 02/23/2019 revealing atrial fib with RVR. It has been recommended by Dr. Excell Seltzerooper that if he needs anticoagulation he should be changed to Plavix antiplatelet medication from Brilinta.   Today in the office, the patient states he cannot tell if he is in the past irregular heart rate other than the fact that he gets short of breath.  He thought it was related to his smoking.  He states that he can sometimes hear his heart rate going fast when he lays on his left side.  He denies associated chest pain or dizziness.  Past Medical History:  Diagnosis Date  . Arthritis    "hands, feet" (04/27/2017)  . Atrial fibrillation/flutter (HCC)    a. during admission 01/2019 for PAD angiogram - isolated event after allergic reaction/steroids, monitor planned.  . Bradycardia    a. brief episode of nocturnal bradycardia 01/2019 (?NSR with blocked  PACs).  Marland Kitchen. CAD (coronary artery disease)   . Carotid stenosis 09/24/2011   R ICA patent w/ hx of endarterectomy, stenosis 1-39%;  L ICA patent w/ hx of endarterectomy; external carotids appear patent; see imaging tab for full report  . Chest heaviness 02/25/2009   PVCs, atrial bigeminy  . CKD (chronic kidney disease), stage III   . Claudication (HCC) 06/17/2011   LE doppler - bilateral ABIs normal values at rest; R CIA >50% diameter reduction L CIA 0-49% reduction; bilateral SFAs mild/mod mixed density plaque throughout suggesting 50-69% diameter reduction  . Contrast media allergy   . GERD (gastroesophageal reflux disease)   . Hyperlipidemia   . Hypertension 02/13/2010   echo - EF >55%; mild mitral annular calcification; mild aortic valve sclerosis  . NSTEMI (non-ST elevated myocardial infarction) (HCC) 04/26/2017   [name]/notes 04/27/2017  . PAD (peripheral artery disease) (HCC)    a. s/p LLE intervention on 01/2019.  Marland Kitchen. RBBB (right bundle branch block) 02/13/2010   R/P MV - EF 67%; normal perfusion all regions; no significant wall abnormalties noted  . Tobacco abuse     Past Surgical History:  Procedure Laterality Date  . ABDOMINAL AORTOGRAM W/LOWER EXTREMITY Bilateral 01/12/2019   Procedure: ABDOMINAL AORTOGRAM W/LOWER EXTREMITY;  Surgeon: Runell GessBerry, Jonathan J, MD;  Location: [facility] INVASIVE CV LAB;  Service: Cardiovascular;  Laterality: Bilateral;  . APPENDECTOMY    . ARTERIAL BYPASS SURGRY     pt unaware of this OR on 04/27/2017  . BACK SURGERY    . CAROTID ENDARTERECTOMY  Right 07/15/2009   Right CEA  . CAROTID ENDARTERECTOMY Left 12/13/2007   Left CEA  . CORONARY ANGIOPLASTY WITH STENT PLACEMENT  04/27/2017  . CORONARY STENT INTERVENTION N/A 04/27/2017   Procedure: CORONARY STENT INTERVENTION;  Surgeon: Corky Crafts, MD;  Location: [facility] INVASIVE CV LAB;  Service: Cardiovascular;  Laterality: N/A;  . LAPAROSCOPIC CHOLECYSTECTOMY    . LEFT HEART CATH AND CORONARY ANGIOGRAPHY N/A 04/27/2017    Procedure: LEFT HEART CATH AND CORONARY ANGIOGRAPHY;  Surgeon: Corky Crafts, MD;  Location: [facility] INVASIVE CV LAB;  Service: Cardiovascular;  Laterality: N/A;  . LUMBAR DISC SURGERY    . PERIPHERAL VASCULAR ATHERECTOMY Left 01/12/2019   Procedure: PERIPHERAL VASCULAR ATHERECTOMY;  Surgeon: Runell Gess, MD;  Location: [facility] INVASIVE CV LAB;  Service: Cardiovascular;  Laterality: Left;  SFA  . TONSILLECTOMY       Current Outpatient Medications  Medication Sig Dispense Refill  . acetaminophen (TYLENOL) 500 MG tablet Take 1,000 mg by mouth every 6 (six) hours as needed for moderate pain.    Marland Kitchen albuterol (PROVENTIL HFA;VENTOLIN HFA) 108 (90 Base) MCG/ACT inhaler Inhale 1-2 puffs into the lungs every 6 (six) hours as needed (wheezing/shortness of breath.).     Marland Kitchen aspirin EC 81 MG tablet Take 1 tablet (81 mg total) by mouth daily.    Marland Kitchen atorvastatin (LIPITOR) 80 MG tablet TAKE 1 TABLET BY MOUTH EVERY DAY AT 6PM (Patient taking differently: Take 80 mg by mouth at bedtime. ) 90 tablet 0  . donepezil (ARICEPT) 5 MG tablet Take 5 mg by mouth at bedtime.   3  . DULoxetine (CYMBALTA) 60 MG capsule Take 60 mg by mouth at bedtime.     Marland Kitchen ipratropium (ATROVENT) 0.06 % nasal spray Place 1 spray into the nose 3 (three) times daily as needed (allergies/congestion.).   6  . isosorbide dinitrate (ISORDIL) 30 MG tablet Take 30 mg by mouth at bedtime.    Marland Kitchen lisinopril (ZESTRIL) 5 MG tablet Take 5 mg by mouth at bedtime.    . Menthol-Methyl Salicylate (MUSCLE RUB) 10-15 % CREA Apply 1 application topically 4 (four) times daily as needed for muscle pain.     . metoprolol succinate (TOPROL-XL) 50 MG 24 hr tablet Take 1 tablet (50 mg total) by mouth daily. 90 tablet 2  . nitroGLYCERIN (NITROSTAT) 0.4 MG SL tablet Place 1 tablet (0.4 mg total) under the tongue every 5 (five) minutes as needed for chest pain. 25 tablet 2  . omeprazole (PRILOSEC) 20 MG capsule Take 20 mg by mouth at bedtime.    . tamsulosin (FLOMAX)  0.4 MG CAPS Take 0.4 mg by mouth at bedtime.     Marland Kitchen apixaban (ELIQUIS) 5 MG TABS tablet Take 1 tablet (5 mg total) by mouth 2 (two) times daily. 60 tablet 6  . clopidogrel (PLAVIX) 75 MG tablet Take 1 tablet (75 mg total) by mouth daily. 90 tablet 3   No current facility-administered medications for this visit.     Allergies:   Contrast media [iodinated diagnostic agents]    Social History:  The patient  reports that he has been smoking cigarettes. He has a 16.25 pack-year smoking history. He has never used smokeless tobacco. He reports that he does not drink alcohol or use drugs.   Family History:  The patient's family history includes Cancer in his daughter, mother, and sister; Diabetes in his brother.    ROS: All other systems are reviewed and negative. Unless otherwise mentioned in H&P  PHYSICAL EXAM: VS:  BP (!) 118/56   Pulse 93   Temp (!) 97.2 F (36.2 C)   Ht 5\' 8"  (1.727 m)   Wt 148 lb 3.2 oz (67.2 kg)   SpO2 97%   BMI 22.53 kg/m  , BMI Body mass index is 22.53 kg/m. GEN: Well nourished, well developed, in no acute distress HEENT: normal Neck: no JVD, carotid bruits, or masses Cardiac: IRRR; no murmurs, rubs, or gallops,no edema  Respiratory:  Clear to auscultation bilaterally, normal work of breathing GI: soft, nontender, nondistended, + BS MS: no deformity or atrophy Skin: warm and dry, no rash Neuro:  Strength and sensation are intact Psych: euthymic mood, full affect   EKG: Sinus rhythm with frequent PACs heart rate 93 bpm.  Incomplete right bundle branch block.  Recent Labs: 12/12/2018: ALT 17 12/27/2018: TSH 2.030 01/12/2019: Magnesium 1.9 01/13/2019: BUN 18; Creatinine, Ser 1.42; Hemoglobin 14.8; Platelets 330; Potassium 4.4; Sodium 141    Lipid Panel    Component Value Date/Time   CHOL 83 (L) 12/12/2018 1135   TRIG 159 (H) 12/12/2018 1135   HDL 34 (L) 12/12/2018 1135   CHOLHDL 2.4 12/12/2018 1135   CHOLHDL 3.0 04/27/2017 0237   VLDL 15  04/27/2017 0237   LDLCALC 22 12/12/2018 1135      Wt Readings from Last 3 Encounters:  02/01/19 148 lb 3.2 oz (67.2 kg)  01/27/19 149 lb (67.6 kg)  01/13/19 145 lb 8.1 oz (66 kg)      Other studies Reviewed: Peripheral Angiography 01/12/2019                1.  Ultrasound-guided right common femoral access               2.  Abdominal aortogram/bilateral iliac angiogram               3.  Contralateral access (second order catheter placement)               4.  Left lower extremity runoff               5.  Placement of a spider distal protection device in the left below the knee popliteal artery               6.  Hawk 1 directional atherectomy followed by drug-coated balloon angioplasty distal left SFA  LHC 04/27/2017   Prox LAD lesion is 40% stenosed.  Ost Cx to Prox Cx lesion is 25% stenosed.  Mid LAD lesion is 50% stenosed.  The left ventricular systolic function is normal.  LV end diastolic pressure is normal.  The left ventricular ejection fraction is 55-65% by visual estimate.  There is no aortic valve stenosis.  Mid Cx lesion is 95% stenosed.  A drug-eluting stent was successfully placed using a STENT SYNERGY DES 3.5X16.  Post intervention, there is a 0% residual stenosis.   Echocardiogram 04/27/2017 Left ventricle: The cavity size was normal. Wall thickness was   normal. Systolic function was normal. The estimated ejection   fraction was in the range of 60% to 65%. Wall motion was normal;   there were no regional wall motion abnormalities. Doppler   parameters are consistent with abnormal left ventricular   relaxation (grade 1 diastolic dysfunction). - Aortic valve: There was no stenosis. - Aorta: Borderline dilated aortic root. Aortic root dimension: 38   mm (ED). - Mitral valve: Mildly calcified annulus. There was no significant   regurgitation. Valve area by pressure  half-time: 2.29 cm^2. - Right ventricle: The cavity size was normal. Systolic function    was normal. - Tricuspid valve: Peak RV-RA gradient (S): 27 mm Hg. - Pulmonary arteries: PA peak pressure: 30 mm Hg (S). - Inferior vena cava: The vessel was normal in size. The   respirophasic diameter changes were in the normal range (>= 50%),   consistent with normal central venous pressure.  ASSESSMENT AND PLAN:  1. Newly Diagnosed Paroxysmal atrial flutter with RVR: I have reviewed his cardiac monitor results with the patient today.  Based on recommendations from Dr. Burt Knack we will discontinue Brilinta.  We will start him on Plavix 75 mg daily (after 1 loading dose of 300 mg), we will begin Eliquis 5 mg twice daily (samples are provided) for CVA protection. CHADS VASC Score 3.  I will increase his metoprolol from 25 mg daily to 50 mg daily for better heart rate control.  We will see him again in 2 weeks to evaluate his response to medications.  2.  Peripheral arterial disease: He is status post peripheral angiography on 01/12/2019 revealing 95% tandem mid left FSA stenosis with three-vessel runoff.  Patient had intervention with atherectomy followed by drug-coated balloon angioplasty with good result.  The patient will be taken off of Brilinta and changed to Plavix as discussed above.  Of note when he was taking Brilinta did cause some shortness of breath.  3.  Coronary artery disease: He denies any chest pain, dyspnea on exertion with the exception of when he is in atrial fib.  He is very active, denies fatigue or change in his stamina.  4.  Hypertension: Blood pressures currently well controlled.  We will reevaluate in a couple weeks once he is at a higher dose of beta-blocker to evaluate for hypotension.  Once his heart rate is better controlled hopefully we will see better numbers.  Current medicines are reviewed at length with the patient today.    Labs/ tests ordered today include: CBC, BMET.  Phill Myron. West Pugh, ANP, AACC   02/01/2019 2:58 PM    [address] Office [phone] Fax [phone]  Notice: This dictation was prepared with Dragon dictation along with smaller phrase technology. Any transcriptional errors that result from this process are unintentional and may not be corrected upon review.

## 2019-01-31 NOTE — Telephone Encounter (Signed)
Late entry 01-30-2019 D/w Dr Claiborne Billings he states to call and schedule an appointment to discuss monitor. Appt scheduled already to discuss monitor with TK.

## 2019-02-01 ENCOUNTER — Encounter: Payer: Self-pay | Admitting: Adult Health

## 2019-02-01 ENCOUNTER — Other Ambulatory Visit: Payer: Self-pay

## 2019-02-01 ENCOUNTER — Ambulatory Visit (INDEPENDENT_AMBULATORY_CARE_PROVIDER_SITE_OTHER): Payer: BC Managed Care – PPO | Admitting: Adult Health

## 2019-02-01 VITALS — BP 118/56 | HR 93 | Temp 97.2°F | Ht 68.0 in | Wt 148.2 lb

## 2019-02-01 DIAGNOSIS — Z79899 Other long term (current) drug therapy: Secondary | ICD-10-CM | POA: Diagnosis not present

## 2019-02-01 DIAGNOSIS — I4892 Unspecified atrial flutter: Secondary | ICD-10-CM | POA: Insufficient documentation

## 2019-02-01 DIAGNOSIS — I480 Paroxysmal atrial fibrillation: Secondary | ICD-10-CM | POA: Diagnosis not present

## 2019-02-01 DIAGNOSIS — I2510 Atherosclerotic heart disease of native coronary artery without angina pectoris: Secondary | ICD-10-CM | POA: Diagnosis not present

## 2019-02-01 MED ORDER — METOPROLOL SUCCINATE ER 50 MG PO TB24: 50.0000 mg | ORAL_TABLET | Freq: Every day | ORAL | 2 refills | Status: DC

## 2019-02-01 MED ORDER — APIXABAN 5 MG PO TABS: 5.0000 mg | ORAL_TABLET | Freq: Two times a day (BID) | ORAL | 6 refills | Status: DC

## 2019-02-01 MED ORDER — CLOPIDOGREL BISULFATE 75 MG PO TABS: 75.0000 mg | ORAL_TABLET | Freq: Every day | ORAL | 3 refills | Status: DC

## 2019-02-01 NOTE — Patient Instructions (Signed)
Medication Instructions:  STOP- Brilinta START- Eliquis 5 mg by mouth twice a day START- Clopidogrel(plavix) 75 mg take 4 tablets(300 mg) by mouth first day and 1 tablets(75 mg) daily thereafter INCREASE- Metoprolol Succinate 50 mg by mouth daily  If you need a refill on your cardiac medications before your next appointment, please call your pharmacy.  Labwork: BMP and CBC HERE IN OUR OFFICE AT [facility]  You will NOT need to fast   If you have labs (blood work) drawn today and your tests are completely normal, you will receive your results only by: Marland Kitchen MyChart Message (if you have MyChart) OR . A paper copy in the mail If you have any lab test that is abnormal or we need to change your treatment, we will call you to review the results.  Testing/Procedures: None Ordered   Follow-Up: Your physician recommends that you schedule a follow-up appointment in: 2 weeks with Jory Sims   At [facility], you and your health needs are our priority.  As part of our continuing mission to provide you with exceptional heart care, we have created designated Provider Care Teams.  These Care Teams include your primary Cardiologist (physician) and Advanced Practice Providers (APPs -  Physician Assistants and Nurse Practitioners) who all work together to provide you with the care you need, when you need it.  Thank you for choosing [facility] at [facility]!!

## 2019-02-02 ENCOUNTER — Telehealth: Payer: Self-pay | Admitting: Adult Health

## 2019-02-02 NOTE — Telephone Encounter (Signed)
He should take 25 mg tablet if he was only taking 12.5 mg tablet of metoprolol.

## 2019-02-02 NOTE — Telephone Encounter (Signed)
Called patient and advised of message from NP- patient will monitor on the 25 mg and call back with any issues.

## 2019-02-02 NOTE — Telephone Encounter (Signed)
Please advise- patient was seen yesterday- and was told to increase Metoprolol to 50 mg, but he has only been taking 12.5 mg of the metoprolol currently and not the entire tablet, should he still increase it that much or only go to the whole tablet.  Will route to NP as she seen him yesterday. Thanks!

## 2019-02-02 NOTE — Telephone Encounter (Signed)
New Message    Pt c/o medication issue:  1. Name of Medication: metoprolol succinate (TOPROL-XL) 50 MG 24 hr tablet   2. How are you currently taking this medication (dosage and times per day)?   3. Are you having a reaction (difficulty breathing--STAT)?   4. What is your medication issue? Patient is calling because he was told to increase his metoprolol to 50mg . He is not sure that the provider was aware that he was really only taking 12.5mg  of the 25mg  tablet (he was halving the dosage)so he wants to be sure that she wants him to increase to 50mg . Please call to discuss

## 2019-02-14 NOTE — Progress Notes (Signed)
Virtual Visit via Telephone Note   This visit type was conducted due to national recommendations for restrictions regarding the COVID-19 Pandemic (e.g. social distancing) in an effort to limit this patient's exposure and mitigate transmission in our community.  Due to his co-morbid illnesses, this patient is at least at moderate risk for complications without adequate follow up.  This format is felt to be most appropriate for this patient at this time.  The patient did not have access to video technology/had technical difficulties with video requiring transitioning to audio format only (telephone).  All issues noted in this document were discussed and addressed.  No physical exam could be performed with this format.  Please refer to the patient's chart for his  consent to telehealth for [facility].   Date:  02/15/2019   ID:  Ralph Dawson, DOB 06/15/1940, MRN [id number]  Patient Location: Home Provider Location: Home  PCP:  Patient, No Pcp Per  Cardiologist:  Nicki Guadalajarahomas Kelly, MD  Electrophysiologist:  None   Evaluation Performed:  Follow-Up Visit  Chief Complaint:  Follow up after medication changes.   History of Present Illness:    Ralph Dawson is a 78 y.o. male we are following for ongoing assessment and management of coronary artery disease, hypertension, and hyperlipidemia.  The patient has a history of an MI in 2019 requiring cardiac catheterization.  Patient had PCI and DES of the AV groove circumflex by Dr. Eldridge DaceVaranasi.    He is also followed by Dr. Allyson SabalBerry for peripheral arterial disease, with peripheral angiography on 01/12/2019 revealing 95% tandem mid left FSA stenosis with three-vessel runoff.  The patient had a Hawk 1 directional atherectomy followed by a drug-coated balloon angioplasty with excellent angiographic result.  Post procedure the patient had some tachyarrhythmias on telemetry which appeared to be PAF versus atrial flutter.  This was evaluated by Dr. Excell Seltzerooper and a  2-week Zio patch monitor was placed for further evaluation.    Cardiac Monitor has alarmed on 02/23/2019 revealing atrial fib with RVR. It has been recommended by Dr. Excell Seltzerooper that if he needs anticoagulation he should be changed to Plavix antiplatelet medication from Brilinta.   On last office visit dated 02/01/2019 the patient was taken off of Brilinta, started on Plavix 75 mg daily (after loading dose of 300 mg), started on Eliquis 5 mg daily with samples provided, for CVA prophylaxis, CHADS VASC Score of 3.  Metoprolol was also increased from 25 mg daily to 50 mg daily for better heart rate control.  He is here on close follow-up to evaluate his response to medications.   Ralph Dawson is tolerating Plavix and is due to start the metoprolol 50 mg tonight as he has just finished his 25 mg metoprolol (he was taking two tablets to equal 50 mg).  His only complaint is feeling his heart racing when he lies on his left side at nighttime.  He states that it does not cause him to be short of breath or have discomfort but he does notice this.  Sometimes makes him feel a little uneasy because he is unsure why it is happening.  He usually falls off to sleep.  By the next morning he does not notice any at all.  He denies any bleeding, excessive bruising, or fatigue.  He states he walks several miles a day (up to 5 miles), without any symptoms of pain in his left leg status post arthrectomy.  He admits to drinking a good bit of caffeine during  the day.  Usually a pot of coffee in the morning with iced tea for meals along with cola drinks.  The patient does not have symptoms concerning for COVID-19 infection (fever, chills, cough, or new shortness of breath).    Past Medical History:  Diagnosis Date  . Arthritis    "hands, feet" (04/27/2017)  . Atrial fibrillation/flutter (HCC)    a. during admission 01/2019 for PAD angiogram - isolated event after allergic reaction/steroids, monitor planned.  . Bradycardia     a. brief episode of nocturnal bradycardia 01/2019 (?NSR with blocked PACs).  Marland Kitchen CAD (coronary artery disease)   . Carotid stenosis 09/24/2011   R ICA patent w/ hx of endarterectomy, stenosis 1-39%;  L ICA patent w/ hx of endarterectomy; external carotids appear patent; see imaging tab for full report  . Chest heaviness 02/25/2009   PVCs, atrial bigeminy  . CKD (chronic kidney disease), stage III   . Claudication (HCC) 06/17/2011   LE doppler - bilateral ABIs normal values at rest; R CIA >50% diameter reduction L CIA 0-49% reduction; bilateral SFAs mild/mod mixed density plaque throughout suggesting 50-69% diameter reduction  . Contrast media allergy   . GERD (gastroesophageal reflux disease)   . Hyperlipidemia   . Hypertension 02/13/2010   echo - EF >55%; mild mitral annular calcification; mild aortic valve sclerosis  . NSTEMI (non-ST elevated myocardial infarction) (HCC) 04/26/2017   [name] 04/27/2017  . PAD (peripheral artery disease) (HCC)    a. s/p LLE intervention on 01/2019.  Marland Kitchen RBBB (right bundle branch block) 02/13/2010   R/P MV - EF 67%; normal perfusion all regions; no significant wall abnormalties noted  . Tobacco abuse    Past Surgical History:  Procedure Laterality Date  . ABDOMINAL AORTOGRAM W/LOWER EXTREMITY Bilateral 01/12/2019   Procedure: ABDOMINAL AORTOGRAM W/LOWER EXTREMITY;  Surgeon: Runell Gess, MD;  Location: [facility] INVASIVE CV LAB;  Service: Cardiovascular;  Laterality: Bilateral;  . APPENDECTOMY    . ARTERIAL BYPASS SURGRY     pt unaware of this OR on 04/27/2017  . BACK SURGERY    . CAROTID ENDARTERECTOMY Right 07/15/2009   Right CEA  . CAROTID ENDARTERECTOMY Left 12/13/2007   Left CEA  . CORONARY ANGIOPLASTY WITH STENT PLACEMENT  04/27/2017  . CORONARY STENT INTERVENTION N/A 04/27/2017   Procedure: CORONARY STENT INTERVENTION;  Surgeon: Corky Crafts, MD;  Location: [facility] INVASIVE CV LAB;  Service: Cardiovascular;  Laterality: N/A;  . LAPAROSCOPIC  CHOLECYSTECTOMY    . LEFT HEART CATH AND CORONARY ANGIOGRAPHY N/A 04/27/2017   Procedure: LEFT HEART CATH AND CORONARY ANGIOGRAPHY;  Surgeon: Corky Crafts, MD;  Location: [facility] INVASIVE CV LAB;  Service: Cardiovascular;  Laterality: N/A;  . LUMBAR DISC SURGERY    . PERIPHERAL VASCULAR ATHERECTOMY Left 01/12/2019   Procedure: PERIPHERAL VASCULAR ATHERECTOMY;  Surgeon: Runell Gess, MD;  Location: [facility] INVASIVE CV LAB;  Service: Cardiovascular;  Laterality: Left;  SFA  . TONSILLECTOMY       Current Meds  Medication Sig  . acetaminophen (TYLENOL) 500 MG tablet Take 1,000 mg by mouth every 6 (six) hours as needed for moderate pain.  Marland Kitchen albuterol (PROVENTIL HFA;VENTOLIN HFA) 108 (90 Base) MCG/ACT inhaler Inhale 1-2 puffs into the lungs every 6 (six) hours as needed (wheezing/shortness of breath.).   Marland Kitchen apixaban (ELIQUIS) 5 MG TABS tablet Take 1 tablet (5 mg total) by mouth 2 (two) times daily.  Marland Kitchen aspirin EC 81 MG tablet Take 1 tablet (81 mg total) by mouth daily.  Marland Kitchen  atorvastatin (LIPITOR) 80 MG tablet TAKE 1 TABLET BY MOUTH EVERY DAY AT 6PM (Patient taking differently: Take 80 mg by mouth at bedtime. )  . clopidogrel (PLAVIX) 75 MG tablet Take 1 tablet (75 mg total) by mouth daily.  Marland Kitchen donepezil (ARICEPT) 5 MG tablet Take 5 mg by mouth at bedtime.   . DULoxetine (CYMBALTA) 60 MG capsule Take 60 mg by mouth at bedtime.   Marland Kitchen ipratropium (ATROVENT) 0.06 % nasal spray Place 1 spray into the nose 3 (three) times daily as needed (allergies/congestion.).   Marland Kitchen isosorbide dinitrate (ISORDIL) 30 MG tablet Take 30 mg by mouth at bedtime.  Marland Kitchen lisinopril (ZESTRIL) 5 MG tablet Take 5 mg by mouth at bedtime.  . Menthol-Methyl Salicylate (MUSCLE RUB) 10-15 % CREA Apply 1 application topically 4 (four) times daily as needed for muscle pain.   . metoprolol succinate (TOPROL-XL) 50 MG 24 hr tablet Take 1 tablet (50 mg total) by mouth daily.  . nitroGLYCERIN (NITROSTAT) 0.4 MG SL tablet Place 1 tablet (0.4 mg total)  under the tongue every 5 (five) minutes as needed for chest pain.  Marland Kitchen omeprazole (PRILOSEC) 20 MG capsule Take 20 mg by mouth at bedtime.  . tamsulosin (FLOMAX) 0.4 MG CAPS Take 0.4 mg by mouth at bedtime.      Allergies:   Contrast media [iodinated diagnostic agents]   Social History   Tobacco Use  . Smoking status: Current Some Day Smoker    Packs/day: 0.25    Years: 65.00    Pack years: 16.25    Types: Cigarettes    Last attempt to quit: 06/14/2017    Years since quitting: 1.6  . Smokeless tobacco: Never Used  . Tobacco comment: smoking a 10 cigarettes a week   Substance Use Topics  . Alcohol use: No  . Drug use: No     Family Hx: The patient's family history includes Cancer in his daughter, mother, and sister; Diabetes in his brother.  ROS:   Please see the history of present illness.   All other systems reviewed and are negative.   Prior CV studies:   The following studies were reviewed today: Peripheral Angiography 01/12/2019  1. Ultrasound-guided right common femoral access 2. Abdominal aortogram/bilateral iliac angiogram 3. Contralateral access (second order catheter placement) 4. Left lower extremity runoff 5. Placement of a spider distal protection device in the left below the knee popliteal artery 6. Hawk 1 directional atherectomy followed by drug-coated balloon angioplasty distal left SFA  LHC 04/27/2017   Prox LAD lesion is 40% stenosed.  Ost Cx to Prox Cx lesion is 25% stenosed.  Mid LAD lesion is 50% stenosed.  The left ventricular systolic function is normal.  LV end diastolic pressure is normal.  The left ventricular ejection fraction is 55-65% by visual estimate.  There is no aortic valve stenosis.  Mid Cx lesion is 95% stenosed.  A drug-eluting stent was successfully placed using a STENT SYNERGY DES 3.5X16.  Post intervention, there is a 0% residual  stenosis.  Echocardiogram 04/27/2017 Left ventricle: The cavity size was normal. Wall thickness was normal. Systolic function was normal. The estimated ejection fraction was in the range of 60% to 65%. Wall motion was normal; there were no regional wall motion abnormalities. Doppler parameters are consistent with abnormal left ventricular relaxation (grade 1 diastolic dysfunction). - Aortic valve: There was no stenosis. - Aorta: Borderline dilated aortic root. Aortic root dimension: 38 mm (ED). - Mitral valve: Mildly calcified annulus. There was no significant  regurgitation. Valve area by pressure half-time: 2.29 cm^2. - Right ventricle: The cavity size was normal. Systolic function was normal. - Tricuspid valve: Peak RV-RA gradient (S): 27 mm Hg. - Pulmonary arteries: PA peak pressure: 30 mm Hg (S). - Inferior vena cava: The vessel was normal in size. The respirophasic diameter changes were in the normal range (>= 50%), consistent with normal central venous pressure.  Labs/Other Tests and Data Reviewed:    EKG:  No ECG reviewed.  Recent Labs: 12/12/2018: ALT 17 12/27/2018: TSH 2.030 01/12/2019: Magnesium 1.9 01/13/2019: BUN 18; Creatinine, Ser 1.42; Hemoglobin 14.8; Platelets 330; Potassium 4.4; Sodium 141   Recent Lipid Panel Lab Results  Component Value Date/Time   CHOL 83 (L) 12/12/2018 11:35 AM   TRIG 159 (H) 12/12/2018 11:35 AM   HDL 34 (L) 12/12/2018 11:35 AM   CHOLHDL 2.4 12/12/2018 11:35 AM   CHOLHDL 3.0 04/27/2017 02:37 AM   LDLCALC 22 12/12/2018 11:35 AM    Wt Readings from Last 3 Encounters:  02/15/19 148 lb (67.1 kg)  02/01/19 148 lb 3.2 oz (67.2 kg)  01/27/19 149 lb (67.6 kg)     Objective:    Vital Signs:  Ht  (1.727 m)   Wt 148 lb (67.1 kg)   BMI 22.50 kg/m    VITAL SIGNS:  reviewed GEN:  no acute distress NEURO:  alert and oriented x 3, no obvious focal deficit PSYCH:  normal affect  ASSESSMENT & PLAN:    1.   Coronary artery disease: Drug-eluting stent to daily AV groove circumflex on 04/27/2017.  He is now on Plavix, but not on aspirin as he is also on Eliquis for atrial fib.  He denies any chest pain, dyspnea on exertion, or fatigue.  He is medically compliant.  He has noticed some heart racing when he lays down at night to sleep mostly when he lays on his left side.  It does worry him a little but he has no associated symptoms, and usually falls off to sleep.  I have asked him to decrease his caffeine intake in the afternoon and before bed which may be helpful.  He is also beginning metoprolol 50 mg XL at at bedtime which may be helpful for this instead of the twice daily metoprolol tartrate.  He will follow-up with Dr. Tresa Endo on previously scheduled appointment.  2.  Paroxysmal atrial fibrillation: This was noted on cardiac monitor which was placed after he had PAD intervention on 01/12/2019.  He was taken off of Brilinta, started on Plavix and Eliquis 5 mg twice daily was added.  Other than feeling some nighttime palpitations he is not aware of any heart racing or irregularity. CHADS VASC Score of 4.  Continue current regimen as he is asymptomatic for bleeding or  Bruising.  3.  Peripheral arterial disease: Status post peripheral angiography on 01/12/2019 revealing a 95% tandem mid left FSA stenosis with three-vessel runoff.  A Hawk 1 directional atherectomy followed by a drug-coated balloon angioplasty as intervention.  He states he is walking several miles a day and has no further complaints of left leg pain.  He states he feels great.  4.  Hypertension: Blood pressure has been well controlled at home.  He is to continue his current regimen of isosorbide, metoprolol, and lisinopril.  Would recommend follow-up BMET and CBC on visit with Dr. Tresa Endo for ongoing evaluation of kidney function and to check for anemia as he is on both Plavix and Eliquis.  5. Hyperlipidemia: Remains on  atorvastatin 80 mg at at  bedtime.  Last check of LDL in our system was on 04/27/2017 and was found to be 66.  Would recommend follow-up lipids and LFTs on next office visit in 1 week unless completed by primary care.   COVID-19 Education: The signs and symptoms of COVID-19 were discussed with the patient and how to seek care for testing (follow up with PCP or arrange E-visit).  The importance of social distancing was discussed today.  Time:   Today, I have spent 15 minutes with the patient with telehealth technology discussing the above problems.     Medication Adjustments/Labs and Tests Ordered: Current medicines are reviewed at length with the patient today.  Concerns regarding medicines are outlined above.   Tests Ordered: No orders of the defined types were placed in this encounter.   Medication Changes: No orders of the defined types were placed in this encounter.   Disposition:  Follow up previously scheduled appointment with Dr. Claiborne Billings on Nov 16 th, 2020  Marland Kitchen. West Pugh, ANP, AACC  02/15/2019 10:09 AM    [facility]

## 2019-02-15 ENCOUNTER — Encounter: Payer: Self-pay | Admitting: Adult Health

## 2019-02-15 ENCOUNTER — Telehealth (INDEPENDENT_AMBULATORY_CARE_PROVIDER_SITE_OTHER): Payer: BC Managed Care – PPO | Admitting: Adult Health

## 2019-02-15 VITALS — Ht 68.0 in | Wt 148.0 lb

## 2019-02-15 DIAGNOSIS — I2510 Atherosclerotic heart disease of native coronary artery without angina pectoris: Secondary | ICD-10-CM

## 2019-02-15 DIAGNOSIS — E785 Hyperlipidemia, unspecified: Secondary | ICD-10-CM

## 2019-02-15 DIAGNOSIS — I10 Essential (primary) hypertension: Secondary | ICD-10-CM

## 2019-02-15 DIAGNOSIS — I480 Paroxysmal atrial fibrillation: Secondary | ICD-10-CM

## 2019-02-15 DIAGNOSIS — I739 Peripheral vascular disease, unspecified: Secondary | ICD-10-CM

## 2019-02-15 NOTE — Patient Instructions (Signed)
Medication Instructions:  Continue current medications  *If you need a refill on your cardiac medications before your next appointment, please call your pharmacy*  Lab Work: None Ordered  Testing/Procedures: None Ordered  Follow-Up: At [facility], you and your health needs are our priority.  As part of our continuing mission to provide you with exceptional heart care, we have created designated Provider Care Teams.  These Care Teams include your primary Cardiologist (physician) and Advanced Practice Providers (APPs -  Physician Assistants and Nurse Practitioners) who all work together to provide you with the care you need, when you need it.  Your next appointment:   Keep office visit appointment with Dr Claiborne Billings on November 16th @ 4:00 pm

## 2019-02-15 NOTE — Addendum Note (Signed)
Addended by: Vennie Homans on: 02/15/2019 10:29 AM   Modules accepted: Orders

## 2019-02-22 DIAGNOSIS — I739 Peripheral vascular disease, unspecified: Secondary | ICD-10-CM | POA: Diagnosis not present

## 2019-02-22 DIAGNOSIS — I4891 Unspecified atrial fibrillation: Secondary | ICD-10-CM | POA: Diagnosis not present

## 2019-02-22 DIAGNOSIS — I480 Paroxysmal atrial fibrillation: Secondary | ICD-10-CM | POA: Diagnosis not present

## 2019-02-22 DIAGNOSIS — M5136 Other intervertebral disc degeneration, lumbar region: Secondary | ICD-10-CM | POA: Diagnosis not present

## 2019-02-22 DIAGNOSIS — I10 Essential (primary) hypertension: Secondary | ICD-10-CM | POA: Diagnosis not present

## 2019-02-24 ENCOUNTER — Other Ambulatory Visit: Payer: Self-pay | Admitting: Adult Health

## 2019-02-24 DIAGNOSIS — I480 Paroxysmal atrial fibrillation: Secondary | ICD-10-CM | POA: Diagnosis not present

## 2019-02-24 DIAGNOSIS — Z79899 Other long term (current) drug therapy: Secondary | ICD-10-CM | POA: Diagnosis not present

## 2019-02-24 LAB — CBC
Hematocrit: 39.2 % (ref 37.5–51.0)
Hemoglobin: 13.6 g/dL (ref 13.0–17.7)
MCH: 32.4 pg (ref 26.6–33.0)
MCHC: 34.7 g/dL (ref 31.5–35.7)
MCV: 93 fL (ref 79–97)
Platelets: 237 10*3/uL (ref 150–450)
RBC: 4.2 x10E6/uL (ref 4.14–5.80)
RDW: 11.9 % (ref 11.6–15.4)
WBC: 9.4 10*3/uL (ref 3.4–10.8)

## 2019-02-24 LAB — BASIC METABOLIC PANEL
BUN/Creatinine Ratio: 10 (ref 10–24)
BUN: 14 mg/dL (ref 8–27)
CO2: 23 mmol/L (ref 20–29)
Calcium: 8.9 mg/dL (ref 8.6–10.2)
Chloride: 105 mmol/L (ref 96–106)
Creatinine, Ser: 1.46 mg/dL — ABNORMAL HIGH (ref 0.76–1.27)
GFR calc Af Amer: 52 mL/min/{1.73_m2} — ABNORMAL LOW (ref >59)
GFR calc non Af Amer: 45 mL/min/{1.73_m2} — ABNORMAL LOW (ref >59)
Glucose: 87 mg/dL (ref 65–99)
Potassium: 4.5 mmol/L (ref 3.5–5.2)
Sodium: 142 mmol/L (ref 134–144)

## 2019-02-25 ENCOUNTER — Telehealth: Payer: Self-pay | Admitting: Physician Assistant

## 2019-02-25 NOTE — Telephone Encounter (Signed)
78 y.o. male called in with symptoms of hand and foot pain and foot burning as well as abdominal discomfort since he started on Plavix and Toprol XL.  He denies swelling, increased shortness of breath, chest pain.  He has not had an pallor in his extremities or wounds.  He has not had a rash or ecchymosis.  He has an appt with Dr. Claiborne Billings in 2 days. PLAN:  1. Change Toprol to 25 mg once daily and take at bedtime instead of the AM. 2. Stay on Plavix 75 mg once daily. 3. Keep appt with Dr. Claiborne Billings 02/27/2019. Richardson Dopp, PA-C    02/25/2019 9:31 AM

## 2019-02-27 ENCOUNTER — Ambulatory Visit (INDEPENDENT_AMBULATORY_CARE_PROVIDER_SITE_OTHER): Payer: BC Managed Care – PPO | Admitting: Cardiovascular Disease

## 2019-02-27 ENCOUNTER — Other Ambulatory Visit: Payer: Self-pay

## 2019-02-27 ENCOUNTER — Encounter: Payer: Self-pay | Admitting: Cardiovascular Disease

## 2019-02-27 DIAGNOSIS — I739 Peripheral vascular disease, unspecified: Secondary | ICD-10-CM | POA: Diagnosis not present

## 2019-02-27 DIAGNOSIS — I2510 Atherosclerotic heart disease of native coronary artery without angina pectoris: Secondary | ICD-10-CM

## 2019-02-27 DIAGNOSIS — I10 Essential (primary) hypertension: Secondary | ICD-10-CM

## 2019-02-27 DIAGNOSIS — Z720 Tobacco use: Secondary | ICD-10-CM

## 2019-02-27 DIAGNOSIS — I480 Paroxysmal atrial fibrillation: Secondary | ICD-10-CM

## 2019-02-27 DIAGNOSIS — I4510 Unspecified right bundle-branch block: Secondary | ICD-10-CM

## 2019-02-27 DIAGNOSIS — E785 Hyperlipidemia, unspecified: Secondary | ICD-10-CM

## 2019-02-27 MED ORDER — METOPROLOL SUCCINATE ER 50 MG PO TB24: 25.0000 mg | ORAL_TABLET | Freq: Every day | ORAL | 9 refills | Status: DC

## 2019-02-27 NOTE — Progress Notes (Signed)
Visit   Cardiology Office Note    Date:  03/05/2019   ID:  Ralph Dawson, DOB Jul 29, 1940, MRN [id number]  PCP:  Patient, No Pcp Per  Cardiologist:  Shelva Majestic, MD   No chief complaint on file.   History of Present Illness:  Ralph Dawson is a 78 y.o. male who has PVD and underwent left carotid endarterectomy in September 2009 and in April 2011 underwent right carotid endarterectomy. He has a history of lower extremity intermittent claudication, hyperlipidemia, as well as mild valvular heart disease with mild mitral annular calcification with mild MR, mild TR, and mild aortic valve sclerosis with normal systolic and diastolic function. A nuclear perfusion study November 2011 showed normal perfusion. He has had GERD symptoms which have improved with pantoprazole. He was started on pravastatin 80 mg by Dr. Edrick Oh and last year his LDL cholesterol was 65. He does have documented right bundle branch block.   When I saw him he deniedany episodes of chest pain applications.. There is a long-standing tobacco history and continues to smoke, now only 8 cigarettes per day. He has noticed some mild shortness of breath, particularly with significant activity. He has a history of GERD for which he takes protonix. He has been taking Cymbalta for depression.  In November 2015 an echo Doppler study showed mild left ventricular hypertrophy with hyperdynamic LV function and ejection fraction of 65-70%. There was grade 1 diastolic dysfunction.  Unfortunately he continues to smoke and has been smoking for 70 years. He was seen by Almyra Deforest in January 2018.In January15 2019 he underwent cardiac catheterization byDr. Marlana Salvage to have a 95% proximal circumflex stenosis which required PCI and use of a Guideliner to get the stent around the proximal bend in the circumflex vessel. Echo Doppler study revealed an EF of 60-65%. There were no significant valvular abnormalities.   Since I last saw  him in April 2019, he was evaluated by Ralph Dawson in October 2019.  At that time he was without complaints from a cardiac standpoint.  He had been treated for pneumonia by his primary physician.  Unfortunately he was still smoking cigarettes.  He did note occasional shortness of breath when taking Brilinta but otherwise remained stable.    He was last evaluated by me in a telemedicine visit on December 06, 2018.  He continues to smoke cigarettes and at that time denied any chest pain or palpitations.  He had experienced some left calf discomfort with walking.  He admitted to shortness of breath with activity.  Since his evaluation, he was evaluated by Dr. Gwenlyn Found on December 27, 2018 after undergoing Dopplers which I ordered on December 20, 2018  that revealed a left ABI of 0.52 with high-frequency signals in his left common femoral and distal left SFA.  He underwent peripheral angiography in January 12, 2019 demonstrating 95% tandem mid left SFA stenosis with three-vessel runoff.  He had a [place] 1 directional atherectomy followed by drug coated balloon angioplasty with an excellent angiographic result.  Post procedure the patient had some tachyarrhythmias on telemetry which appeared to be PAF versus atrial flutter.  He wore a 2-week Zio patch monitor for further evaluation.  His cardiac monitor revealed atrial fibrillation with RVR and as result since he was in need for anticoagulation, his antiplatelet therapy was changed to Plavix from Brilinta and he was started on apixaban.  He was told to increase his metoprolol dose to 50 mg but apparently did not tolerate this and therefore  is only been taking 25 mg daily.  He presents for reevaluation.  Past Medical History:  Diagnosis Date  . Arthritis    "hands, feet" (04/27/2017)  . Atrial fibrillation/flutter ([place])    a. during admission 01/2019 for PAD angiogram - isolated event after allergic reaction/steroids, monitor planned.  . Bradycardia    a. brief  episode of nocturnal bradycardia 01/2019 (?NSR with blocked PACs).  Marland Kitchen CAD (coronary artery disease)   . Carotid stenosis 09/24/2011   R ICA patent w/ hx of endarterectomy, stenosis 1-39%;  L ICA patent w/ hx of endarterectomy; external carotids appear patent; see imaging tab for full report  . Chest heaviness 02/25/2009   PVCs, atrial bigeminy  . CKD (chronic kidney disease), stage III   . Claudication ([place]) 06/17/2011   LE doppler - bilateral ABIs normal values at rest; R CIA >50% diameter reduction L CIA 0-49% reduction; bilateral SFAs mild/mod mixed density plaque throughout suggesting 50-69% diameter reduction  . Contrast media allergy   . GERD (gastroesophageal reflux disease)   . Hyperlipidemia   . Hypertension 02/13/2010   echo - EF >55%; mild mitral annular calcification; mild aortic valve sclerosis  . NSTEMI (non-ST elevated myocardial infarction) ([place]) 04/26/2017   Ralph Dawson 04/27/2017  . PAD (peripheral artery disease) ([place])    a. s/p LLE intervention on 01/2019.  Marland Kitchen RBBB (right bundle branch block) 02/13/2010   R/P MV - EF 67%; normal perfusion all regions; no significant wall abnormalties noted  . Tobacco abuse     Past Surgical History:  Procedure Laterality Date  . ABDOMINAL AORTOGRAM W/LOWER EXTREMITY Bilateral 01/12/2019   Procedure: ABDOMINAL AORTOGRAM W/LOWER EXTREMITY;  Surgeon: Lorretta Harp, MD;  Location: [facility];  Service: Cardiovascular;  Laterality: Bilateral;  . APPENDECTOMY    . ARTERIAL BYPASS SURGRY     pt unaware of this OR on 04/27/2017  . BACK SURGERY    . CAROTID ENDARTERECTOMY Right 07/15/2009   Right CEA  . CAROTID ENDARTERECTOMY Left 12/13/2007   Left CEA  . CORONARY ANGIOPLASTY WITH STENT PLACEMENT  04/27/2017  . CORONARY STENT INTERVENTION N/A 04/27/2017   Procedure: CORONARY STENT INTERVENTION;  Surgeon: Jettie Booze, MD;  Location: [facility];  Service: Cardiovascular;  Laterality: N/A;  . LAPAROSCOPIC CHOLECYSTECTOMY     . LEFT HEART CATH AND CORONARY ANGIOGRAPHY N/A 04/27/2017   Procedure: LEFT HEART CATH AND CORONARY ANGIOGRAPHY;  Surgeon: Jettie Booze, MD;  Location: [facility];  Service: Cardiovascular;  Laterality: N/A;  . LUMBAR [place]    . PERIPHERAL VASCULAR ATHERECTOMY Left 01/12/2019   Procedure: PERIPHERAL VASCULAR ATHERECTOMY;  Surgeon: Lorretta Harp, MD;  Location: [facility];  Service: Cardiovascular;  Laterality: Left;  SFA  . TONSILLECTOMY      Current Medications: Outpatient Medications Prior to Visit  Medication Sig Dispense Refill  . acetaminophen (TYLENOL) 500 MG tablet Take 1,000 mg by mouth every 6 (six) hours as needed for moderate pain.    Marland Kitchen albuterol (PROVENTIL HFA;VENTOLIN HFA) 108 (90 Base) MCG/ACT inhaler Inhale 1-2 puffs into the lungs every 6 (six) hours as needed (wheezing/shortness of breath.).     Marland Kitchen apixaban (ELIQUIS) 5 MG TABS tablet Take 1 tablet (5 mg total) by mouth 2 (two) times daily. 60 tablet 6  . atorvastatin (LIPITOR) 80 MG tablet TAKE 1 TABLET BY MOUTH EVERY DAY AT 6PM (Patient taking differently: Take 80 mg by mouth at bedtime. ) 90 tablet 0  . clopidogrel (  PLAVIX) 75 MG tablet Take 1 tablet (75 mg total) by mouth daily. 90 tablet 3  . donepezil (ARICEPT) 5 MG tablet Take 5 mg by mouth at bedtime.   3  . DULoxetine (CYMBALTA) 60 MG capsule Take 60 mg by mouth at bedtime.     Marland Kitchen ipratropium (ATROVENT) 0.06 % nasal spray Place 1 spray into the nose 3 (three) times daily as needed (allergies/congestion.).   6  . isosorbide dinitrate (ISORDIL) 30 MG tablet Take 30 mg by mouth at bedtime.    Marland Kitchen lisinopril (ZESTRIL) 5 MG tablet Take 5 mg by mouth at bedtime.    . Menthol-Methyl Salicylate (MUSCLE RUB) 10-15 % CREA Apply 1 application topically 4 (four) times daily as needed for muscle pain.     . nitroGLYCERIN (NITROSTAT) 0.4 MG SL tablet Place 1 tablet (0.4 mg total) under the tongue every 5 (five) minutes as needed for chest pain. 25 tablet  2  . omeprazole (PRILOSEC) 20 MG capsule Take 20 mg by mouth at bedtime.    . tamsulosin (FLOMAX) 0.4 MG CAPS Take 0.4 mg by mouth at bedtime.     . metoprolol succinate (TOPROL-XL) 50 MG 24 hr tablet Take 1 tablet (50 mg total) by mouth daily. 90 tablet 2   No facility-administered medications prior to visit.      Allergies:   Contrast media [iodinated diagnostic agents]   Social History   Socioeconomic History  . Marital status: Married    Spouse name: Not on file  . Number of children: Not on file  . Years of education: Not on file  . Highest education level: Not on file  Occupational History  . Not on file  Social Needs  . Financial resource strain: Not on file  . Food insecurity    Worry: Not on file    Inability: Not on file  . Transportation needs    Medical: Not on file    Non-medical: Not on file  Tobacco Use  . Smoking status: Current Some Day Smoker    Packs/day: 0.25    Years: 65.00    Pack years: 16.25    Types: Cigarettes    Last attempt to quit: 06/14/2017    Years since quitting: 1.7  . Smokeless tobacco: Never Used  . Tobacco comment: smoking a 10 cigarettes a week   Substance and Sexual Activity  . Alcohol use: No  . Drug use: No  . Sexual activity: Yes  Lifestyle  . Physical activity    Days per week: Not on file    Minutes per session: Not on file  . Stress: Not on file  Relationships  . Social Herbalist on phone: Not on file    Gets together: Not on file    Attends religious service: Not on file    Active member of club or organization: Not on file    Attends meetings of clubs or organizations: Not on file    Relationship status: Not on file  Other Topics Concern  . Not on file  Social History Narrative  . Not on file     Family History:  The patient's family history includes Cancer in his daughter, mother, and sister; Diabetes in his brother.   ROS General: Negative; No fevers, chills, or night sweats;  HEENT: Negative;  No changes in vision or hearing, sinus congestion, difficulty swallowing Pulmonary: Negative; No cough, wheezing, shortness of breath, hemoptysis Cardiovascular: See HPI GI: Negative; No nausea, vomiting, diarrhea,  or abdominal pain GU: Negative; No dysuria, hematuria, or difficulty voiding Musculoskeletal: Negative; no myalgias, joint pain, or weakness Hematologic/Oncology: Negative; no easy bruising, bleeding Endocrine: Negative; no heat/cold intolerance; no diabetes Neuro: Negative; no changes in balance, headaches Skin: Negative; No rashes or skin lesions Psychiatric: Negative; No behavioral problems, depression Sleep: Negative; No snoring, daytime sleepiness, hypersomnolence, bruxism, restless legs, hypnogognic hallucinations, no cataplexy Other comprehensive 14 point system review is negative.   PHYSICAL EXAM:   VS:  BP (!) 119/53   Pulse 65   Temp (!) 97 F (36.1 C)   Ht _0  (1.727 m)   Wt 157 lb 9.6 oz (71.5 kg)   SpO2 97%   BMI 23.96 kg/m     Repeat blood pressure by me 140/70; he states his blood pressure at home typically runs around 120/60  Wt Readings from Last 3 Encounters:  02/27/19 157 lb 9.6 oz (71.5 kg)  02/15/19 148 lb (67.1 kg)  02/01/19 148 lb 3.2 oz (67.2 kg)    General: Alert, oriented, no distress.  Skin: normal turgor, no rashes, warm and dry HEENT: Normocephalic, atraumatic. Pupils equal round and reactive to light; sclera anicteric; extraocular muscles intact;  Nose without nasal septal hypertrophy Mouth/Parynx benign; Mallinpatti scale 2 Neck: No JVD, no carotid bruits; normal carotid upstroke Lungs: clear to ausculatation and percussion; no wheezing or rales Chest wall: without tenderness to palpitation Heart: PMI not displaced, RRR, s1 s2 normal, 1/6 systolic murmur, no diastolic murmur, no rubs, gallops, thrills, or heaves Abdomen: soft, nontender; no hepatosplenomehaly, BS+; abdominal aorta nontender and not dilated by palpation. Back: no  CVA tenderness Pulses 2+ Musculoskeletal: full range of motion, normal strength, no joint deformities Extremities: no clubbing cyanosis or edema, Homan's sign negative  Neurologic: grossly nonfocal; Cranial nerves grossly wnl Psychologic: Normal mood and affect   Studies/Labs Reviewed:   ECG (independently read by me): Sinus rhythm with PACs at 65 bpm, right bundle branch block with repolarization changes  February 01, 2019 ECG (independently read by me): Sinus rhythm at 93 bpm with PACs, incomplete right bundle branch block  April 2019 ECG (independently read by me): Normal sinus rhythm at 85 bpm.  Right bundle branch block with repolarization changes.  April 2016 ECG (independently read by me): Sinus rhythm at 67 bpm with occasional PVCs with transient trigeminal rhythm; right bundle branch block.  October 2015 ECG (independently read by me):Normal sinus rhythm at 70 beats per minute.  Right bundle branch block with repolarization changes.  Normal intervals.  Prior ECG: Sinus rhythm with right bundle branch block at 67 beats per minute  Recent Labs: BMP Latest Ref Rng & Units 02/24/2019 01/13/2019 01/12/2019  Glucose 65 - 99 mg/dL 87 139(H) 249(H)  BUN 8 - 27 mg/dL _1 Creatinine 0.76 - 1.27 mg/dL 1.46(H) 1.42(H) 1.44(H)  BUN/Creat Ratio 10 - 24 10 - -  Sodium 134 - 144 mmol/L 142 141 141  Potassium 3.5 - 5.2 mmol/L 4.5 4.4 3.5  Chloride 96 - 106 mmol/L 105 112(H) 110  CO2 20 - 29 mmol/L 23 19(L) 20(L)  Calcium 8.6 - 10.2 mg/dL 8.9 8.6(L) 8.5(L)     Hepatic Function Latest Ref Rng & Units 12/12/2018 05/05/2017 04/28/2017  Total Protein 6.0 - 8.5 g/dL 6.8 6.4(L) 6.5  Albumin 3.7 - 4.7 g/dL 4.2 3.7 3.5  AST 0 - 40 IU/L _2 ALT 0 - 44 IU/L 17 19 15(L)  Alk Phosphatase 39 - 117 IU/L 85 97 72  Total Bilirubin 0.0 - 1.2 mg/dL 0.7 1.1 1.5(H)  Bilirubin, Direct 0.1 - 0.5 mg/dL - - 0.2    CBC Latest Ref Rng & Units 02/24/2019 01/13/2019 12/27/2018  WBC 3.4 - 10.8  x10E3/uL 9.4 24.8(H) 8.6  Hemoglobin 13.0 - 17.7 g/dL 13.6 14.8 14.0  Hematocrit 37.5 - 51.0 % 39.2 44.6 39.6  Platelets 150 - 450 x10E3/uL 237 330 223   Lab Results  Component Value Date   MCV 93 02/24/2019   MCV 96.5 01/13/2019   MCV 91 12/27/2018   Lab Results  Component Value Date   TSH 2.030 12/27/2018   No results found for: HGBA1C   BNP No results found for: BNP  ProBNP No results found for: PROBNP   Lipid Panel     Component Value Date/Time   CHOL 83 (L) 12/12/2018 1135   TRIG 159 (H) 12/12/2018 1135   HDL 34 (L) 12/12/2018 1135   CHOLHDL 2.4 12/12/2018 1135   CHOLHDL 3.0 04/27/2017 0237   VLDL 15 04/27/2017 0237   LDLCALC 22 12/12/2018 1135   LABVLDL 27 12/12/2018 1135     RADIOLOGY: No results found.   Additional studies/ records that were reviewed today include:  Echocardiogram 04/27/2017 Study Conclusions  - Left ventricle: The cavity size was normal. Wall thickness was normal. Systolic function was normal. The estimated ejection fraction was in the range of 60% to 65%. Wall motion was normal; there were no regional wall motion abnormalities. Doppler parameters are consistent with abnormal left ventricular relaxation (grade 1 diastolic dysfunction). - Aortic valve: There was no stenosis. - Aorta: Borderline dilated aortic root. Aortic root dimension: 38 mm (ED). - Mitral valve: Mildly calcified annulus. There was no significant regurgitation. Valve area by pressure half-time: 2.29 cm^2. - Right ventricle: The cavity size was normal. Systolic function was normal. - Tricuspid valve: Peak RV-RA gradient (S): 27 mm Hg. - Pulmonary arteries: PA peak pressure: 30 mm Hg (S). - Inferior vena cava: The vessel was normal in size. The respirophasic diameter changes were in the normal range (>= 50%), consistent with normal central venous pressure.  Impressions:  - Normal LV size with EF 60-65%. Normal RV size and systolic  function. No significant valvular abnormalities.  Cardiac cath 04/27/2017 Conclusion     Prox LAD lesion is 40% stenosed.  Ost Cx to Prox Cx lesion is 25% stenosed.  Mid LAD lesion is 50% stenosed.  The left ventricular systolic function is normal.  LV end diastolic pressure is normal.  The left ventricular ejection fraction is 55-65% by visual estimate.  There is no aortic valve stenosis.  Mid Cx lesion is 95% stenosed.  A drug-eluting stent was successfully placed using a STENT SYNERGY DES 3.5X16.  Post intervention, there is a 0% residual stenosis.  Culprit circumflex lesion stented. Double wire was needed to get balloon around proximal bend. Guideliner was needed to get the stent around the proximal bend. Would use EBU 3.5 Guide if cath needed in the future as EBU 3 did not provide great support.   COntinue DAPT for 1 year along with aggressive secondary prevention including smoking cessation.      ASSESSMENT:    1. Coronary artery disease involving native coronary artery of native heart without angina pectoris   2. Peripheral arterial disease ([place])   3. PAF (paroxysmal atrial fibrillation) ([place])   4. Essential hypertension   5. Hyperlipidemia with target LDL less than 70   6. Tobacco abuse   7. RBBB  PLAN:  Mr. Jaymin Waln is a 78 year old gentleman who has significant coronary as well as peripheral vascular disease and is still smoking 1 pack/day and has been so smoking for 68 years.  He has no intention to quit.  When I evaluated him in a telemedicine visit in August 2020 he had progressive claudication symptoms and subsequently was found to have high-grade 95% tandem mid left FSA stenoses with three-vessel runoff and underwent successful [facility] 1 directional atherectomy followed by a drug-coated balloon angioplasty by Dr. Gwenlyn Found on January 12, 2019.  During his postoperative course he was noted to have episodes of tachycardia suggesting AF with  RVR.  A subsequent 10-day Zio patch monitor confirmed atrial fibrillation with several episodes of RVR with heart rates at 167 up to 178 bpm.  Apparently he was advised to increase metoprolol therapy but he states he could not tolerate this.  Presently he has just been taking metoprolol succinate 25 mg daily.  He is no longer on Brilinta and is now on Eliquis 5 mg twice a day and Plavix.  Blood pressure today is stable and he also takes a Sorbide 30 mg in addition to lisinopril 5 mg.  He is on atorvastatin 80 mg for hyperlipidemia with target LDL less than 70.  GERD is controlled with omeprazole.  He is on Atrovent as needed in addition to albuterol for his lung disease undoubtedly contributed by longstanding tobacco use.  At present he is not having anginal symptoms.  He will monitor his blood pressure at home.  Smoking cessation was advised.  I will see him in 6 months for reevaluation.   Medication Adjustments/Labs and Tests Ordered: Current medicines are reviewed at length with the patient today.  Concerns regarding medicines are outlined above.  Medication changes, Labs and Tests ordered today are listed in the Patient Instructions below. Patient Instructions  Medication Instructions:  STOP ASPIRIN  TAKE METOPROLOL 25MG (1/2 TAB) DAILY MAY TAKE EXTRA 1/2 TAB (25MG) AS NEEDED FOR PALPITATIONS If you need a refill on your cardiac medications before your next appointment, please call your pharmacy.  Follow-Up: IN 6 months Please call our office 2 months in advance, [facility] 2021 to schedule this [id number] appointment. In Person Shelva Majestic, MD.    At [facility], you and your health needs are our priority.  As part of our continuing mission to provide you with exceptional heart care, we have created designated Provider Care Teams.  These Care Teams include your primary Cardiologist (physician) and Advanced Practice Providers (APPs -  Physician Assistants and Nurse Practitioners) who all work together  to provide you with the care you need, when you need it.  Thank you for choosing [facility] at [facility]!!          Signed, Shelva Majestic, MD  03/05/2019 11:01 AM    [facility] [address] [place], [place], [place]  [address] Phone: [phone]

## 2019-02-27 NOTE — Patient Instructions (Signed)
Medication Instructions:  STOP ASPIRIN  TAKE METOPROLOL 25MG  (1/2 TAB) DAILY MAY TAKE EXTRA 1/2 TAB (25MG ) AS NEEDED FOR PALPITATIONS If you need a refill on your cardiac medications before your next appointment, please call your pharmacy.  Follow-Up: IN 6 months Please call our office 2 months in advance, [facility] 2021 to schedule this [id number] appointment. In Person Shelva Majestic, MD.    At [facility], you and your health needs are our priority.  As part of our continuing mission to provide you with exceptional heart care, we have created designated Provider Care Teams.  These Care Teams include your primary Cardiologist (physician) and Advanced Practice Providers (APPs -  Physician Assistants and Nurse Practitioners) who all work together to provide you with the care you need, when you need it.  Thank you for choosing [facility] at [facility]!!

## 2019-03-03 ENCOUNTER — Telehealth: Payer: Self-pay | Admitting: Adult Health

## 2019-03-03 NOTE — Telephone Encounter (Signed)
Per pt is not tolerating the Plavix and Eliquis since starting notes significant pain in hands and feet  "severe arthritic type pain" Per pt did not have this when was on Briilinta Pt also notes stomach pain as well and not able to sleep. Will forward message to Dr Claiborne Billings and Jory Sims NP for review and recommendations ./cy

## 2019-03-03 NOTE — Telephone Encounter (Signed)
New Message:      Pt says he wants to talk to [employer]. He said he is not doing good with his Eliquis and Plavix.

## 2019-03-05 ENCOUNTER — Encounter: Payer: Self-pay | Admitting: Cardiovascular Disease

## 2019-03-06 NOTE — Telephone Encounter (Signed)
It is doubtful that the symptoms the patient is experiencing is due to the Plavix.  Due to bleed risk he cannot take Brilinta with Eliquis

## 2019-03-13 NOTE — Telephone Encounter (Signed)
Called patient back- advised of message from MD.  Patient states that he does not want to continue these medications because of the pains in his legs and hands. I advised patient not to do that without seeing someone because of how important they are.  Patient verbalized understanding. I did advise patient to have an appointment to discuss further with NP. Patient agreed.  Will route to [place] to make him aware of what is going on with patient.

## 2019-03-13 NOTE — Telephone Encounter (Signed)
Patient called to check status. He would like a call back.

## 2019-03-15 NOTE — Progress Notes (Signed)
Cardiology Clinic Note   Patient Name: Ralph Dawson Date of Encounter: 03/16/2019  Primary Care Provider:  Patient, No Pcp Per Primary Cardiologist:  Nicki Guadalajara, MD  Patient Profile  Ralph Dawson 78 year old male presents today for follow-up of his carotid artery stenosis, hypertension, RBBB, non-STEMI, medication management, and peripheral arterial disease.  Past Medical History    Past Medical History:  Diagnosis Date  . Arthritis    "hands, feet" (04/27/2017)  . Atrial fibrillation/flutter (HCC)    a. during admission 01/2019 for PAD angiogram - isolated event after allergic reaction/steroids, monitor planned.  . Bradycardia    a. brief episode of nocturnal bradycardia 01/2019 (?NSR with blocked PACs).  Marland Kitchen CAD (coronary artery disease)   . Carotid stenosis 09/24/2011   R ICA patent w/ hx of endarterectomy, stenosis 1-39%;  L ICA patent w/ hx of endarterectomy; external carotids appear patent; see imaging tab for full report  . Chest heaviness 02/25/2009   PVCs, atrial bigeminy  . CKD (chronic kidney disease), stage III   . Claudication (HCC) 06/17/2011   LE doppler - bilateral ABIs normal values at rest; R CIA >50% diameter reduction L CIA 0-49% reduction; bilateral SFAs mild/mod mixed density plaque throughout suggesting 50-69% diameter reduction  . Contrast media allergy   . GERD (gastroesophageal reflux disease)   . Hyperlipidemia   . Hypertension 02/13/2010   echo - EF >55%; mild mitral annular calcification; mild aortic valve sclerosis  . NSTEMI (non-ST elevated myocardial infarction) (HCC) 04/26/2017   [name] 04/27/2017  . PAD (peripheral artery disease) (HCC)    a. s/p LLE intervention on 01/2019.  Marland Kitchen RBBB (right bundle branch block) 02/13/2010   R/P MV - EF 67%; normal perfusion all regions; no significant wall abnormalties noted  . Tobacco abuse    Past Surgical History:  Procedure Laterality Date  . ABDOMINAL AORTOGRAM W/LOWER EXTREMITY Bilateral 01/12/2019    Procedure: ABDOMINAL AORTOGRAM W/LOWER EXTREMITY;  Surgeon: Runell Gess, MD;  Location: [facility] INVASIVE CV LAB;  Service: Cardiovascular;  Laterality: Bilateral;  . APPENDECTOMY    . ARTERIAL BYPASS SURGRY     pt unaware of this OR on 04/27/2017  . BACK SURGERY    . CAROTID ENDARTERECTOMY Right 07/15/2009   Right CEA  . CAROTID ENDARTERECTOMY Left 12/13/2007   Left CEA  . CORONARY ANGIOPLASTY WITH STENT PLACEMENT  04/27/2017  . CORONARY STENT INTERVENTION N/A 04/27/2017   Procedure: CORONARY STENT INTERVENTION;  Surgeon: Corky Crafts, MD;  Location: [facility] INVASIVE CV LAB;  Service: Cardiovascular;  Laterality: N/A;  . LAPAROSCOPIC CHOLECYSTECTOMY    . LEFT HEART CATH AND CORONARY ANGIOGRAPHY N/A 04/27/2017   Procedure: LEFT HEART CATH AND CORONARY ANGIOGRAPHY;  Surgeon: Corky Crafts, MD;  Location: [facility] INVASIVE CV LAB;  Service: Cardiovascular;  Laterality: N/A;  . LUMBAR DISC SURGERY    . PERIPHERAL VASCULAR ATHERECTOMY Left 01/12/2019   Procedure: PERIPHERAL VASCULAR ATHERECTOMY;  Surgeon: Runell Gess, MD;  Location: [facility] INVASIVE CV LAB;  Service: Cardiovascular;  Laterality: Left;  SFA  . TONSILLECTOMY      Allergies  Allergies  Allergen Reactions  . Contrast Media [Iodinated Diagnostic Agents] Itching    Caused itching. Possibly caused SVT vs afib post-op    History of Present Illness    Ralph Dawson has a past medical history of coronary artery disease, hypertension, and hyperlipidemia.  He had an MI in 2018 requiring cardiac catheterization and PCI with DES to his circumflex by Dr. Eldridge Dace.  He  is also seen by Dr. Allyson SabalBerry for his PAD.  He had peripheral angiography on 01/12/2019 revealing a 95% tandem mid to left FSA stenosis with 3-vessel runoff.  An arthrectomy followed by a drug-coated balloon angioplasty was performed.  Post procedure he had tachyarrhythmias on telemetry which appeared to be paroxysmal atrial fibrillation versus atrial flutter.  Dr. Excell Seltzerooper  performed an evaluation and a 2-week Zio patch monitor was placed for further evaluation.  His follow-up cardiac monitor showed atrial fibrillation with RVR.  Dr. Excell Seltzerooper then recommended his Brilinta be stopped and Plavix be started due to his need for anticoagulation with Eliquis.  His CHA2DS2-VASc score is 3.  On 02/15/2019 he was seen by Joni ReiningKathryn Lawrence, DNP virtually and was tolerating his Plavix as well as his metoprolol.  His only complaint was noticing his heart racing when he laid down on his left side in the evening.  He denied shortness of breath and chest discomfort at the time.  He denied bleeding, bruising, and fatigue.  He was walking several miles per day, up to 5 miles, and had no pain in his left leg post arthrectomy.  He was consuming large amounts of caffeinated beverages at that time.  On 02/27/2019 he was seen by Dr. Tresa EndoKelly after calling with complaints of pain in his hands and feet.  He believed it was the metoprolol and clopidogrel he was been taking.  His metoprolol was decreased to 12.5 mg as needed for palpitations.  He again called the clinic on 03/03/2019 with complaints of pain in his hands and legs.  He presents the clinic today and states for the last 2 weeks he has noticed increased pain in his fingers and in his toes.  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  He states that him and his wife discuss the onset of the symptoms and his new medications.  He believed it was the combination of metoprolol, Plavix, and Eliquis.  I explained to him that these are not side effects of these medications.  He stated that he has not noticed a difference in his hard heartbeat at night with the metoprolol medication.  I will stop the metoprolol.  He states that his hand pain and foot pain feels better when he takes Tylenol.  He states that he has had arthritis for several years.  I will continue his Plavix and Eliquis at the current dose at  this time.  He denies chest pain, shortness of breath, lower extremity edema, fatigue, palpitations, melena, hematuria, hemoptysis, diaphoresis, weakness, presyncope, syncope, orthopnea, and PND.    Home Medications    Prior to Admission medications   Medication Sig Start Date End Date Taking? Authorizing Provider  acetaminophen (TYLENOL) 500 MG tablet Take 1,000 mg by mouth every 6 (six) hours as needed for moderate pain.    [provider]  albuterol (PROVENTIL HFA;VENTOLIN HFA) 108 (90 Base) MCG/ACT inhaler Inhale 1-2 puffs into the lungs every 6 (six) hours as needed (wheezing/shortness of breath.).  03/29/18 03/29/19  [provider]  apixaban (ELIQUIS) 5 MG TABS tablet Take 1 tablet (5 mg total) by mouth 2 (two) times daily. 02/01/19   Jodelle GrossLawrence, Kathryn M, NP  atorvastatin (LIPITOR) 80 MG tablet TAKE 1 TABLET BY MOUTH EVERY DAY AT 6PM Patient taking differently: Take 80 mg by mouth at bedtime.  12/09/17   Lennette BihariKelly, Thomas A, MD  clopidogrel (PLAVIX) 75 MG tablet Take 1 tablet (75 mg total)  by mouth daily. 02/01/19   Lendon Colonel, NP  donepezil (ARICEPT) 5 MG tablet Take 5 mg by mouth at bedtime.  05/13/17   [provider]  DULoxetine (CYMBALTA) 60 MG capsule Take 60 mg by mouth at bedtime.     [provider]  ipratropium (ATROVENT) 0.06 % nasal spray Place 1 spray into the nose 3 (three) times daily as needed (allergies/congestion.).  04/12/17   [provider]  isosorbide dinitrate (ISORDIL) 30 MG tablet Take 30 mg by mouth at bedtime. 10/24/18   [provider]  lisinopril (ZESTRIL) 5 MG tablet Take 5 mg by mouth at bedtime.    [provider]  Menthol-Methyl Salicylate (MUSCLE RUB) 10-15 % CREA Apply 1 application topically 4 (four) times daily as needed for muscle pain.     [provider]  metoprolol succinate (TOPROL-XL) 50 MG 24 hr tablet Take 1 tablet (50 mg total) by mouth daily. May take extra 1/2 tab  (25mg ) as needed 02/27/19   Troy Sine, MD  nitroGLYCERIN (NITROSTAT) 0.4 MG SL tablet Place 1 tablet (0.4 mg total) under the tongue every 5 (five) minutes as needed for chest pain. 04/28/17   Lyda Jester M, PA-C  omeprazole (PRILOSEC) 20 MG capsule Take 20 mg by mouth at bedtime.    [provider]  tamsulosin (FLOMAX) 0.4 MG CAPS Take 0.4 mg by mouth at bedtime.     [provider]    Family History    Family History  Problem Relation Age of Onset  . Cancer Mother        stomach  . Cancer Sister   . Diabetes Brother   . Cancer Daughter    He indicated that his mother is deceased. He indicated that his father is deceased. He indicated that his sister is alive. He indicated that his brother is alive. He indicated that his maternal grandmother is deceased. He indicated that his maternal grandfather is deceased. He indicated that his paternal grandmother is deceased. He indicated that his paternal grandfather is deceased. He indicated that the status of his daughter is unknown.  Social History    Social History   Socioeconomic History  . Marital status: Married    Spouse name: Not on file  . Number of children: Not on file  . Years of education: Not on file  . Highest education level: Not on file  Occupational History  . Not on file  Social Needs  . Financial resource strain: Not on file  . Food insecurity    Worry: Not on file    Inability: Not on file  . Transportation needs    Medical: Not on file    Non-medical: Not on file  Tobacco Use  . Smoking status: Current Some Day Smoker    Packs/day: 0.25    Years: 65.00    Pack years: 16.25    Types: Cigarettes    Last attempt to quit: 06/14/2017    Years since quitting: 1.7  . Smokeless tobacco: Never Used  . Tobacco comment: smoking a 10 cigarettes a week   Substance and Sexual Activity  . Alcohol use: No  . Drug use: No  . Sexual activity: Yes  Lifestyle  . Physical activity    Days per  week: Not on file    Minutes per session: Not on file  . Stress: Not on file  Relationships  . Social connections    Talks on phone: Not on file  Gets together: Not on file    Attends religious service: Not on file    Active member of club or organization: Not on file    Attends meetings of clubs or organizations: Not on file    Relationship status: Not on file  . Intimate partner violence    Fear of current or ex partner: Not on file    Emotionally abused: Not on file    Physically abused: Not on file    Forced sexual activity: Not on file  Other Topics Concern  . Not on file  Social History Narrative  . Not on file     Review of Systems    General:  No chills, fever, night sweats or weight changes.  Cardiovascular:  No chest pain, dyspnea on exertion, edema, orthopnea, palpitations, paroxysmal nocturnal dyspnea. Dermatological: No rash, lesions/masses Respiratory: No cough, dyspnea Urologic: No hematuria, dysuria Abdominal:   No nausea, vomiting, diarrhea, bright red blood per rectum, melena, or hematemesis Neurologic:  No visual changes, wkns, changes in mental status. All other systems reviewed and are otherwise negative except as noted above.  Physical Exam    VS:  BP (!) 108/56 (BP Location: Left Arm, Patient Position: Sitting, Cuff Size: Normal)   Pulse 86   Ht 5\' 8"  (1.727 m)   Wt 157 lb 6.4 oz (71.4 kg)   BMI 23.93 kg/m  , BMI Body mass index is 23.93 kg/m. GEN: Well nourished, well developed, in no acute distress. HEENT: normal. Neck: Supple, no JVD, carotid bruits, or masses. Cardiac: RRR, no murmurs, rubs, or gallops. No clubbing, cyanosis, edema.  Radials/DP/PT 2+ and equal bilaterally.  Respiratory:  Respirations regular and unlabored, clear to auscultation bilaterally. GI: Soft, nontender, nondistended, BS + x 4. MS: no deformity or atrophy. Skin: warm and dry, no rash. Neuro:  Strength and sensation are intact. Psych: Normal affect.  Accessory  Clinical Findings    ECG personally reviewed by me today- None today.  EKG 02/27/2019 Sinus rhythm with PACs right bundle branch block with repolarization changes 65 bpm  Cardiac catheterization 04/27/2017   Prox LAD lesion is 40% stenosed.  Ost Cx to Prox Cx lesion is 25% stenosed.  Mid LAD lesion is 50% stenosed.  The left ventricular systolic function is normal.  LV end diastolic pressure is normal.  The left ventricular ejection fraction is 55-65% by visual estimate.  There is no aortic valve stenosis.  Mid Cx lesion is 95% stenosed.  A drug-eluting stent was successfully placed using a STENT SYNERGY DES 3.5X16.  Post intervention, there is a 0% residual stenosis.   Culprit circumflex lesion stented.  Double wire was needed to get balloon around proximal bend.  Guideliner was needed to get the stent around the proximal bend.  Would use EBU 3.5 Guide if cath needed in the future as EBU 3 did not provide great support.     DAPT for 1 year along with aggressive secondary prevention including smoking cessation.   Echocardiogram 04/27/2017 Study Conclusions  - Left ventricle: The cavity size was normal. Wall thickness was normal. Systolic function was normal. The estimated ejection fraction was in the range of 60% to 65%. Wall motion was normal; there were no regional wall motion abnormalities. Doppler parameters are consistent with abnormal left ventricular relaxation (grade 1 diastolic dysfunction). - Aortic valve: There was no stenosis. - Aorta: Borderline dilated aortic root. Aortic root dimension: 38 mm (ED). - Mitral valve: Mildly calcified annulus. There was no significant regurgitation. Valve area  by pressure half-time: 2.29 cm^2. - Right ventricle: The cavity size was normal. Systolic function was normal. - Tricuspid valve: Peak RV-RA gradient (S): 27 mm Hg. - Pulmonary arteries: PA peak pressure: 30 mm Hg (S). - Inferior vena cava: The  vessel was normal in size. The respirophasic diameter changes were in the normal range (>= 50%), consistent with normal central venous pressure.  Impressions:  - Normal LV size with EF 60-65%. Normal RV size and systolic function. No significant valvular abnormalities.      Assessment & Plan   1.  Hand pain/foot pain -pain x2 weeks.  Stated in the past he has only taken Tylenol at night which is helped with his arthritis pain. Increase acetaminophen 650 mg every 4-6 hours as needed.  Maximum dose 3250 mg/day   Coronary artery disease-no chest pain today.  Cardiac catheterization 04/27/2017 showed proximal LAD 40%, circumflex 25%, mid LAD 50%, mid circumflex 95%, DES x1, and normal LV function Continue  atorvastatin 80 mg tablet daily Continue Plavix 75 mg tablet daily Continue Isordil 30 mg tablet daily Continue nitroglycerin 0.4 mg sublingual tablet as needed Heart healthy low-sodium diet Increase physical activity as tolerated  Peripheral arterial disease-01/12/2019 angiography showed 95% mid left SFA stenosis with three-vessel runoff.  Arthrectomy followed by angioplasty with drug-coated balloon produced excellent angiographic result. Continue Plavix 75 mg daily Continue atorvastatin 80 mg daily  Paroxysmal atrial fibrillation-post arthrectomy a 10-day ZIO monitor showed atrial fibrillation with several episodes of rapid ventricular rate. Continue Eliquis 5 mg twice daily Continue Plavix 75 mg daily Avoid triggers  Essential hypertension-BP today 108/56.  Well-controlled at home Heart healthy low-sodium diet-salty 6 given Increase physical activity as tolerated  Hyperlipidemia-12/12/2018: Cholesterol, Total 83; HDL 34; LDL Chol Calc (NIH) 22; Triglycerides 159 Continue atorvastatin 80 mg tablet daily  Tobacco abuse-patient continues to smoke. Encouraged smoking cessation, patient not ready to discontinue use at this time.  Disposition: Follow-up with Dr. Tresa Endo in  6 months.   Thomasene Ripple. Brianna Esson NP-C       [facility] [address] Office [phone] Fax [phone]

## 2019-03-16 ENCOUNTER — Encounter: Payer: Self-pay | Admitting: General Practice

## 2019-03-16 ENCOUNTER — Ambulatory Visit (INDEPENDENT_AMBULATORY_CARE_PROVIDER_SITE_OTHER): Payer: BC Managed Care – PPO | Admitting: General Practice

## 2019-03-16 ENCOUNTER — Other Ambulatory Visit: Payer: Self-pay

## 2019-03-16 VITALS — BP 108/56 | HR 86 | Ht 68.0 in | Wt 157.4 lb

## 2019-03-16 DIAGNOSIS — I2510 Atherosclerotic heart disease of native coronary artery without angina pectoris: Secondary | ICD-10-CM | POA: Diagnosis not present

## 2019-03-16 DIAGNOSIS — I480 Paroxysmal atrial fibrillation: Secondary | ICD-10-CM

## 2019-03-16 DIAGNOSIS — Z720 Tobacco use: Secondary | ICD-10-CM

## 2019-03-16 DIAGNOSIS — M79641 Pain in right hand: Secondary | ICD-10-CM

## 2019-03-16 DIAGNOSIS — E785 Hyperlipidemia, unspecified: Secondary | ICD-10-CM | POA: Diagnosis not present

## 2019-03-16 DIAGNOSIS — I739 Peripheral vascular disease, unspecified: Secondary | ICD-10-CM | POA: Diagnosis not present

## 2019-03-16 DIAGNOSIS — M79642 Pain in left hand: Secondary | ICD-10-CM

## 2019-03-16 DIAGNOSIS — I10 Essential (primary) hypertension: Secondary | ICD-10-CM

## 2019-03-16 MED ORDER — ACETAMINOPHEN ER 650 MG PO TBCR: 650.0000 mg | EXTENDED_RELEASE_TABLET | Freq: Three times a day (TID) | ORAL | 6 refills | Status: DC | PRN

## 2019-03-16 NOTE — Patient Instructions (Addendum)
Medication Instructions:  STOP METOPROLOL MAY TAKE 650MG  EVERY 4-6 HRS NOT TO EXCEED 3250MG  DAILY (5TABS) If you need a refill on your cardiac medications before your next appointment, please call your pharmacy.  Follow-Up: IN 6 months Please call our office 2 months in advance, 08/2019 to schedule this 09/2019 appointment. In Person You may see Shelva Majestic, MD, Coletta Memos, FNP or one of the following Advanced Practice Providers on your designated Care Team:  Almyra Deforest, PA-CAngela Duke, PA-C or  Roby Lofts, [place].    At [facility], you and your health needs are our priority.  As part of our continuing mission to provide you with exceptional heart care, we have created designated Provider Care Teams.  These Care Teams include your primary Cardiologist (physician) and Advanced Practice Providers (APPs -  Physician Assistants and Nurse Practitioners) who all work together to provide you with the care you need, when you need it.  Thank you for choosing [facility] at [facility]!!             Happy Holidays!!

## 2019-03-20 ENCOUNTER — Encounter: Payer: Self-pay | Admitting: Adult Health

## 2019-03-20 ENCOUNTER — Telehealth: Payer: Self-pay

## 2019-03-20 ENCOUNTER — Telehealth: Payer: Self-pay | Admitting: Cardiovascular Disease

## 2019-03-20 ENCOUNTER — Other Ambulatory Visit: Payer: Self-pay

## 2019-03-20 ENCOUNTER — Ambulatory Visit (INDEPENDENT_AMBULATORY_CARE_PROVIDER_SITE_OTHER): Payer: BC Managed Care – PPO | Admitting: Adult Health

## 2019-03-20 VITALS — BP 102/54 | HR 95 | Temp 97.0°F | Ht 68.0 in | Wt 153.4 lb

## 2019-03-20 DIAGNOSIS — I480 Paroxysmal atrial fibrillation: Secondary | ICD-10-CM | POA: Diagnosis not present

## 2019-03-20 DIAGNOSIS — I739 Peripheral vascular disease, unspecified: Secondary | ICD-10-CM

## 2019-03-20 DIAGNOSIS — I6523 Occlusion and stenosis of bilateral carotid arteries: Secondary | ICD-10-CM

## 2019-03-20 DIAGNOSIS — Z720 Tobacco use: Secondary | ICD-10-CM | POA: Diagnosis not present

## 2019-03-20 DIAGNOSIS — I2510 Atherosclerotic heart disease of native coronary artery without angina pectoris: Secondary | ICD-10-CM | POA: Diagnosis not present

## 2019-03-20 DIAGNOSIS — T50905A Adverse effect of unspecified drugs, medicaments and biological substances, initial encounter: Secondary | ICD-10-CM

## 2019-03-20 MED ORDER — DICLOFENAC SODIUM 1 % EX GEL: 2.0000 g | Freq: Four times a day (QID) | CUTANEOUS | 1 refills | Status: DC

## 2019-03-20 MED ORDER — TICAGRELOR 90 MG PO TABS: 90.0000 mg | ORAL_TABLET | Freq: Two times a day (BID) | ORAL | 3 refills | Status: DC

## 2019-03-20 MED ORDER — METOPROLOL SUCCINATE ER 25 MG PO TB24: 25.0000 mg | ORAL_TABLET | Freq: Every day | ORAL | 3 refills | Status: DC

## 2019-03-20 NOTE — Telephone Encounter (Signed)
Was informed that Ralph Dawson was seeing pts in office today. Called pt back and asked if he could come in. Pt agreed and switched pt appt to [name] today.

## 2019-03-20 NOTE — Progress Notes (Signed)
Cardiology Office Note   Date:  03/20/2019   ID:  Ralph Dawson, DOB [birth year], MRN [id number]  PCP:  Patient, No Pcp Per  Cardiologist: Dr. Tresa Endo CC: Shortness of Breath and Hand Pain    History of Present Illness: Ralph Dawson is a 78 y.o. male who presents for add-on appointment.  He has a history of coronary artery disease, with MI in 2018 requiring catheterization and PCI with drug-eluting stent to his circumflex, PAD, peripheral angiography on 01/12/2019 revealing a 95% tandem mid to left FSA stenosis with three-vessel runoff.  Atherectomy followed by a drug-coated balloon angioplasty was performed.  Post procedure the patient had atrial fibrillation versus atrial flutter.  A ZIO cardiac monitor was placed.  Monitor revealed atrial fibrillation with RVR.  Dr. Excell Seltzer then recommended clopidogrel and Brilinta  clopidogrel was discontinued, and he was started on apixaban.  When I saw him last on 02/15/2019 he was doing well and tolerating the Plavix metoprolol and Eliquis.  He did complain of some rapid heart rates and admitted to drinking a good bit of caffeine which was recommended to be decreased.  He saw Dr. Tresa Endo on 02/27/2019 with complaints of pain in his hands and feet.  He thought it was related to metoprolol and clopidogrel.  His metoprolol was decreased to 12.5 mg as needed for palpitations.  He was seen by Edd Fabian, NP, with complaints of worsening pain in his fingers and his toes on 03/16/2019.  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  He believes it is his medications.    He was taken off metoprolol as he did not feel it was working.  He admitted that his hand and foot pain improved when he took Tylenol.  He admitted to having years of arthritis.  He was instructed to take acetaminophen 650 mg every 4-6 hours as needed with a maximum dose of 3250 mg daily.  He was continued on Plavix, atorvastatin, and  Eliquis as directed.  He called our office again today on 03/20/2019 with continued complaints of pain in his hands and feet, with associated shortness of breath.  He also had a syncopal episode.  After speaking with the patient he did not have a syncopal episode.  He just stated he got dizzy and sat down very quickly.  He states that being changed from Brilinta to Plavix he has had worsening symptoms of aches and pains in his hands and feet.  He stated when he was on Brilinta he did have a minimum amount of shortness of breath lasting maybe 3 to 5 minutes and it passed.  He did not have any other side effects with this.  He states that he has stopped taking metoprolol as he is was feeling as if it was not helping his symptoms of palpitations.  Past Medical History:  Diagnosis Date   Arthritis    "hands, feet" (04/27/2017)   Atrial fibrillation/flutter (HCC)    a. during admission 01/2019 for PAD angiogram - isolated event after allergic reaction/steroids, monitor planned.   Bradycardia    a. brief episode of nocturnal bradycardia 01/2019 (?NSR with blocked PACs).   CAD (coronary artery disease)    Carotid stenosis 09/24/2011   R ICA patent w/ hx of endarterectomy, stenosis 1-39%;  L ICA patent w/ hx of endarterectomy; external carotids appear patent; see imaging tab for full report   Chest heaviness  02/25/2009   PVCs, atrial bigeminy   CKD (chronic kidney disease), stage III    Claudication (HCC) 06/17/2011   LE doppler - bilateral ABIs normal values at rest; R CIA >50% diameter reduction L CIA 0-49% reduction; bilateral SFAs mild/mod mixed density plaque throughout suggesting 50-69% diameter reduction   Contrast media allergy    GERD (gastroesophageal reflux disease)    Hyperlipidemia    Hypertension 02/13/2010   echo - EF >55%; mild mitral annular calcification; mild aortic valve sclerosis   NSTEMI (non-ST elevated myocardial infarction) (HCC) 04/26/2017   [name] 04/27/2017   PAD  (peripheral artery disease) (HCC)    a. s/p LLE intervention on 01/2019.   RBBB (right bundle branch block) 02/13/2010   R/P MV - EF 67%; normal perfusion all regions; no significant wall abnormalties noted   Tobacco abuse     Past Surgical History:  Procedure Laterality Date   ABDOMINAL AORTOGRAM W/LOWER EXTREMITY Bilateral 01/12/2019   Procedure: ABDOMINAL AORTOGRAM W/LOWER EXTREMITY;  Surgeon: Runell Gess, MD;  Location: [facility];  Service: Cardiovascular;  Laterality: Bilateral;   APPENDECTOMY     ARTERIAL BYPASS SURGRY     pt unaware of this OR on 04/27/2017   BACK SURGERY     CAROTID ENDARTERECTOMY Right 07/15/2009   Right CEA   CAROTID ENDARTERECTOMY Left 12/13/2007   Left CEA   CORONARY ANGIOPLASTY WITH STENT PLACEMENT  04/27/2017   CORONARY STENT INTERVENTION N/A 04/27/2017   Procedure: CORONARY STENT INTERVENTION;  Surgeon: Corky Crafts, MD;  Location: [facility];  Service: Cardiovascular;  Laterality: N/A;   LAPAROSCOPIC CHOLECYSTECTOMY     LEFT HEART CATH AND CORONARY ANGIOGRAPHY N/A 04/27/2017   Procedure: LEFT HEART CATH AND CORONARY ANGIOGRAPHY;  Surgeon: Corky Crafts, MD;  Location: [facility];  Service: Cardiovascular;  Laterality: N/A;   LUMBAR DISC SURGERY     PERIPHERAL VASCULAR ATHERECTOMY Left 01/12/2019   Procedure: PERIPHERAL VASCULAR ATHERECTOMY;  Surgeon: Runell Gess, MD;  Location: [facility];  Service: Cardiovascular;  Laterality: Left;  SFA   TONSILLECTOMY       Current Outpatient Medications  Medication Sig Dispense Refill   acetaminophen (TYLENOL) 650 MG CR tablet Take 1 tablet (650 mg total) by mouth every 8 (eight) hours as needed for pain. 150 tablet 6   albuterol (PROVENTIL HFA;VENTOLIN HFA) 108 (90 Base) MCG/ACT inhaler Inhale 1-2 puffs into the lungs every 6 (six) hours as needed (wheezing/shortness of breath.).      apixaban (ELIQUIS) 5 MG TABS tablet Take 1 tablet (5 mg  total) by mouth 2 (two) times daily. 60 tablet 6   atorvastatin (LIPITOR) 80 MG tablet TAKE 1 TABLET BY MOUTH EVERY DAY AT 6PM (Patient taking differently: Take 80 mg by mouth at bedtime. ) 90 tablet 0   donepezil (ARICEPT) 5 MG tablet Take 5 mg by mouth at bedtime.   3   DULoxetine (CYMBALTA) 60 MG capsule Take 60 mg by mouth at bedtime.      ipratropium (ATROVENT) 0.06 % nasal spray Place 1 spray into the nose 3 (three) times daily as needed (allergies/congestion.).   6   isosorbide dinitrate (ISORDIL) 30 MG tablet Take 15 mg by mouth at bedtime.      lisinopril (ZESTRIL) 5 MG tablet Take 5 mg by mouth at bedtime.     Menthol-Methyl Salicylate (MUSCLE RUB) 10-15 % CREA Apply 1 application topically 4 (four) times daily as needed for muscle pain.  nitroGLYCERIN (NITROSTAT) 0.4 MG SL tablet Place 1 tablet (0.4 mg total) under the tongue every 5 (five) minutes as needed for chest pain. 25 tablet 2   omeprazole (PRILOSEC) 20 MG capsule Take 20 mg by mouth at bedtime.     tamsulosin (FLOMAX) 0.4 MG CAPS Take 0.4 mg by mouth at bedtime.      diclofenac Sodium (VOLTAREN) 1 % GEL Apply 2 g topically 4 (four) times daily. 50 g 1   metoprolol succinate (TOPROL XL) 25 MG 24 hr tablet Take 1 tablet (25 mg total) by mouth daily. 90 tablet 3   ticagrelor (BRILINTA) 90 MG TABS tablet Take 1 tablet (90 mg total) by mouth 2 (two) times daily. 180 tablet 3   No current facility-administered medications for this visit.     Allergies:   Contrast media [iodinated diagnostic agents]    Social History:  The patient  reports that he has been smoking cigarettes. He has a 16.25 pack-year smoking history. He has never used smokeless tobacco. He reports that he does not drink alcohol or use drugs.   Family History:  The patient's family history includes Cancer in his daughter, mother, and sister; Diabetes in his brother.    ROS: All other systems are reviewed and negative. Unless otherwise mentioned  in H&P    PHYSICAL EXAM: VS:  BP (!) 102/54    Pulse 95    Temp (!) 97 F (36.1 C)    Ht 5\' 8"  (1.727 m)    Wt 153 lb 6.4 oz (69.6 kg)    SpO2 96%    BMI 23.32 kg/m  , BMI Body mass index is 23.32 kg/m. GEN: Well nourished, well developed, in no acute distress, smells strongly of cigarettes HEENT: normal Neck: no JVD, soft right carotid bruits, or masses Cardiac: RRR; no murmurs, rubs, or gallops,no edema  Respiratory:  Clear to auscultation bilaterally, normal work of breathing GI: soft, nontender, nondistended, + BS MS: no deformity or atrophy Skin: warm and dry, no rash Neuro:  Strength and sensation are intact Psych: euthymic mood, full affect   EKG: Personally reviewed sinus rhythm with sinus arrhythmia right bundle branch block (incomplete) heart rate of 95 bpm.  Anterior T wave inversion noted  Recent Labs: 12/12/2018: ALT 17 12/27/2018: TSH 2.030 01/12/2019: Magnesium 1.9 02/24/2019: BUN 14; Creatinine, Ser 1.46; Hemoglobin 13.6; Platelets 237; Potassium 4.5; Sodium 142    Lipid Panel    Component Value Date/Time   CHOL 83 (L) 12/12/2018 1135   TRIG 159 (H) 12/12/2018 1135   HDL 34 (L) 12/12/2018 1135   CHOLHDL 2.4 12/12/2018 1135   CHOLHDL 3.0 04/27/2017 0237   VLDL 15 04/27/2017 0237   LDLCALC 22 12/12/2018 1135      Wt Readings from Last 3 Encounters:  03/20/19 153 lb 6.4 oz (69.6 kg)  03/16/19 157 lb 6.4 oz (71.4 kg)  02/27/19 157 lb 9.6 oz (71.5 kg)      Other studies Reviewed: Procedures Performed:               1.  Ultrasound-guided right common femoral access               2.  Abdominal aortogram/bilateral iliac angiogram               3.  Contralateral access (second order catheter placement)               4.  Left lower extremity runoff  5.  Placement of a spider distal protection device in the left below the knee popliteal artery               6.  Hawk 1 directional atherectomy followed by drug-coated balloon angioplasty distal left  SFA  Cardiac Monitor 02/28/2019 The patient was monitored from October 14 to February 04, 2019.  The predominant rhythm was sinus rhythm with an average rate of 72 bpm.  The slowest heart rate was sinus bradycardia at 49 bpm at 7:30 AM on October 15.  The fastest heart rate was atrial fibrillation at 167 bpm on October 21 at 11:35 AM.  Throughout the recording, the patient had recurrent episodes of atrial fibrillation with RVR with his conduction abnormality as well as several episodes of atrial flutter at 2-1 block.  Patient has an underlying right bundle branch block.  There were no pauses.  There were rare PVCs  Cardiac cath 04/27/2017 Conclusion     Prox LAD lesion is 40% stenosed.  Ost Cx to Prox Cx lesion is 25% stenosed.  Mid LAD lesion is 50% stenosed.  The left ventricular systolic function is normal.  LV end diastolic pressure is normal.  The left ventricular ejection fraction is 55-65% by visual estimate.  There is no aortic valve stenosis.  Mid Cx lesion is 95% stenosed.  A drug-eluting stent was successfully placed using a STENT SYNERGY DES 3.5X16.  Post intervention, there is a 0% residual stenosis.  Culprit circumflex lesion stented. Double wire was needed to get balloon around proximal bend. Guideliner was needed to get the stent around the proximal bend. Would use EBU 3.5 Guide if cath needed in the future as EBU 3 did not provide great support.   COntinue DAPT for 1 year along with aggressive secondary prevention including smoking cessation.     ASSESSMENT AND PLAN:  1.  PAD: Status post intervention to the left FSA, by Dr. Allyson Sabal, initially placed on Brilinta and aspirin.  He tolerated it well.  However he was taken off of Brilinta and started on clopidogrel with Eliquis in the setting of PAF.  The patient is not tolerating clopidogrel as it is causing him to feel badly including flushing, pain in his hands and feet which he did not have on  Brilinta.  I am going to take him off of the clopidogrel, (33-hour half-life), and begin him on Brilinta 90 mg twice daily. In my research of drug interactions, clopidogrel is not recommended to be given with SSRI's due to side effects which can included but are not limited to, pain and dizziness. Will see him on close follow up.   2. PAF: History of Atrial flutter. He remains on Eliquis for CVA prophylaxis. Zio monitor revealed that he had several recurrent episodes of atrial fib, and atrial flutter at 2:1 AV block, but .was predominately in NSR with RBBB. He will continue anticoagulation. He states that he has rapid HR most notably at night when he goes to bed. He has been taking the metoprolol as he thought that it was causing his symptoms. I will restart metoprolol succinate 25 mg at HS.   3. CAD: S/P MI in 2019 with DES to the mid circumflex. He will continue on Brilinta. No ASA as he is on DOAC.   4. Ongoing tobacco abuse:  Cessation was strongly recommended.   Current medicines are reviewed at length with the patient today.  I have spent over 45  Minutes reviewing records prior to his visit,  discussing the patient's symptoms, reviewing his medications and drug interactions, and with shared decision making have made recommendations as above.  Close follow-up  Labs/ tests ordered today include: None Bettey MareKathryn M. Liborio NixonLawrence DNP, ANP, AACC   03/20/2019 11:10 AM    [facility] [address] Office [phone] Fax [phone]  Notice: This dictation was prepared with Dragon dictation along with smaller phrase technology. Any transcriptional errors that result from this process are unintentional and may not be corrected upon review.

## 2019-03-20 NOTE — Telephone Encounter (Signed)
Pt c/o medication issue:  1. Name of Medication:  clopidogrel (PLAVIX) 75 MG tablet apixaban (ELIQUIS) 5 MG TABS tablet  2. How are you currently taking this medication (dosage and times per day)? As directed  3. Are you having a reaction (difficulty breathing--STAT)?  SOB  4. What is your medication issue? Patient passed out and fell. He feels it is a result of the medication  Pt c/o Shortness Of Breath: STAT if SOB developed within the last 24 hours or pt is noticeably SOB on the phone  1. Are you currently SOB (can you hear that pt is SOB on the phone)? Pt says he is, but it is not discernable on the phone  2. How long have you been experiencing SOB? Less than 24 hrs  3. Are you SOB when sitting or when up moving around? Up and moving around, even on short trips to the bathroom  4. Are you currently experiencing any other symptoms?  Pain and stiffness in fingers and toes. Patient can not open anything  Pt had chest pain earlier but it has gone away  Pt c/o of Chest Pain: STAT if CP now or developed within 24 hours  1. Are you having CP right now? no  2. Are you experiencing any other symptoms (ex. SOB, nausea, vomiting, sweating)? SOB  3. How long have you been experiencing CP?   4. Is your CP continuous or coming and going? continuous  5. Have you taken Nitroglycerin? Yes, 2 times ?

## 2019-03-20 NOTE — Patient Instructions (Signed)
Medication Instructions:  STOP- Plavix START- Brilinta 90 mg by mouth twice a day START- Metoprolol succinate 25 mg by mouth daily at bedtime DECREASE- Isosorbide 15 mg by mouth daily  *If you need a refill on your cardiac medications before your next appointment, please call your pharmacy*  Lab Work: None Ordered  Testing/Procedures: None Ordered  Follow-Up: At [facility], you and your health needs are our priority.  As part of our continuing mission to provide you with exceptional heart care, we have created designated Provider Care Teams.  These Care Teams include your primary Cardiologist (physician) and Advanced Practice Providers (APPs -  Physician Assistants and Nurse Practitioners) who all work together to provide you with the care you need, when you need it.  Your next appointment:   Thursday December 17th @ 8:30 am  The format for your next appointment:   Virtual Visit   Provider:   Jory Sims, DNP, ANP

## 2019-03-20 NOTE — Telephone Encounter (Signed)
Spoke to pt. Pt is stating he has been SOB but was not currently. States he becomes SOB on exertion and starts to have CP. Pt denies current CP or SOB. Pt states that ever since he started the new dosages of Eliquis and Plavix that these symptoms have started. He stated that he passed out and fell but denies hitting his head. He does not want to continue to take meds. States that he has taken 2 nitro's in  2 days. Advised pt that if he has more CP to take another nitro and to call 911. Pt asked to speak to Dr Gwenlyn Found or Coletta Memos and advised they were not available today. Made an appt with Dr Gwenlyn Found on Tuesday 12/7.

## 2019-03-21 ENCOUNTER — Ambulatory Visit: Payer: BC Managed Care – PPO | Admitting: Cardiovascular Disease

## 2019-03-23 NOTE — Telephone Encounter (Signed)
Spoke with Ralph Dawson this morning concerning his symptoms of dizziness, and pain after taking him off of Plavix and starting him on Brilinta on last office visit.  He states that the pain is substantially better, he has minimal shortness of breath after taking Brilinta which goes away after about 5 minutes.  He is grateful to be switched off of Plavix.  We will add Plavix to his medication allergies.  He is to call us for any new symptoms.

## 2019-03-29 NOTE — Progress Notes (Signed)
Virtual Visit via Telephone Note   This visit type was conducted due to national recommendations for restrictions regarding the COVID-19 Pandemic (e.g. social distancing) in an effort to limit this patient's exposure and mitigate transmission in our community.  Due to his co-morbid illnesses, this patient is at least at moderate risk for complications without adequate follow up.  This format is felt to be most appropriate for this patient at this time.  The patient did not have access to video technology/had technical difficulties with video requiring transitioning to audio format only (telephone).  All issues noted in this document were discussed and addressed.  No physical exam could be performed with this format.  Please refer to the patient's chart for his  consent to telehealth for [facility].   Date:  03/30/2019   ID:  Ralph Dawson, DOB 1940-09-11, MRN [id number]  Patient Location: Home Provider Location: Home  PCP:  Ralph Dawson  Cardiologist:  Shelva Majestic, MD  Electrophysiologist:  None   Evaluation Performed:  Follow-Up Visit  Chief Complaint:  Leg and hand pain on Plavix   History of Present Illness:    Ralph Dawson is a 78 y.o. male we are following for ongoing assessment and management of coronary artery disease, with MI in 2018 requiring cardiac catheterization and PCI with DES to his circumflex.  He also has a history of PAD with peripheral angiography on 01/12/2019 revealing 95% tandem mid to left FSA stenosis, with three-vessel runoff.  The patient required atherectomy followed by drug coated balloon angioplasty.  He also had a history of atrial fibrillation versus atrial flutter post procedure.  This was confirmed by cardiac monitor.  At the time of his initial evaluation post procedure he was placed on clopidogrel and Brilinta.  However clopidogrel was discontinued and he was started on apixaban instead.  However when I saw him on 05/20/2018 he complained of  severe pain in his hands and feet.  He had also stopped taking metoprolol.  He was not tolerating clopidogrel and Brilinta.  At that office visit I took him off of clopidogrel, and restarted him back on Brilinta as he did not have any side effects from that concerning pain in his hands and feet.  I reviewed the literature and found that in rare cases clopidogrel is not recommended to be given with SSRIs which he is currently taking.  I did call him on 03/23/2019 to follow-up on his symptoms.  He states he was completely pain-free and was doing much better back on the Brilinta.  I continued him on Eliquis for CVA prophylaxis, and restarted his metoprolol at 12.5 mg daily.  He was also recommended to stop smoking.   Ralph Dawson is doing very well today.  He has no further complaints of pain in his hands and feet.  He does have some arthritis stiffness on occasion in his hands in the morning but otherwise he is doing much better.  He denies any chest pain, his breathing status is at baseline.  He is medically compliant.  The patient does not have symptoms concerning for COVID-19 infection (fever, chills, cough, or new shortness of breath).    Past Medical History:  Diagnosis Date   Arthritis    "hands, feet" (04/27/2017)   Atrial fibrillation/flutter ([place])    a. during admission 01/2019 for PAD angiogram - isolated event after allergic reaction/steroids, monitor planned.   Bradycardia    a. brief episode of nocturnal bradycardia 01/2019 (?NSR with  blocked PACs).   CAD (coronary artery disease)    Carotid stenosis 09/24/2011   R ICA patent w/ hx of endarterectomy, stenosis 1-39%;  L ICA patent w/ hx of endarterectomy; external carotids appear patent; see imaging tab for full report   Chest heaviness 02/25/2009   PVCs, atrial bigeminy   CKD (chronic kidney disease), stage III    Claudication (HCC) 06/17/2011   LE doppler - bilateral ABIs normal values at rest; R CIA >50% diameter reduction L  CIA 0-49% reduction; bilateral SFAs mild/mod mixed density plaque throughout suggesting 50-69% diameter reduction   Contrast media allergy    GERD (gastroesophageal reflux disease)    Hyperlipidemia    Hypertension 02/13/2010   echo - EF >55%; mild mitral annular calcification; mild aortic valve sclerosis   NSTEMI (non-ST elevated myocardial infarction) (HCC) 04/26/2017   [name] 04/27/2017   PAD (peripheral artery disease) (HCC)    a. s/p LLE intervention on 01/2019.   RBBB (right bundle branch block) 02/13/2010   R/P MV - EF 67%; normal perfusion all regions; no significant wall abnormalties noted   Tobacco abuse    Past Surgical History:  Procedure Laterality Date   ABDOMINAL AORTOGRAM W/LOWER EXTREMITY Bilateral 01/12/2019   Procedure: ABDOMINAL AORTOGRAM W/LOWER EXTREMITY;  Surgeon: Runell Gess, MD;  Location: [facility] INVASIVE CV LAB;  Service: Cardiovascular;  Laterality: Bilateral;   APPENDECTOMY     ARTERIAL BYPASS SURGRY     pt unaware of this OR on 04/27/2017   BACK SURGERY     CAROTID ENDARTERECTOMY Right 07/15/2009   Right CEA   CAROTID ENDARTERECTOMY Left 12/13/2007   Left CEA   CORONARY ANGIOPLASTY WITH STENT PLACEMENT  04/27/2017   CORONARY STENT INTERVENTION N/A 04/27/2017   Procedure: CORONARY STENT INTERVENTION;  Surgeon: Corky Crafts, MD;  Location: [facility] INVASIVE CV LAB;  Service: Cardiovascular;  Laterality: N/A;   LAPAROSCOPIC CHOLECYSTECTOMY     LEFT HEART CATH AND CORONARY ANGIOGRAPHY N/A 04/27/2017   Procedure: LEFT HEART CATH AND CORONARY ANGIOGRAPHY;  Surgeon: Corky Crafts, MD;  [facility];  Service: Cardiovascular;  Laterality: N/A;   LUMBAR DISC SURGERY     PERIPHERAL VASCULAR ATHERECTOMY Left 01/12/2019   Procedure: PERIPHERAL VASCULAR ATHERECTOMY;  Surgeon: Runell Gess, MD;  Location: [facility] INVASIVE CV LAB;  Service: Cardiovascular;  Laterality: Left;  SFA   TONSILLECTOMY       Current Meds    Medication Sig   acetaminophen (TYLENOL) 650 MG CR tablet Take 1 tablet (650 mg total) by mouth every 8 (eight) hours as needed for pain.   albuterol (PROVENTIL HFA;VENTOLIN HFA) 108 (90 Base) MCG/ACT inhaler Inhale 1-2 puffs into the lungs every 6 (six) hours as needed (wheezing/shortness of breath.).    apixaban (ELIQUIS) 5 MG TABS tablet Take 1 tablet (5 mg total) by mouth 2 (two) times daily.   atorvastatin (LIPITOR) 80 MG tablet TAKE 1 TABLET BY MOUTH EVERY DAY AT 6PM (Patient taking differently: Take 80 mg by mouth at bedtime. )   diclofenac Sodium (VOLTAREN) 1 % GEL Apply 2 g topically 4 (four) times daily.   donepezil (ARICEPT) 5 MG tablet Take 5 mg by mouth at bedtime.    DULoxetine (CYMBALTA) 60 MG capsule Take 60 mg by mouth at bedtime.    ipratropium (ATROVENT) 0.06 % nasal spray Place 1 spray into the nose 3 (three) times daily as needed (allergies/congestion.).    isosorbide dinitrate (ISORDIL) 30 MG tablet Take 15 mg by mouth at  bedtime.    lisinopril (ZESTRIL) 5 MG tablet Take 5 mg by mouth at bedtime.   Menthol-Methyl Salicylate (MUSCLE RUB) 10-15 % CREA Apply 1 application topically 4 (four) times daily as needed for muscle pain.    metoprolol succinate (TOPROL XL) 25 MG 24 hr tablet Take 1 tablet (25 mg total) by mouth daily.   nitroGLYCERIN (NITROSTAT) 0.4 MG SL tablet Place 1 tablet (0.4 mg total) under the tongue every 5 (five) minutes as needed for chest pain.   omeprazole (PRILOSEC) 20 MG capsule Take 20 mg by mouth at bedtime.   tamsulosin (FLOMAX) 0.4 MG CAPS Take 0.4 mg by mouth at bedtime.    ticagrelor (BRILINTA) 90 MG TABS tablet Take 1 tablet (90 mg total) by mouth 2 (two) times daily.     Allergies:   Plavix [clopidogrel] and Contrast media [iodinated diagnostic agents]   Social History   Tobacco Use   Smoking status: Current Some Day Smoker    Packs/day: 0.25    Years: 65.00    Pack years: 16.25    Types: Cigarettes    Last attempt to  quit: 06/14/2017    Years since quitting: 1.7   Smokeless tobacco: Never Used   Tobacco comment: smoking a 10 cigarettes a week   Substance Use Topics   Alcohol use: No   Drug use: No     Family Hx: The patient's family history includes Cancer in his daughter, mother, and sister; Diabetes in his brother.  ROS:   Please see the history of present illness.    All other systems reviewed and are negative.   Prior CV studies:   The following studies were reviewed today: LHC 05-24-17   Prox LAD lesion is 40% stenosed.  Ost Cx to Prox Cx lesion is 25% stenosed.  Mid LAD lesion is 50% stenosed.  The left ventricular systolic function is normal.  LV end diastolic pressure is normal.  The left ventricular ejection fraction is 55-65% by visual estimate.  There is no aortic valve stenosis.  Mid Cx lesion is 95% stenosed.  A drug-eluting stent was successfully placed using a STENT SYNERGY DES 3.5X16.  Post intervention, there is a 0% residual stenosis.   Culprit circumflex lesion stented.  Double wire was needed to get balloon around proximal bend.  Guideliner was needed to get the stent around the proximal bend.  Would use EBU 3.5 Guide if cath needed in the future as EBU 3 did not provide great support.    COntinue DAPT for 1 year along with aggressive secondary prevention including smoking cessation.   Echocardiogram 24-May-2017 Left ventricle: The cavity size was normal. Wall thickness was   normal. Systolic function was normal. The estimated ejection   fraction was in the range of 60% to 65%. Wall motion was normal;   there were no regional wall motion abnormalities. Doppler   parameters are consistent with abnormal left ventricular   relaxation (grade 1 diastolic dysfunction). - Aortic valve: There was no stenosis. - Aorta: Borderline dilated aortic root. Aortic root dimension: 38   mm (ED). - Mitral valve: Mildly calcified annulus. There was no significant    regurgitation. Valve area by pressure half-time: 2.29 cm^2. - Right ventricle: The cavity size was normal. Systolic function   was normal. - Tricuspid valve: Peak RV-RA gradient (S): 27 mm Hg. - Pulmonary arteries: PA peak pressure: 30 mm Hg (S). - Inferior vena cava: The vessel was normal in size. The   respirophasic diameter changes  were in the normal range (>= 50%),   consistent with normal central venous pressure.   Labs/Other Tests and Data Reviewed:    EKG:  No ECG reviewed.  Recent Labs: 12/12/2018: ALT 17 12/27/2018: TSH 2.030 01/12/2019: Magnesium 1.9 02/24/2019: BUN 14; Creatinine, Ser 1.46; Hemoglobin 13.6; Platelets 237; Potassium 4.5; Sodium 142   Recent Lipid Panel Lab Results  Component Value Date/Time   CHOL 83 (L) 12/12/2018 11:35 AM   TRIG 159 (H) 12/12/2018 11:35 AM   HDL 34 (L) 12/12/2018 11:35 AM   CHOLHDL 2.4 12/12/2018 11:35 AM   CHOLHDL 3.0 04/27/2017 02:37 AM   LDLCALC 22 12/12/2018 11:35 AM    Wt Readings from Last 3 Encounters:  03/30/19 148 lb 9.6 oz (67.4 kg)  03/20/19 153 lb 6.4 oz (69.6 kg)  03/16/19 157 lb 6.4 oz (71.4 kg)     Objective:    Vital Signs:  BP 140/77    Pulse (!) 55    Ht 5\' 8"  (1.727 m)    Wt 148 lb 9.6 oz (67.4 kg)    BMI 22.59 kg/m    VITAL SIGNS:  reviewed GEN:  no acute distress NEURO:  alert and oriented x 3, no obvious focal deficit PSYCH:  normal affect  ASSESSMENT & PLAN:    1.  CAD: MI in 2008 requiring cardiac catheterization with PCI and DES to his circumflex, now on Brilinta.  He is not on aspirin as he is also on anticoagulation therapy with Eliquis.  He did not tolerate Plavix due to severe pain in his hands and feet.  Once this was discontinued his symptoms completely resolved.  I have added Plavix to his allergy list under intolerances.  I will not make any changes in his medication regimen today as he is stable and without complaint.  2.  Peripheral arterial disease: Angiography January 12, 2019 revealing  95% tandem mid to left FSA stenosis, and three-vessel runoff.  Status post arthrectomy followed by drug-coated balloon angioplasty.  He denies any recurrent symptoms of intermittent claudication.  Continue current medication regimen.  3.  Atrial fibrillation: Remains on Eliquis 5 mg twice daily.  He denies rapid heart rate, frequent palpitations, or associated dyspnea.  He remains on rate control with metoprolol succinate 25 mg daily.  As this is a telephone visit, no EKG was performed today.  4.  Hyperlipidemia: Remains on atorvastatin 80 mg.  Will need follow-up labs when seen again if not completed by PCP in the interim.  Goal of LDL less than 70.  COVID-19 Education: The signs and symptoms of COVID-19 were discussed with the patient and how to seek care for testing (follow up with PCP or arrange E-visit). The importance of social distancing was discussed today.  Time:   Today, I have spent 10 minutes with the patient with telehealth technology discussing the above problems.     Medication Adjustments/Labs and Tests Ordered: Current medicines are reviewed at length with the patient today.  Concerns regarding medicines are outlined above.   Tests Ordered: No orders of the defined types were placed in this encounter.   Medication Changes: No orders of the defined types were placed in this encounter.   Disposition:  Follow up with Dr. Tresa EndoKelly in April 2021   Signed, Bettey MareKathryn M. Liborio NixonLawrence DNP, ANP, AACC  03/30/2019 8:30 AM    [facility]

## 2019-03-30 ENCOUNTER — Encounter: Payer: Self-pay | Admitting: Adult Health

## 2019-03-30 ENCOUNTER — Telehealth (INDEPENDENT_AMBULATORY_CARE_PROVIDER_SITE_OTHER): Payer: BC Managed Care – PPO | Admitting: Adult Health

## 2019-03-30 VITALS — BP 140/77 | HR 55 | Ht 68.0 in | Wt 148.6 lb

## 2019-03-30 DIAGNOSIS — I739 Peripheral vascular disease, unspecified: Secondary | ICD-10-CM

## 2019-03-30 DIAGNOSIS — I10 Essential (primary) hypertension: Secondary | ICD-10-CM | POA: Diagnosis not present

## 2019-03-30 DIAGNOSIS — I4891 Unspecified atrial fibrillation: Secondary | ICD-10-CM | POA: Diagnosis not present

## 2019-03-30 DIAGNOSIS — Z7189 Other specified counseling: Secondary | ICD-10-CM

## 2019-03-30 DIAGNOSIS — Z7901 Long term (current) use of anticoagulants: Secondary | ICD-10-CM

## 2019-03-30 DIAGNOSIS — E785 Hyperlipidemia, unspecified: Secondary | ICD-10-CM

## 2019-03-30 DIAGNOSIS — I2510 Atherosclerotic heart disease of native coronary artery without angina pectoris: Secondary | ICD-10-CM

## 2019-03-30 DIAGNOSIS — Z720 Tobacco use: Secondary | ICD-10-CM

## 2019-03-30 DIAGNOSIS — I482 Chronic atrial fibrillation, unspecified: Secondary | ICD-10-CM

## 2019-03-30 DIAGNOSIS — Z9861 Coronary angioplasty status: Secondary | ICD-10-CM

## 2019-03-30 NOTE — Patient Instructions (Signed)
Medication Instructions:  Continue current medications  If you need a refill on your cardiac medications before your next appointment, please call your pharmacy.  Labwork: None Ordered   Testing/Procedures: None Ordered  Reduce your risk of getting COVID-19 With your heart disease it is especially important for people at increased risk of severe illness from COVID-19, and those who live with them, to protect themselves from getting COVID-19. The best way to protect yourself and to help reduce the spread of the virus that causes COVID-19 is to: Marland Kitchen Limit your interactions with other people as much as possible. . Take precautions to prevent getting COVID-19 when you do interact with others. If you start feeling sick and think you may have COVID-19, get in touch with your healthcare provider within 24 hours.  Follow-Up: IN 4 months Please call our office 2 months in advance,  to schedule this appointment. In Person Shelva Majestic, MD.    At [facility], you and your health needs are our priority.  As part of our continuing mission to provide you with exceptional heart care, we have created designated Provider Care Teams.  These Care Teams include your primary Cardiologist (physician) and Advanced Practice Providers (APPs -  Physician Assistants and Nurse Practitioners) who all work together to provide you with the care you need, when you need it.  Thank you for choosing [facility] at [facility]!!     Happy Holidays!!

## 2019-05-12 ENCOUNTER — Telehealth: Payer: Self-pay | Admitting: Cardiovascular Disease

## 2019-05-12 NOTE — Telephone Encounter (Signed)
Pt c/o medication issue:  1. Name of Medication:  ticagrelor (BRILINTA) 90 MG TABS tablet apixaban (ELIQUIS) 5 MG TABS tablet   2. How are you currently taking this medication (dosage and times per day)  BRILINTA - 1 tablet (90 mg total) by mouth 2 (two) times daily.  ELIQUIS - 1 tablet (5 mg total) by mouth 2 (two) times daily.   3. Are you having a reaction (difficulty breathing--STAT)? No    4. What is your medication issue? Medication is causing patient to experience dizziness and lightheadedness.

## 2019-05-12 NOTE — Telephone Encounter (Signed)
Spoke with pt and informed of the following from pharmD:  "Eliquis is NOT know to cause dizziness but Brilinta may cause dizziness.  Please contact cardiologist for substitution.  Note: already changed from Plavix to Brilinta d/t flushing and pain in his hands"  Informed pt that encounter to be routed to Dr. Tresa Endo and K. Lyman Bishop, DNP to advise. Pt states he wanted to mention that he has been 'swimmyheaded' and SOB for about 1 week and that he gets lightheaded when he bends down or gets out of bed quickly. Pt states he drinks Coke with his Brilinta to help with feeling 'swimmyheaded' but this does not really help. Pt checked vitals while on phone. Reports BP 134/69 and HR 61. Advised to report to ED if SOB increases and he has trouble breathing. Virtual appt set up with Harriet Pho on 2/1 at 11:15 am.

## 2019-05-12 NOTE — Telephone Encounter (Signed)
Eliquis is NOT know to cause dizziness but Brilinta may cause dizziness.  Please contact cardiologist for substitution.  Note: already changed from Plavix to Brilinta d/t flushing and pain in his hands

## 2019-05-13 NOTE — Progress Notes (Signed)
Virtual Visit via Telephone Note   This visit type was conducted due to national recommendations for restrictions regarding the COVID-19 Pandemic (e.g. social distancing) in an effort to limit this patient's exposure and mitigate transmission in our community.  Due to his co-morbid illnesses, this patient is at least at moderate risk for complications without adequate follow up.  This format is felt to be most appropriate for this patient at this time.  The patient did not have access to video technology/had technical difficulties with video requiring transitioning to audio format only (telephone).  All issues noted in this document were discussed and addressed.  No physical exam could be performed with this format.  Please refer to the patient's chart for his  consent to telehealth for [facility].   Date:  05/15/2019   ID:  Ralph Dawson, DOB 09-28-1940, MRN [id number]  Patient Location: Home Provider Location: Home  PCP:  Patient, No Pcp Per  Cardiologist:  Nicki Guadalajara, MD  Electrophysiologist:  None   Evaluation Performed:  Follow-Up Visit  Chief Complaint:  Lightheaded   History of Present Illness:    Ralph Dawson is a 79 y.o. male we are following for ongoing assessment and management of coronary artery disease, with MI in 2018 requiring cardiac catheterization and PCI with DES to his circumflex.  He also has a history of PAD with peripheral angiography on 01/12/2019 revealing 95% tandem mid to left FSA stenosis, with three-vessel runoff.  The patient required atherectomy followed by drug coated balloon angioplasty.  He also had a history of atrial fibrillation versus atrial flutter post procedure.   He was not tolerating clopidogrel and Brilinta.  At that office visit I took him off of clopidogrel, and restarted him back on Brilinta as he did not have any side effects from that concerning pain in his hands and feet. On follow up visit 03/30/2019 he was much improved on his  symptoms.   He called our office on 05/12/2019 reporting that he has been getting "swimmyheaded' and short of breath for one week, getting lightheaded when he bends down or gets out of bed quickly. He was advised that Brilinta may cause dyspnea, but he did not tolerate Plavix and therefore this was not changed.   He states today that he is feeling better today.  He was experiencing sinus congestion and pressure. Lots of allergy symptoms. Taking a sinus/allergy tablet was helpful. He also needed an appointment for new glasses. He was experiencing problems with is glasses. Wearing new glasses now, with resolution of symptoms.   The patient does not have symptoms concerning for COVID-19 infection (fever, chills, cough, or new shortness of breath).    Past Medical History:  Diagnosis Date  . Arthritis    "hands, feet" (04/27/2017)  . Atrial fibrillation/flutter (HCC)    a. during admission 01/2019 for PAD angiogram - isolated event after allergic reaction/steroids, monitor planned.  . Bradycardia    a. brief episode of nocturnal bradycardia 01/2019 (?NSR with blocked PACs).  Marland Kitchen CAD (coronary artery disease)   . Carotid stenosis 09/24/2011   R ICA patent w/ hx of endarterectomy, stenosis 1-39%;  L ICA patent w/ hx of endarterectomy; external carotids appear patent; see imaging tab for full report  . Chest heaviness 02/25/2009   PVCs, atrial bigeminy  . CKD (chronic kidney disease), stage III   . Claudication (HCC) 06/17/2011   LE doppler - bilateral ABIs normal values at rest; R CIA >50% diameter reduction L CIA  0-49% reduction; bilateral SFAs mild/mod mixed density plaque throughout suggesting 50-69% diameter reduction  . Contrast media allergy   . GERD (gastroesophageal reflux disease)   . Hyperlipidemia   . Hypertension 02/13/2010   echo - EF >55%; mild mitral annular calcification; mild aortic valve sclerosis  . NSTEMI (non-ST elevated myocardial infarction) ([place]) 04/26/2017   Ralph Dawson 04/27/2017   . PAD (peripheral artery disease) ([place])    a. s/p LLE intervention on 01/2019.  Marland Kitchen RBBB (right bundle branch block) 02/13/2010   R/P MV - EF 67%; normal perfusion all regions; no significant wall abnormalties noted  . Tobacco abuse    Past Surgical History:  Procedure Laterality Date  . ABDOMINAL AORTOGRAM W/LOWER EXTREMITY Bilateral 01/12/2019   Procedure: ABDOMINAL AORTOGRAM W/LOWER EXTREMITY;  Surgeon: Lorretta Harp, MD;  Location: [facility];  Service: Cardiovascular;  Laterality: Bilateral;  . APPENDECTOMY    . ARTERIAL BYPASS SURGRY     pt unaware of this OR on 04/27/2017  . BACK SURGERY    . CAROTID ENDARTERECTOMY Right 07/15/2009   Right CEA  . CAROTID ENDARTERECTOMY Left 12/13/2007   Left CEA  . CORONARY ANGIOPLASTY WITH STENT PLACEMENT  04/27/2017  . CORONARY STENT INTERVENTION N/A 04/27/2017   Procedure: CORONARY STENT INTERVENTION;  Surgeon: Jettie Booze, MD;  Location: [facility];  Service: Cardiovascular;  Laterality: N/A;  . LAPAROSCOPIC CHOLECYSTECTOMY    . LEFT HEART CATH AND CORONARY ANGIOGRAPHY N/A 04/27/2017   Procedure: LEFT HEART CATH AND CORONARY ANGIOGRAPHY;  Surgeon: Jettie Booze, MD;  Location: [facility];  Service: Cardiovascular;  Laterality: N/A;  . LUMBAR Barrett    . PERIPHERAL VASCULAR ATHERECTOMY Left 01/12/2019   Procedure: PERIPHERAL VASCULAR ATHERECTOMY;  Surgeon: Lorretta Harp, MD;  Location: [facility];  Service: Cardiovascular;  Laterality: Left;  SFA  . TONSILLECTOMY       Current Meds  Medication Sig  . acetaminophen (TYLENOL) 650 MG CR tablet Take 1 tablet (650 mg total) by mouth every 8 (eight) hours as needed for pain.  Marland Kitchen albuterol (PROVENTIL HFA;VENTOLIN HFA) 108 (90 Base) MCG/ACT inhaler Inhale 1-2 puffs into the lungs every 6 (six) hours as needed (wheezing/shortness of breath.).   Marland Kitchen apixaban (ELIQUIS) 5 MG TABS tablet Take 1 tablet (5 mg total) by mouth 2 (two) times daily.  Marland Kitchen  atorvastatin (LIPITOR) 80 MG tablet TAKE 1 TABLET BY MOUTH EVERY DAY AT 6PM (Patient taking differently: Take 80 mg by mouth at bedtime. )  . diclofenac Sodium (VOLTAREN) 1 % GEL Apply 2 g topically 4 (four) times daily.  Marland Kitchen donepezil (ARICEPT) 5 MG tablet Take 5 mg by mouth at bedtime.   . DULoxetine (CYMBALTA) 60 MG capsule Take 60 mg by mouth at bedtime.   Marland Kitchen ipratropium (ATROVENT) 0.06 % nasal spray Place 1 spray into the nose 3 (three) times daily as needed (allergies/congestion.).   Marland Kitchen isosorbide dinitrate (ISORDIL) 30 MG tablet Take 15 mg by mouth at bedtime.   Marland Kitchen lisinopril (ZESTRIL) 5 MG tablet Take 5 mg by mouth at bedtime.  . Menthol-Methyl Salicylate (MUSCLE RUB) 10-15 % CREA Apply 1 application topically 4 (four) times daily as needed for muscle pain.   . metoprolol succinate (TOPROL XL) 25 MG 24 hr tablet Take 1 tablet (25 mg total) by mouth daily.  . nitroGLYCERIN (NITROSTAT) 0.4 MG SL tablet Place 1 tablet (0.4 mg total) under the tongue every 5 (five) minutes as needed for chest pain.  Marland Kitchen  omeprazole (PRILOSEC) 20 MG capsule Take 20 mg by mouth at bedtime.  . tamsulosin (FLOMAX) 0.4 MG CAPS Take 0.4 mg by mouth at bedtime.   . ticagrelor (BRILINTA) 90 MG TABS tablet Take 1 tablet (90 mg total) by mouth 2 (two) times daily.     Allergies:   Plavix [clopidogrel] and Contrast media [iodinated diagnostic agents]   Social History   Tobacco Use  . Smoking status: Current Some Day Smoker    Packs/day: 0.25    Years: 65.00    Pack years: 16.25    Types: Cigarettes    Last attempt to quit: 06/14/2017    Years since quitting: 1.9  . Smokeless tobacco: Never Used  . Tobacco comment: smoking a 10 cigarettes a week   Substance Use Topics  . Alcohol use: No  . Drug use: No     Family Hx: The patient's family history includes Cancer in his daughter, mother, and sister; Diabetes in his brother.  ROS:   Please see the history of present illness.    All other systems reviewed and are  negative.   Prior CV studies:   The following studies were reviewed today: LHC April 30, 2017   Prox LAD lesion is 40% stenosed.  Ost Cx to Prox Cx lesion is 25% stenosed.  Mid LAD lesion is 50% stenosed.  The left ventricular systolic function is normal.  LV end diastolic pressure is normal.  The left ventricular ejection fraction is 55-65% by visual estimate.  There is no aortic valve stenosis.  Mid Cx lesion is 95% stenosed.  A drug-eluting stent was successfully placed using a STENT SYNERGY DES 3.5X16.  Post intervention, there is a 0% residual stenosis.  Culprit circumflex lesion stented. Double wire was needed to get balloon around proximal bend. Guideliner was needed to get the stent around the proximal bend. Would use EBU 3.5 Guide if cath needed in the future as EBU 3 did not provide great support.   COntinue DAPT for 1 year along with aggressive secondary prevention including smoking cessation.   Labs/Other Tests and Data Reviewed:    EKG:  No ECG reviewed.  Recent Labs: 12/12/2018: ALT 17 12/27/2018: TSH 2.030 01/12/2019: Magnesium 1.9 02/24/2019: BUN 14; Creatinine, Ser 1.46; Hemoglobin 13.6; Platelets 237; Potassium 4.5; Sodium 142   Recent Lipid Panel Lab Results  Component Value Date/Time   CHOL 83 (L) 12/12/2018 11:35 AM   TRIG 159 (H) 12/12/2018 11:35 AM   HDL 34 (L) 12/12/2018 11:35 AM   CHOLHDL 2.4 12/12/2018 11:35 AM   CHOLHDL 3.0 Apr 30, 2017 02:37 AM   LDLCALC 22 12/12/2018 11:35 AM    Wt Readings from Last 3 Encounters:  05/15/19 148 lb (67.1 kg)  03/30/19 148 lb 9.6 oz (67.4 kg)  03/20/19 153 lb 6.4 oz (69.6 kg)     Objective:    Vital Signs:  BP 134/76   Pulse (!) 56   Ht 5\' 8"  (1.727 m)   Wt 148 lb (67.1 kg)   BMI 22.50 kg/m    VITAL SIGNS:  reviewed GEN:  no acute distress RESPIRATORY:  normal respiratory effort, symmetric expansion NEURO:  alert and oriented x 3, no obvious focal deficit PSYCH:  normal affect  ASSESSMENT  & PLAN:    1. Dizziness and Light-headness: Symptoms have resolved with sinus medications and with new glasses. He did state that he was having some dizziness when he turned his head, but no near syncope.  Due to his PAD, smoking, and other risk factors,  I will have carotid doppler studies completed for better evaluation. He is to call if symptoms worsen.   2. CAD:  MI in 2018, PCI with DES to Cx.  He continues on Brilinta and ASA, is intolerant to Plavix. He will continue secondary risk management.  3.  PAD: S/P atherectomy to the mid FSA on the left with DES angioplasty.  Continues on DAPT. No complaints of intermittent claudication.   4. Ongoing tobacco abuse: He has been counseled many tines in the past He is aware of the risks of progressive CAD, PAD, and lung disease. He is not inclined to quit at this time.   5. Hyperlipidemia: Currently on atorvastatin 80 mg daily. Goal of LDL < 70 Do not see recent labs. He will have these drawn on next appt with Dr  Tresa Dawson, scheduled in 3 months if not completed by PCP.   COVID-19 Education: The signs and symptoms of COVID-19 were discussed with the patient and how to seek care for testing (follow up with PCP or arrange E-visit). The importance of social distancing was discussed today.He refuses COVID vaccine.   Time:   Today, I have spent 15 minutes with the patient with telehealth technology discussing the above problems.     Medication Adjustments/Labs and Tests Ordered: Current medicines are reviewed at length with the patient today.  Concerns regarding medicines are outlined above.   Tests Ordered: Bilateral doppler ultrasound.  Medication Changes: No orders of the defined types were placed in this encounter.   Disposition:  Follow up 3 months.   Signed, Bettey Mare. Liborio Nixon, ANP, [facility]  05/15/2019 11:29 AM    [facility]

## 2019-05-15 ENCOUNTER — Telehealth (INDEPENDENT_AMBULATORY_CARE_PROVIDER_SITE_OTHER): Payer: BC Managed Care – PPO | Admitting: Adult Health

## 2019-05-15 VITALS — BP 134/76 | HR 56 | Ht 68.0 in | Wt 148.0 lb

## 2019-05-15 DIAGNOSIS — R42 Dizziness and giddiness: Secondary | ICD-10-CM

## 2019-05-15 DIAGNOSIS — Z720 Tobacco use: Secondary | ICD-10-CM

## 2019-05-15 DIAGNOSIS — I739 Peripheral vascular disease, unspecified: Secondary | ICD-10-CM

## 2019-05-15 DIAGNOSIS — I2510 Atherosclerotic heart disease of native coronary artery without angina pectoris: Secondary | ICD-10-CM

## 2019-05-15 DIAGNOSIS — E785 Hyperlipidemia, unspecified: Secondary | ICD-10-CM

## 2019-05-15 DIAGNOSIS — I10 Essential (primary) hypertension: Secondary | ICD-10-CM

## 2019-05-15 NOTE — Telephone Encounter (Signed)
Pt had OV today with Joni Reining

## 2019-05-15 NOTE — Patient Instructions (Signed)
Medication Instructions:  Continue current medications  *If you need a refill on your cardiac medications before your next appointment, please call your pharmacy*  Lab Work: None Ordered  Testing/Procedures: Your physician has requested that you have a carotid duplex. This test is an ultrasound of the carotid arteries in your neck. It looks at blood flow through these arteries that supply the brain with blood. Allow one hour for this exam. There are no restrictions or special instructions.  Follow-Up: At [facility], you and your health needs are our priority.  As part of our continuing mission to provide you with exceptional heart care, we have created designated Provider Care Teams.  These Care Teams include your primary Cardiologist (physician) and Advanced Practice Providers (APPs -  Physician Assistants and Nurse Practitioners) who all work together to provide you with the care you need, when you need it.  Your next appointment:   3 month(s)  The format for your next appointment:   In Person  Provider:   Nicki Guadalajara, MD

## 2019-05-16 ENCOUNTER — Other Ambulatory Visit (HOSPITAL_COMMUNITY): Payer: Self-pay | Admitting: Adult Health

## 2019-05-16 ENCOUNTER — Other Ambulatory Visit: Payer: Self-pay

## 2019-05-16 ENCOUNTER — Ambulatory Visit (HOSPITAL_COMMUNITY)
Admission: RE | Admit: 2019-05-16 | Discharge: 2019-05-16 | Disposition: A | Discharge status: Home / Self Care | Payer: BC Managed Care – PPO | Source: Ambulatory Visit | Attending: Internal Medicine | Admitting: Internal Medicine

## 2019-05-16 DIAGNOSIS — I739 Peripheral vascular disease, unspecified: Secondary | ICD-10-CM | POA: Diagnosis not present

## 2019-05-16 DIAGNOSIS — Z9889 Other specified postprocedural states: Secondary | ICD-10-CM

## 2019-05-16 DIAGNOSIS — R42 Dizziness and giddiness: Secondary | ICD-10-CM | POA: Diagnosis not present

## 2019-06-21 DIAGNOSIS — I480 Paroxysmal atrial fibrillation: Secondary | ICD-10-CM | POA: Diagnosis not present

## 2019-06-21 DIAGNOSIS — I10 Essential (primary) hypertension: Secondary | ICD-10-CM | POA: Diagnosis not present

## 2019-06-21 DIAGNOSIS — E785 Hyperlipidemia, unspecified: Secondary | ICD-10-CM | POA: Diagnosis not present

## 2019-06-21 DIAGNOSIS — Z720 Tobacco use: Secondary | ICD-10-CM | POA: Diagnosis not present

## 2019-06-21 DIAGNOSIS — K219 Gastro-esophageal reflux disease without esophagitis: Secondary | ICD-10-CM | POA: Diagnosis not present

## 2019-06-21 DIAGNOSIS — I2510 Atherosclerotic heart disease of native coronary artery without angina pectoris: Secondary | ICD-10-CM | POA: Diagnosis not present

## 2019-06-21 DIAGNOSIS — R05 Cough: Secondary | ICD-10-CM | POA: Diagnosis not present

## 2019-07-28 ENCOUNTER — Ambulatory Visit: Payer: BC Managed Care – PPO | Admitting: Cardiovascular Disease

## 2019-07-28 ENCOUNTER — Ambulatory Visit (HOSPITAL_COMMUNITY)
Admission: RE | Admit: 2019-07-28 | Payer: BC Managed Care – PPO | Source: Ambulatory Visit | Attending: Cardiovascular Disease | Admitting: Cardiovascular Disease

## 2019-07-28 ENCOUNTER — Encounter (HOSPITAL_COMMUNITY): Payer: BC Managed Care – PPO

## 2019-08-30 ENCOUNTER — Other Ambulatory Visit: Payer: Self-pay | Admitting: Adult Health

## 2019-08-30 NOTE — Telephone Encounter (Signed)
28m 69.6kg Scr 1.46 02/24/19 lovw/ lawrence 05/15/19 Refill sent

## 2019-11-10 ENCOUNTER — Ambulatory Visit: Payer: BC Managed Care – PPO | Admitting: Cardiovascular Disease

## 2019-12-05 DIAGNOSIS — E782 Mixed hyperlipidemia: Secondary | ICD-10-CM | POA: Diagnosis not present

## 2019-12-05 DIAGNOSIS — Z720 Tobacco use: Secondary | ICD-10-CM | POA: Diagnosis not present

## 2019-12-05 DIAGNOSIS — I10 Essential (primary) hypertension: Secondary | ICD-10-CM | POA: Diagnosis not present

## 2019-12-05 DIAGNOSIS — I2510 Atherosclerotic heart disease of native coronary artery without angina pectoris: Secondary | ICD-10-CM | POA: Diagnosis not present

## 2019-12-05 DIAGNOSIS — I480 Paroxysmal atrial fibrillation: Secondary | ICD-10-CM | POA: Diagnosis not present

## 2019-12-26 DIAGNOSIS — I10 Essential (primary) hypertension: Secondary | ICD-10-CM | POA: Diagnosis not present

## 2019-12-26 DIAGNOSIS — R42 Dizziness and giddiness: Secondary | ICD-10-CM | POA: Diagnosis not present

## 2019-12-26 DIAGNOSIS — H938X3 Other specified disorders of ear, bilateral: Secondary | ICD-10-CM | POA: Diagnosis not present

## 2020-01-12 DIAGNOSIS — J40 Bronchitis, not specified as acute or chronic: Secondary | ICD-10-CM | POA: Diagnosis not present

## 2020-02-19 ENCOUNTER — Telehealth: Payer: BC Managed Care – PPO | Admitting: Physician Assistant

## 2020-02-27 NOTE — Progress Notes (Signed)
Virtual Visit via Telephone Note   This visit type was conducted due to national recommendations for restrictions regarding the COVID-19 Pandemic (e.g. social distancing) in an effort to limit this patient's exposure and mitigate transmission in our community.  Due to his co-morbid illnesses, this patient is at least at moderate risk for complications without adequate follow up.  This format is felt to be most appropriate for this patient at this time.  The patient did not have access to video technology/had technical difficulties with video requiring transitioning to audio format only (telephone).  All issues noted in this document were discussed and addressed.  No physical exam could be performed with this format.  Please refer to the patient's chart for his  consent to telehealth for [facility].    Date:  02/28/2020   ID:  Ralph Dawson, DOB [birth year], MRN [id number] The patient was identified using 2 identifiers.  Patient Location: Home Provider Location: Office/Clinic  PCP:  Eartha Inch, MD  Cardiologist:  Nicki Guadalajara, MD  Electrophysiologist:  None   Evaluation Performed:  Follow-Up Visit  Chief Complaint:  CAD, PAF  History of Present Illness:    Ralph Dawson is a 79 y.o. male with a history of PAF, RBBB, HTN, CAD, CKD stage III, HLD, and PVD. He has a history of bilateral CEA. He had MI in 2018 treated with DES to Cx. In 01/12/19, he underwent atherectomy with drug coated balloon angioplasty for 95% tandem mid to left SFA stenosis. He had Afib/flutter post procedure thought to be related to allergic reaction and steroids. Zio patch did show Afib with RVR and he was started on eliquis 5 mg BID. He has a history of pain in his hands and feet on plavix, switched to brilinta with resolution of symptoms. He saw Joni Reining NP 05/15/19. Dizziness and lightheadedness had resolves with sinus medication and new glasses. Carotid dopplers were completed and showed no  significant disease.   He presents for routine follow up. He denies bleeding on brilinta and eliquis. He denies chest pain and syncope. 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 Overall, he reports doing well. He does report that eliquis will no longer be covered on his insurance starting Apr 13 2020. Pressure elevated because he just woke up (152/77) - retake is 129/67 HR 57. He reports that he stopped taking isordil - was taking 15 mg nightly - but doesn't know why. His pressure fluctuates 120-160s throughout the day.   The patient does not have symptoms concerning for COVID-19 infection (fever, chills, cough, or new shortness of breath).    Past Medical History:  Diagnosis Date   Arthritis    "hands, feet" (04/27/2017)   Atrial fibrillation/flutter    a. during admission 01/2019 for PAD angiogram - isolated event after allergic reaction/steroids, monitor planned.   Bradycardia    a. brief episode of nocturnal bradycardia 01/2019 (?NSR with blocked PACs).   CAD (coronary artery disease)    Carotid stenosis 09/24/2011   R ICA patent w/ hx of endarterectomy, stenosis 1-39%;  L ICA patent w/ hx of endarterectomy; external carotids appear patent; see imaging tab for full report   Chest heaviness 02/25/2009   PVCs, atrial bigeminy   CKD (chronic kidney disease), stage III (HCC)    Claudication (HCC) 06/17/2011   LE doppler - bilateral ABIs normal values at rest; R CIA >50% diameter reduction L CIA 0-49% reduction; bilateral SFAs mild/mod mixed density plaque throughout suggesting 50-69% diameter reduction  Contrast media allergy    GERD (gastroesophageal reflux disease)    Hyperlipidemia    Hypertension 02/13/2010   echo - EF >55%; mild mitral annular calcification; mild aortic valve sclerosis   NSTEMI (non-ST elevated myocardial infarction) (HCC) 04/26/2017   [name]/notes 04/27/2017   PAD (peripheral artery disease) (HCC)    a. s/p LLE intervention on 01/2019.   RBBB (right bundle  branch block) 02/13/2010   R/P MV - EF 67%; normal perfusion all regions; no significant wall abnormalties noted   Tobacco abuse    Past Surgical History:  Procedure Laterality Date   ABDOMINAL AORTOGRAM W/LOWER EXTREMITY Bilateral 01/12/2019   Procedure: ABDOMINAL AORTOGRAM W/LOWER EXTREMITY;  Surgeon: Runell GessBerry, Jonathan J, MD;  Location: [facility];  Service: Cardiovascular;  Laterality: Bilateral;   APPENDECTOMY     ARTERIAL BYPASS SURGRY     pt unaware of this OR on 04/27/2017   BACK SURGERY     CAROTID ENDARTERECTOMY Right 07/15/2009   Right CEA   CAROTID ENDARTERECTOMY Left 12/13/2007   Left CEA   CORONARY ANGIOPLASTY WITH STENT PLACEMENT  04/27/2017   CORONARY STENT INTERVENTION N/A 04/27/2017   Procedure: CORONARY STENT INTERVENTION;  Surgeon: Corky CraftsVaranasi, Jayadeep S, MD;  Location: [facility];  Service: Cardiovascular;  Laterality: N/A;   LAPAROSCOPIC CHOLECYSTECTOMY     LEFT HEART CATH AND CORONARY ANGIOGRAPHY N/A 04/27/2017   Procedure: LEFT HEART CATH AND CORONARY ANGIOGRAPHY;  Surgeon: Corky CraftsVaranasi, Jayadeep S, MD;  [facility];  Service: Cardiovascular;  Laterality: N/A;   LUMBAR DISC SURGERY     PERIPHERAL VASCULAR ATHERECTOMY Left 01/12/2019   Procedure: PERIPHERAL VASCULAR ATHERECTOMY;  Surgeon: Runell GessBerry, Jonathan J, MD;  Location: [facility];  Service: Cardiovascular;  Laterality: Left;  SFA   TONSILLECTOMY       Current Meds  Medication Sig   acetaminophen (TYLENOL) 650 MG CR tablet Take 1 tablet (650 mg total) by mouth every 8 (eight) hours as needed for pain.   atorvastatin (LIPITOR) 80 MG tablet TAKE 1 TABLET BY MOUTH EVERY DAY AT 6PM (Patient taking differently: Take 80 mg by mouth at bedtime. )   diclofenac Sodium (VOLTAREN) 1 % GEL Apply 2 g topically 4 (four) times daily.   donepezil (ARICEPT) 5 MG tablet Take 5 mg by mouth at bedtime.    DULoxetine (CYMBALTA) 60 MG capsule Take 60 mg by mouth at bedtime.     ipratropium (ATROVENT) 0.06 % nasal spray Place 1 spray into the nose 3 (three) times daily as needed (allergies/congestion.).    lisinopril (ZESTRIL) 5 MG tablet Take 5 mg by mouth at bedtime.   Menthol-Methyl Salicylate (MUSCLE RUB) 10-15 % CREA Apply 1 application topically 4 (four) times daily as needed for muscle pain.    metoprolol succinate (TOPROL XL) 25 MG 24 hr tablet Take 1 tablet (25 mg total) by mouth daily.   nitroGLYCERIN (NITROSTAT) 0.4 MG SL tablet Place 1 tablet (0.4 mg total) under the tongue every 5 (five) minutes as needed for chest pain.   omeprazole (PRILOSEC) 20 MG capsule Take 20 mg by mouth at bedtime.   tamsulosin (FLOMAX) 0.4 MG CAPS Take 0.4 mg by mouth at bedtime.    ticagrelor (BRILINTA) 90 MG TABS tablet Take 1 tablet (90 mg total) by mouth 2 (two) times daily.   [DISCONTINUED] ELIQUIS 5 MG TABS tablet TAKE 1 TABLET BY MOUTH TWICE A DAY     Allergies:   Plavix [clopidogrel] and Contrast media [iodinated diagnostic  agents]   Social History   Tobacco Use   Smoking status: Current Some Day Smoker    Packs/day: 0.25    Years: 65.00    Pack years: 16.25    Types: Cigarettes    Last attempt to quit: 06/14/2017    Years since quitting: 2.7   Smokeless tobacco: Never Used   Tobacco comment: smoking a 10 cigarettes a week   Vaping Use   Vaping Use: Some days  Substance Use Topics   Alcohol use: No   Drug use: No     Family Hx: The patient's family history includes Cancer in his daughter, mother, and sister; Diabetes in his brother.  ROS:   Please see the history of present illness.     All other systems reviewed and are negative.   Prior CV studies:   The following studies were reviewed today:  Carotid artery duplex 05/16/19 Right Carotid: Velocities in the right ICA are consistent with a 1-39% stenosis. S/p CEA.   Left Carotid: Velocities in the left ICA are consistent with a 1-39% stenosis. S/p CEA.   Vertebrals: Left vertebral  artery demonstrates antegrade flow. Right  vertebral artery demonstrates high resistant flow.  Subclavians: Normal flow hemodynamics were seen in bilateral subclavian arteries.   Labs/Other Tests and Data Reviewed:    EKG:  No ECG reviewed.  Recent Labs: No results found for requested labs within last 8760 hours.   Recent Lipid Panel Lab Results  Component Value Date/Time   CHOL 83 (L) 12/12/2018 11:35 AM   TRIG 159 (H) 12/12/2018 11:35 AM   HDL 34 (L) 12/12/2018 11:35 AM   CHOLHDL 2.4 12/12/2018 11:35 AM   CHOLHDL 3.0 04/27/2017 02:37 AM   LDLCALC 22 12/12/2018 11:35 AM    Wt Readings from Last 3 Encounters:  02/28/20 152 lb (68.9 kg)  05/15/19 148 lb (67.1 kg)  03/30/19 148 lb 9.6 oz (67.4 kg)     Risk Assessment/Calculations:     CHA2DS2-VASc Score = 4  This indicates a 4.8% annual risk of stroke. The patient's score is based upon: CHF History: 0 HTN History: 1 Diabetes History: 0 Stroke History: 0 Vascular Disease History: 1 Age Score: 2 Gender Score: 0     Objective:    Vital Signs:  BP 129/67    Pulse (!) 57    Temp 97.6 F (36.4 C)    Ht 5\' 8"  (1.727 m)    Wt 152 lb (68.9 kg)    BMI 23.11 kg/m    VITAL SIGNS:  reviewed GEN:  no acute distress RESPIRATORY:  respirations unlabored NEURO:  alert and oriented x 3, no obvious focal deficit PSYCH:  normal affect  ASSESSMENT & PLAN:    CAD NSTEMI 2018 DES to CX - brilinta monotherapy in the setting of eliquis - has been intolerant to plavix in the past - no chest pain - continue BB, lisinopril, statin - he stopped taking isordil but doesn't know why - pressure fluctuates from 120-160 throughout the day - given his eliquis/brilinta and age, I am not inclined to restart isordil at this time, but will continue to monitor - question if he would be a candidate for lower dose of briltinta 60 mg BID - will defer to Dr. 2019   PAF with RVR - on monitor - continue eliquis This patients CHA2DS2-VASc  Score and unadjusted Ischemic Stroke Rate (% per year) is equal to 4.8 % stroke rate/year from a score of 4 (2age, HTN, CAD) - insurance  will no longer cover eliquis --> will send this to prior authorization    Carotid artery stenosis - last dopplers 05/2019 - no significant disease - no recent syncope or falls   PAD - s/p atherectomy left mid SFA - continue brilinta - no claudication   Hyperlipidemia with LDL goal < 70 - continue 80 mg lipitor - need updated lipid panel - was checked with PCP   Hypertension - pressure well-controlled - no medication changes   Ongoing tobacco abuse - he knows he needs to quit - has no desire to quit or cut back    COVID-19 Education: The signs and symptoms of COVID-19 were discussed with the patient and how to seek care for testing (follow up with PCP or arrange E-visit).  The importance of social distancing was discussed today.  Time:   Today, I have spent 22 minutes with the patient with telehealth technology discussing the above problems.     Medication Adjustments/Labs and Tests Ordered: Current medicines are reviewed at length with the patient today.  Concerns regarding medicines are outlined above.   Tests Ordered: No orders of the defined types were placed in this encounter.   Medication Changes: No orders of the defined types were placed in this encounter.   Follow Up:  In Person in 6 month(s)  Signed, Marcelino Duster, [place]  02/28/2020 12:05 PM    [facility]

## 2020-02-28 ENCOUNTER — Other Ambulatory Visit: Payer: Self-pay | Admitting: Physician Assistant

## 2020-02-28 ENCOUNTER — Encounter: Payer: Self-pay | Admitting: Physician Assistant

## 2020-02-28 ENCOUNTER — Telehealth (INDEPENDENT_AMBULATORY_CARE_PROVIDER_SITE_OTHER): Payer: BC Managed Care – PPO | Admitting: Physician Assistant

## 2020-02-28 VITALS — BP 129/67 | HR 57 | Temp 97.6°F | Ht 68.0 in | Wt 152.0 lb

## 2020-02-28 DIAGNOSIS — I2510 Atherosclerotic heart disease of native coronary artery without angina pectoris: Secondary | ICD-10-CM | POA: Diagnosis not present

## 2020-02-28 DIAGNOSIS — I214 Non-ST elevation (NSTEMI) myocardial infarction: Secondary | ICD-10-CM

## 2020-02-28 DIAGNOSIS — I6523 Occlusion and stenosis of bilateral carotid arteries: Secondary | ICD-10-CM

## 2020-02-28 DIAGNOSIS — I10 Essential (primary) hypertension: Secondary | ICD-10-CM

## 2020-02-28 DIAGNOSIS — I482 Chronic atrial fibrillation, unspecified: Secondary | ICD-10-CM

## 2020-02-28 DIAGNOSIS — Z720 Tobacco use: Secondary | ICD-10-CM

## 2020-02-28 DIAGNOSIS — E785 Hyperlipidemia, unspecified: Secondary | ICD-10-CM

## 2020-02-28 DIAGNOSIS — I739 Peripheral vascular disease, unspecified: Secondary | ICD-10-CM

## 2020-02-28 MED ORDER — APIXABAN 5 MG PO TABS: 5.0000 mg | ORAL_TABLET | Freq: Two times a day (BID) | ORAL | 3 refills | Status: DC

## 2020-02-28 NOTE — Patient Instructions (Signed)
Medication Instructions:  No changes *If you need a refill on your cardiac medications before your next appointment, please call your pharmacy*   Lab Work: No Labs  If you have labs (blood work) drawn today and your tests are completely normal, you will receive your results only by:  MyChart Message (if you have MyChart) OR  A paper copy in the mail If you have any lab test that is abnormal or we need to change your treatment, we will call you to review the results.   Testing/Procedures: No Testing Ordered   Follow-Up: At [facility], you and your health needs are our priority.  As part of our continuing mission to provide you with exceptional heart care, we have created designated Provider Care Teams.  These Care Teams include your primary Cardiologist (physician) and Advanced Practice Providers (APPs -  Physician Assistants and Nurse Practitioners) who all work together to provide you with the care you need, when you need it.  We recommend signing up for the patient portal called "MyChart".  Sign up information is provided on this After Visit Summary.  MyChart is used to connect with patients for Virtual Visits (Telemedicine).  Patients are able to view lab/test results, encounter notes, upcoming appointments, etc.  Non-urgent messages can be sent to your provider as well.   To learn more about what you can do with MyChart, go to [email or web address].    Your next appointment:   4-6 months  The format for your next appointment:   In Person  Provider:   Nicki Guadalajara, MD

## 2020-05-08 DIAGNOSIS — J029 Acute pharyngitis, unspecified: Secondary | ICD-10-CM | POA: Diagnosis not present

## 2020-05-08 DIAGNOSIS — R059 Cough, unspecified: Secondary | ICD-10-CM | POA: Diagnosis not present

## 2020-05-08 DIAGNOSIS — J069 Acute upper respiratory infection, unspecified: Secondary | ICD-10-CM | POA: Diagnosis not present

## 2020-05-13 ENCOUNTER — Telehealth: Payer: Self-pay

## 2020-05-13 NOTE — Telephone Encounter (Signed)
Prior authorization for Eliquis 5 mg received from patient's insurance company via [email or web address].  CMM Key: [id number]  Message sent to PA department for processing.

## 2020-05-14 NOTE — Telephone Encounter (Signed)
PA submitted for Eliquis 

## 2020-05-14 NOTE — Telephone Encounter (Signed)
PA for Eliquis approved.

## 2020-05-15 ENCOUNTER — Ambulatory Visit (HOSPITAL_COMMUNITY)
Admission: RE | Admit: 2020-05-15 | Payer: BC Managed Care – PPO | Source: Ambulatory Visit | Attending: Adult Health | Admitting: Adult Health

## 2020-05-15 DIAGNOSIS — H938X3 Other specified disorders of ear, bilateral: Secondary | ICD-10-CM | POA: Diagnosis not present

## 2020-05-15 DIAGNOSIS — I4891 Unspecified atrial fibrillation: Secondary | ICD-10-CM | POA: Diagnosis not present

## 2020-05-15 DIAGNOSIS — J449 Chronic obstructive pulmonary disease, unspecified: Secondary | ICD-10-CM | POA: Diagnosis not present

## 2020-05-15 DIAGNOSIS — U071 COVID-19: Secondary | ICD-10-CM | POA: Diagnosis not present

## 2020-05-17 DIAGNOSIS — H6503 Acute serous otitis media, bilateral: Secondary | ICD-10-CM | POA: Diagnosis not present

## 2020-05-17 DIAGNOSIS — H6122 Impacted cerumen, left ear: Secondary | ICD-10-CM | POA: Diagnosis not present

## 2020-06-10 ENCOUNTER — Other Ambulatory Visit: Payer: Self-pay | Admitting: Adult Health

## 2020-06-12 ENCOUNTER — Other Ambulatory Visit: Payer: Self-pay

## 2020-06-12 MED ORDER — TICAGRELOR 90 MG PO TABS: 90.0000 mg | ORAL_TABLET | Freq: Two times a day (BID) | ORAL | 3 refills | Status: DC

## 2020-06-13 ENCOUNTER — Telehealth: Payer: Self-pay | Admitting: Cardiovascular Disease

## 2020-06-13 NOTE — Telephone Encounter (Signed)
Pt c/o medication issue:  1. Name of Medication:  apixaban (ELIQUIS) 5 MG TABS tablet ticagrelor (BRILINTA) 90 MG TABS tablet  2. How are you currently taking this medication (dosage and times per day)? Patient taking both as directed   3. Are you having a reaction (difficulty breathing--STAT)? Dizzy, weakness, occasional fluttering feeling when he sleeps on his left side    4. What is your medication issue? Patient was told by the pharmacy that he should not be on both of these medications at the same time. He is not sure which one to stay on. Please advise   STAT if patient feels like he/she is going to faint   1) Are you dizzy now? no  2) Do you feel faint or have you passed out? no  3) Do you have any other symptoms?   4) Have you checked your HR and BP (record if available)? No. Patient states it has always been good   Patient said if he stands up too fast he gets lightheaded and dizzy. It does not happen regularly.     Patient c/o Palpitations:  High priority if patient c/o lightheadedness, shortness of breath, or chest pain  1) How long have you had palpitations/irregular HR/ Afib? Are you having the symptoms now? no  2) Are you currently experiencing lightheadedness, SOB or CP? no  3) Do you have a history of afib (atrial fibrillation) or irregular heart rhythm? Fluttering feeling happened since his heart attack  4) Have you checked your BP or HR? (document readings if available): no  5) Are you experiencing any other symptoms?

## 2020-06-13 NOTE — Telephone Encounter (Signed)
Spoke with pt on the phone regarding concerns brought to him by his pharmacist.  Pt was told by his pharmacist that eliquis and brilinta are basically the same medication and that he should call his cardiology office to see which one he can stop taking. Explained to pt that these two medications work differently and they have separate reasons why one would need to take these medications.    Pt addresses concerns that he occasionally feels a fluttering feeling in his heart when he lays in the bed on his left side.  He says that it doesn't happen all the time and when it does he can usually get it to stop by rolling to his back and taking deep breaths for 5-10 minutes. Pt states he is not terribly concerned about this.   Pt also would like to address concerns with other medications as well, pt says that he experiences a bit of dizziness sometimes when he moves from a sitting to standing position.  Pt states that this doesn't happen all the time but when it does he has to stop for a moment to get his bearings before he can continue.   Pt overall wants to know if there are any medications that he can stop or change. He believes that medications contribute to all of the these "little issues" that he is having. Explained to pt that I would have pharmacist, Dr. Tresa Endo and Dr. Allyson Sabal have a look at his medication list. Pt verbalizes understanding and is thankful for this time.  Pt would also like for me to note that while we still have to wear masks in this office he refuses to wear a mask or visit the office, he would like to continue only having telemedicine visits.

## 2020-06-14 NOTE — Telephone Encounter (Signed)
Reviewed with Dr. Allyson Sabal regarding PAD - okay for patient to d/c Brilinta based on PAD.  Reviewed with Dr. Tresa Endo regarding CVD - patient okay to d/c Brilinta and take ASA 81 mg daily.  Returned call to patient to inform him to d/c Brilinta and start ASA 81.   Patient voiced understanding.

## 2020-08-07 DIAGNOSIS — J0100 Acute maxillary sinusitis, unspecified: Secondary | ICD-10-CM | POA: Diagnosis not present

## 2020-08-12 DIAGNOSIS — Z720 Tobacco use: Secondary | ICD-10-CM | POA: Diagnosis not present

## 2020-08-12 DIAGNOSIS — H6593 Unspecified nonsuppurative otitis media, bilateral: Secondary | ICD-10-CM | POA: Diagnosis not present

## 2020-08-28 DIAGNOSIS — Z9622 Myringotomy tube(s) status: Secondary | ICD-10-CM | POA: Diagnosis not present

## 2020-08-28 DIAGNOSIS — H90A32 Mixed conductive and sensorineural hearing loss, unilateral, left ear with restricted hearing on the contralateral side: Secondary | ICD-10-CM | POA: Diagnosis not present

## 2020-11-28 DIAGNOSIS — M65332 Trigger finger, left middle finger: Secondary | ICD-10-CM | POA: Diagnosis not present

## 2020-11-28 DIAGNOSIS — M1811 Unilateral primary osteoarthritis of first carpometacarpal joint, right hand: Secondary | ICD-10-CM | POA: Diagnosis not present

## 2020-12-11 ENCOUNTER — Other Ambulatory Visit: Payer: Self-pay

## 2020-12-11 DIAGNOSIS — I482 Chronic atrial fibrillation, unspecified: Secondary | ICD-10-CM

## 2020-12-11 MED ORDER — APIXABAN 5 MG PO TABS: 5.0000 mg | ORAL_TABLET | Freq: Two times a day (BID) | ORAL | 0 refills | Status: DC

## 2020-12-11 NOTE — Telephone Encounter (Signed)
UNABLE TO LMOM FOR LABS 

## 2020-12-11 NOTE — Telephone Encounter (Signed)
WILL ROUTE TO PHARMD POOL FOR PERMISSION TO ORDER LABS Prescription refill request for Eliquis received. Indication:AFIB Last office visit:[facility] 02/28/20 Scr:1.34 12/05/19 (OVERDUE) Age: 28M Weight:68.9KG

## 2020-12-11 NOTE — Telephone Encounter (Signed)
Please place order for BMET and CBC for annual DOAC monitoring. Updated renal function will be needed to assess appropriate Eliquis dosing.

## 2020-12-26 DIAGNOSIS — M65332 Trigger finger, left middle finger: Secondary | ICD-10-CM | POA: Diagnosis not present

## 2020-12-31 DIAGNOSIS — R42 Dizziness and giddiness: Secondary | ICD-10-CM | POA: Diagnosis not present

## 2020-12-31 DIAGNOSIS — R3915 Urgency of urination: Secondary | ICD-10-CM | POA: Diagnosis not present

## 2021-01-01 DIAGNOSIS — R3915 Urgency of urination: Secondary | ICD-10-CM | POA: Diagnosis not present

## 2021-01-09 DIAGNOSIS — Z4789 Encounter for other orthopedic aftercare: Secondary | ICD-10-CM | POA: Diagnosis not present

## 2021-01-09 DIAGNOSIS — M79641 Pain in right hand: Secondary | ICD-10-CM | POA: Diagnosis not present

## 2021-01-09 DIAGNOSIS — M65332 Trigger finger, left middle finger: Secondary | ICD-10-CM | POA: Diagnosis not present

## 2021-01-09 DIAGNOSIS — M180 Bilateral primary osteoarthritis of first carpometacarpal joints: Secondary | ICD-10-CM | POA: Diagnosis not present

## 2021-01-09 DIAGNOSIS — M79642 Pain in left hand: Secondary | ICD-10-CM | POA: Diagnosis not present

## 2021-01-27 DIAGNOSIS — H26493 Other secondary cataract, bilateral: Secondary | ICD-10-CM | POA: Diagnosis not present

## 2021-02-18 DIAGNOSIS — H40013 Open angle with borderline findings, low risk, bilateral: Secondary | ICD-10-CM | POA: Diagnosis not present

## 2021-02-18 DIAGNOSIS — H43813 Vitreous degeneration, bilateral: Secondary | ICD-10-CM | POA: Diagnosis not present

## 2021-03-10 ENCOUNTER — Other Ambulatory Visit: Payer: Self-pay

## 2021-03-10 DIAGNOSIS — I482 Chronic atrial fibrillation, unspecified: Secondary | ICD-10-CM

## 2021-03-10 MED ORDER — APIXABAN 5 MG PO TABS: 5.0000 mg | ORAL_TABLET | Freq: Two times a day (BID) | ORAL | 0 refills | Status: DC

## 2021-03-10 NOTE — Telephone Encounter (Signed)
Prescription refill request for Eliquis received. Indication:Afib Last office visit:Needs Appointment FTD:[facility] Age: 80 Weight:68.9 kg  Prescription refilled

## 2021-05-16 DIAGNOSIS — M2550 Pain in unspecified joint: Secondary | ICD-10-CM | POA: Diagnosis not present

## 2021-05-16 DIAGNOSIS — J40 Bronchitis, not specified as acute or chronic: Secondary | ICD-10-CM | POA: Diagnosis not present

## 2021-06-02 DIAGNOSIS — N289 Disorder of kidney and ureter, unspecified: Secondary | ICD-10-CM | POA: Diagnosis not present

## 2021-06-07 ENCOUNTER — Other Ambulatory Visit: Payer: Self-pay | Admitting: Adult Health

## 2021-06-10 ENCOUNTER — Telehealth: Payer: Self-pay | Admitting: *Deleted

## 2021-06-10 NOTE — Telephone Encounter (Signed)
Received refill request for Eliquis to be sent to the [facility] in [place].   OV:02/28/2020, [facility] Scr: 02/24/2019, 1.46 Weight:68.9 kg  Age: 81 yo   Pt is overdue for blood work and to see cardiologist. Msg sent to schedulers to make an appointment.

## 2021-06-11 ENCOUNTER — Other Ambulatory Visit: Payer: Self-pay

## 2021-06-11 DIAGNOSIS — I482 Chronic atrial fibrillation, unspecified: Secondary | ICD-10-CM

## 2021-06-23 ENCOUNTER — Other Ambulatory Visit: Payer: Self-pay

## 2021-06-23 ENCOUNTER — Telehealth: Payer: Self-pay | Admitting: Cardiovascular Disease

## 2021-06-23 DIAGNOSIS — I482 Chronic atrial fibrillation, unspecified: Secondary | ICD-10-CM

## 2021-06-23 MED ORDER — APIXABAN 5 MG PO TABS: 5.0000 mg | ORAL_TABLET | Freq: Two times a day (BID) | ORAL | 1 refills | Status: DC

## 2021-06-23 NOTE — Telephone Encounter (Signed)
Patient is calling stating he had labs performed a week and a half ago with his PCP. States Dr. Claiborne Billings can send a fax request to receive them if he would like to see them.  ?

## 2021-06-23 NOTE — Telephone Encounter (Signed)
LMTCB

## 2021-06-23 NOTE — Telephone Encounter (Signed)
Prescription refill request for Eliquis received. ?Indication:Afib ?Last office visit:upcoming ?Scr:1.36 ?Age: 81 ?Weight:68.9 kg ? ?Prescription refilled ? ?

## 2021-06-23 NOTE — Telephone Encounter (Signed)
Spoke with patient and explained that I sent his eliquis refill request to pharmacy and they will refill medication. ?

## 2021-06-23 NOTE — Telephone Encounter (Signed)
?*  STAT* If patient is at the pharmacy, call can be transferred to refill team. ? ? ?1. Which medications need to be refilled? (please list name of each medication and dose if known) apixaban (ELIQUIS) 5 MG TABS tablet ? ?2. Which pharmacy/location (including street and city if local pharmacy) is medication to be sent to? [id number] [place], [place] - [address] ? ?3. Do they need a 30 day or 90 day supply? 90 day supply  ? ?Patient has been out of medication for two days. Is scheduled to be seen on 04/19.  ?  ?

## 2021-06-24 MED ORDER — APIXABAN 5 MG PO TABS: 5.0000 mg | ORAL_TABLET | Freq: Two times a day (BID) | ORAL | 0 refills | Status: DC

## 2021-06-24 NOTE — Telephone Encounter (Signed)
Pt/wife notified rx will be sent w/enough to last to scheduled appt only, since not been seen since 2021. ?

## 2021-06-24 NOTE — Telephone Encounter (Signed)
Patient and wife are calling wanting to know why only 30 tablets were sent in. Advice them he is needing to be seen before a 90 day supply can be provided since he has not been seen since 2021. Requesting callback to discuss why a 15 day supply was given when he is not scheduled to be seen until 04/19.  ?

## 2021-07-01 DIAGNOSIS — I10 Essential (primary) hypertension: Secondary | ICD-10-CM | POA: Diagnosis not present

## 2021-07-01 DIAGNOSIS — I480 Paroxysmal atrial fibrillation: Secondary | ICD-10-CM | POA: Diagnosis not present

## 2021-07-01 DIAGNOSIS — J449 Chronic obstructive pulmonary disease, unspecified: Secondary | ICD-10-CM | POA: Diagnosis not present

## 2021-07-01 DIAGNOSIS — I2510 Atherosclerotic heart disease of native coronary artery without angina pectoris: Secondary | ICD-10-CM | POA: Diagnosis not present

## 2021-07-17 ENCOUNTER — Ambulatory Visit: Payer: BC Managed Care – PPO | Admitting: Student

## 2021-07-19 ENCOUNTER — Other Ambulatory Visit: Payer: Self-pay | Admitting: Cardiovascular Disease

## 2021-07-19 DIAGNOSIS — I482 Chronic atrial fibrillation, unspecified: Secondary | ICD-10-CM

## 2021-07-21 NOTE — Telephone Encounter (Signed)
Prescription refill request for Eliquis received. ?Indication:Afib ?Last office visit:upcoming ?[id number] Labs ?Age: 81 ?Weight:68.9 kg ? ?Prescription refilled ? ?

## 2021-07-30 ENCOUNTER — Encounter: Payer: Self-pay | Admitting: Cardiovascular Disease

## 2021-07-30 ENCOUNTER — Ambulatory Visit (INDEPENDENT_AMBULATORY_CARE_PROVIDER_SITE_OTHER): Payer: BC Managed Care – PPO | Admitting: Cardiovascular Disease

## 2021-07-30 DIAGNOSIS — I739 Peripheral vascular disease, unspecified: Secondary | ICD-10-CM | POA: Diagnosis not present

## 2021-07-30 DIAGNOSIS — R0602 Shortness of breath: Secondary | ICD-10-CM

## 2021-07-30 DIAGNOSIS — I6523 Occlusion and stenosis of bilateral carotid arteries: Secondary | ICD-10-CM | POA: Diagnosis not present

## 2021-07-30 DIAGNOSIS — E785 Hyperlipidemia, unspecified: Secondary | ICD-10-CM

## 2021-07-30 DIAGNOSIS — I25119 Atherosclerotic heart disease of native coronary artery with unspecified angina pectoris: Secondary | ICD-10-CM

## 2021-07-30 DIAGNOSIS — I2510 Atherosclerotic heart disease of native coronary artery without angina pectoris: Secondary | ICD-10-CM

## 2021-07-30 DIAGNOSIS — I482 Chronic atrial fibrillation, unspecified: Secondary | ICD-10-CM

## 2021-07-30 DIAGNOSIS — R5383 Other fatigue: Secondary | ICD-10-CM

## 2021-07-30 DIAGNOSIS — I480 Paroxysmal atrial fibrillation: Secondary | ICD-10-CM

## 2021-07-30 DIAGNOSIS — I452 Bifascicular block: Secondary | ICD-10-CM

## 2021-07-30 DIAGNOSIS — Z79899 Other long term (current) drug therapy: Secondary | ICD-10-CM

## 2021-07-30 NOTE — Progress Notes (Signed)
Visit  ? ?Cardiology Office Note   ? ?Date:  07/31/2021  ? ?ID:  Ralph Dawson, DOB October 21, 1940, MRN [id number] ? ?PCP:  Chesley Noon, MD  ?Cardiologist:  Shelva Majestic, MD  ? ?30 month F/U  ? ?History of Present Illness:  ?Ralph Dawson is a 81 y.o. male who has PVD and underwent left carotid endarterectomy in September 2009 and in April 2011 underwent right carotid endarterectomy. He has a history of lower extremity intermittent claudication, hyperlipidemia, as well as mild valvular heart disease with mild mitral annular calcification with mild MR, mild TR, and mild aortic valve sclerosis with normal systolic and diastolic function. A nuclear perfusion study November 2011 showed normal perfusion. He has had GERD symptoms which have improved with pantoprazole. He was started on pravastatin 80 mg by Dr. Edrick Oh and last year his LDL cholesterol  was 65. He does have documented right bundle branch block.   ?  ?When I saw him he denied any episodes of chest pain applications.. There is a long-standing tobacco history and continues to smoke, now only 8 cigarettes per day. He has noticed some mild shortness of breath, particularly with significant activity.  He has a history of GERD for which he takes protonix.  He has been taking Cymbalta for depression. ?  ?In November 2015 an echo Doppler study showed mild left ventricular hypertrophy with hyperdynamic LV function and ejection fraction of 65-70%.  There was grade 1 diastolic dysfunction. ?  ?Unfortunately he continues to smoke and has been smoking for 70 years.  He was seen by Almyra Deforest in January 2018. In April 27 2017 he underwent cardiac catheterization by Dr. Irish Lack found to have a 95% proximal circumflex stenosis which required PCI and use of a Guideliner to get the stent around the proximal bend in the circumflex vessel.  Echo Doppler study revealed an EF of 60-65%. There were no significant valvular abnormalities.   ?  ?Since I last saw him in April  2019, he was evaluated by Jory Sims in October 2019.  At that time he was without complaints from a cardiac standpoint.  He had been treated for pneumonia by his primary physician.  Unfortunately he was still smoking cigarettes.  He did note occasional shortness of breath when taking Brilinta but otherwise remained stable.   ?  ?He was evaluated by me in a telemedicine visit on December 06, 2018.  He continues to smoke cigarettes and at that time denied any chest pain or palpitations.  He had experienced some left calf discomfort with walking.  He admitted to shortness of breath with activity. ? ?I last saw him on February 27, 2019 and prior to that evaluation he was evaluated by Dr. Gwenlyn Found on December 27, 2018 after undergoing Dopplers which I ordered on December 20, 2018  that revealed a left ABI of 0.52 with high-frequency signals in his left common femoral and distal left SFA.  He underwent peripheral angiography in January 12, 2019 demonstrating 95% tandem mid left SFA stenosis with three-vessel runoff.  He had a Hawk 1 directional atherectomy followed by drug coated balloon angioplasty with an excellent angiographic result.  Post procedure the patient had some tachyarrhythmias on telemetry which appeared to be PAF versus atrial flutter.  He wore a 2-week Zio patch monitor for further evaluation.  His cardiac monitor revealed atrial fibrillation with RVR and as result since he was in need for anticoagulation, his antiplatelet therapy was changed to Plavix from [place] and  he was started on apixaban.  He was told to increase his metoprolol dose to 50 mg but apparently did not tolerate this and therefore was only been taking 25 mg daily.  During that evaluation his blood pressure was stable on lisinopril 5 mg in addition to isosorbide. ? ?Since I last saw him 2-1/2 years ago, he continues to be active and works in his yard where he does everything from normal patterns, working on Psychologist, forensic, and painting.  At  times, he notes the cramps in his calves with walking.  He continues to smoke cigarettes and has been smoking for ~70 years having started at age 1.  He currently is smoking 1 pack/day down from 1-1/2 packs/day.  Because of his history of atrial fibrillation he continues to be on anticoagulation with Eliquis.  He continues to take aspirin 81 mg, metoprolol succinate 25 mg, lisinopril 5 mg in addition to isosorbide dinitrate 15 mg at bedtime.  He also continues to be on atorvastatin 80 mg daily. ? ?Past Medical History:  ?Diagnosis Date  ? Arthritis   ? "hands, feet" (04/27/2017)  ? Atrial fibrillation/flutter   ? a. during admission 01/2019 for PAD angiogram - isolated event after allergic reaction/steroids, monitor planned.  ? Bradycardia   ? a. brief episode of nocturnal bradycardia 01/2019 (?NSR with blocked PACs).  ? CAD (coronary artery disease)   ? Carotid stenosis 09/24/2011  ? R ICA patent w/ hx of endarterectomy, stenosis 1-39%;  L ICA patent w/ hx of endarterectomy; external carotids appear patent; see imaging tab for full report  ? Chest heaviness 02/25/2009  ? PVCs, atrial bigeminy  ? CKD (chronic kidney disease), stage III ([place])   ? Claudication ([place]) 06/17/2011  ? LE doppler - bilateral ABIs normal values at rest; R CIA >50% diameter reduction L CIA 0-49% reduction; bilateral SFAs mild/mod mixed density plaque throughout suggesting 50-69% diameter reduction  ? Contrast media allergy   ? GERD (gastroesophageal reflux disease)   ? Hyperlipidemia   ? Hypertension 02/13/2010  ? echo - EF >55%; mild mitral annular calcification; mild aortic valve sclerosis  ? NSTEMI (non-ST elevated myocardial infarction) ([place]) 04/26/2017  ? Archie Endo 04/27/2017  ? PAD (peripheral artery disease) ([place])   ? a. s/p LLE intervention on 01/2019.  ? RBBB (right bundle branch block) 02/13/2010  ? R/P MV - EF 67%; normal perfusion all regions; no significant wall abnormalties noted  ? Tobacco abuse   ? ? ?Past Surgical History:  ?Procedure  Laterality Date  ? ABDOMINAL AORTOGRAM W/LOWER EXTREMITY Bilateral 01/12/2019  ? Procedure: ABDOMINAL AORTOGRAM W/LOWER EXTREMITY;  Surgeon: Lorretta Harp, MD;  Location: [facility];  Service: Cardiovascular;  Laterality: Bilateral;  ? APPENDECTOMY    ? ARTERIAL BYPASS SURGRY    ? pt unaware of this OR on 04/27/2017  ? BACK SURGERY    ? CAROTID ENDARTERECTOMY Right 07/15/2009  ? Right CEA  ? CAROTID ENDARTERECTOMY Left 12/13/2007  ? Left CEA  ? CORONARY ANGIOPLASTY WITH STENT PLACEMENT  04/27/2017  ? CORONARY STENT INTERVENTION N/A 04/27/2017  ? Procedure: CORONARY STENT INTERVENTION;  Surgeon: Jettie Booze, MD;  Location: [facility];  Service: Cardiovascular;  Laterality: N/A;  ? LAPAROSCOPIC CHOLECYSTECTOMY    ? LEFT HEART CATH AND CORONARY ANGIOGRAPHY N/A 04/27/2017  ? Procedure: LEFT HEART CATH AND CORONARY ANGIOGRAPHY;  Surgeon: Jettie Booze, MD;  Location: [facility];  Service: Cardiovascular;  Laterality: N/A;  ? LUMBAR DISC SURGERY    ?  PERIPHERAL VASCULAR ATHERECTOMY Left 01/12/2019  ? Procedure: PERIPHERAL VASCULAR ATHERECTOMY;  Surgeon: Lorretta Harp, MD;  Location: [facility];  Service: Cardiovascular;  Laterality: Left;  SFA  ? TONSILLECTOMY    ? ? ?Current Medications: ?Outpatient Medications Prior to Visit  ?Medication Sig Dispense Refill  ? acetaminophen (TYLENOL) 650 MG CR tablet Take 1 tablet (650 mg total) by mouth every 8 (eight) hours as needed for pain. 150 tablet 6  ? albuterol (PROVENTIL HFA;VENTOLIN HFA) 108 (90 Base) MCG/ACT inhaler Inhale 1-2 puffs into the lungs every 6 (six) hours as needed (wheezing/shortness of breath.).     ? apixaban (ELIQUIS) 5 MG TABS tablet TAKE 1 TABLET BY MOUTH TWICE A DAY 60 tablet 0  ? atorvastatin (LIPITOR) 80 MG tablet TAKE 1 TABLET BY MOUTH EVERY DAY AT 6PM (Patient taking differently: Take 80 mg by mouth at bedtime.) 90 tablet 0  ? azelastine (ASTELIN) 0.1 % nasal spray one spray by Both Nostrils route 2  (two) times daily. Use in each nostril as directed    ? diclofenac Sodium (VOLTAREN) 1 % GEL Apply 2 g topically 4 (four) times daily. 50 g 1  ? donepezil (ARICEPT) 5 MG tablet Take 5 mg by mouth at bedtime

## 2021-07-30 NOTE — Patient Instructions (Addendum)
Medication Instructions:  ?The current medical regimen is effective;  continue present plan and medications as directed. Please refer to the Current Medication list given to you today.  ? ?*If you need a refill on your cardiac medications before your next appointment, please call your pharmacy* ? ?Lab Work:    ?FASTING LIPID, CMET,CBC AND TSH ? ?If you have labs (blood work) drawn today and your tests are completely normal, you will receive your results only by: ?MyChart Message (if you have MyChart) OR  A paper copy in the mail ?If you have any lab test that is abnormal or we need to change your treatment, we will call you to review the results. ? ?Testing/Procedures:  ?Echocardiogram - Your physician has requested that you have an echocardiogram. Echocardiography is a painless test that uses sound waves to create images of your heart. It provides your doctor with information about the size and shape of your heart and how well your heart?s chambers and valves are working. This procedure takes approximately one hour. There are no restrictions for this procedure. This will be performed at either our [facility] location - [address] -or- [facility] location [employer] 2nd floor. ? ?Your physician has requested that you have a carotid duplex. This test is an ultrasound of the carotid arteries in your neck. It looks at blood flow through these arteries that supply the brain with blood. Allow one hour for this exam. There are no restrictions or special instructions. THIS WILL BE HERE IN OUR OFFICE. ? ?Your physician has requested that you have a lower extremity arterial duplex. During this test, ultrasound is used to evaluate arterial blood flow in the legs. Allow one hour for this exam. There are no restrictions or special instructions. ? ?Follow-Up: ?Your next appointment:  3-4 month(s) In Person with Nicki Guadalajara, MD    ? ?At [facility], you and your health needs are our priority.  As  part of our continuing mission to provide you with exceptional heart care, we have created designated Provider Care Teams.  These Care Teams include your primary Cardiologist (physician) and Advanced Practice Providers (APPs -  Physician Assistants and Nurse Practitioners) who all work together to provide you with the care you need, when you need it. ? ? ? ?Important Information About [place] ? ? ? ? ? ? ? ?  ? ?

## 2021-07-31 ENCOUNTER — Encounter: Payer: Self-pay | Admitting: Cardiovascular Disease

## 2021-08-05 DIAGNOSIS — I480 Paroxysmal atrial fibrillation: Secondary | ICD-10-CM | POA: Diagnosis not present

## 2021-08-05 DIAGNOSIS — I10 Essential (primary) hypertension: Secondary | ICD-10-CM | POA: Diagnosis not present

## 2021-08-05 DIAGNOSIS — K219 Gastro-esophageal reflux disease without esophagitis: Secondary | ICD-10-CM | POA: Diagnosis not present

## 2021-08-05 DIAGNOSIS — I2510 Atherosclerotic heart disease of native coronary artery without angina pectoris: Secondary | ICD-10-CM | POA: Diagnosis not present

## 2021-08-07 DIAGNOSIS — I482 Chronic atrial fibrillation, unspecified: Secondary | ICD-10-CM | POA: Diagnosis not present

## 2021-08-07 DIAGNOSIS — E785 Hyperlipidemia, unspecified: Secondary | ICD-10-CM | POA: Diagnosis not present

## 2021-08-07 DIAGNOSIS — I2510 Atherosclerotic heart disease of native coronary artery without angina pectoris: Secondary | ICD-10-CM | POA: Diagnosis not present

## 2021-08-07 DIAGNOSIS — I6523 Occlusion and stenosis of bilateral carotid arteries: Secondary | ICD-10-CM | POA: Diagnosis not present

## 2021-08-07 DIAGNOSIS — Z79899 Other long term (current) drug therapy: Secondary | ICD-10-CM | POA: Diagnosis not present

## 2021-08-07 DIAGNOSIS — R5383 Other fatigue: Secondary | ICD-10-CM | POA: Diagnosis not present

## 2021-08-07 LAB — CBC
Hematocrit: 41.3 % (ref 37.5–51.0)
Hemoglobin: 14.7 g/dL (ref 13.0–17.7)
MCH: 32.7 pg (ref 26.6–33.0)
MCHC: 35.6 g/dL (ref 31.5–35.7)
MCV: 92 fL (ref 79–97)
Platelets: 226 10*3/uL (ref 150–450)
RBC: 4.49 x10E6/uL (ref 4.14–5.80)
RDW: 12.9 % (ref 11.6–15.4)
WBC: 11.7 10*3/uL — ABNORMAL HIGH (ref 3.4–10.8)

## 2021-08-07 LAB — COMPREHENSIVE METABOLIC PANEL
ALT: 18 IU/L (ref 0–44)
AST: 23 IU/L (ref 0–40)
Albumin/Globulin Ratio: 1.6 (ref 1.2–2.2)
Albumin: 4.3 g/dL (ref 3.6–4.6)
Alkaline Phosphatase: 88 IU/L (ref 44–121)
BUN/Creatinine Ratio: 16 (ref 10–24)
BUN: 25 mg/dL (ref 8–27)
Bilirubin Total: 0.6 mg/dL (ref 0.0–1.2)
CO2: 23 mmol/L (ref 20–29)
Calcium: 9 mg/dL (ref 8.6–10.2)
Chloride: 104 mmol/L (ref 96–106)
Creatinine, Ser: 1.54 mg/dL — ABNORMAL HIGH (ref 0.76–1.27)
Globulin, Total: 2.7 g/dL (ref 1.5–4.5)
Glucose: 92 mg/dL (ref 70–99)
Potassium: 4.4 mmol/L (ref 3.5–5.2)
Sodium: 140 mmol/L (ref 134–144)
Total Protein: 7 g/dL (ref 6.0–8.5)
eGFR: 45 mL/min/{1.73_m2} — ABNORMAL LOW (ref >59)

## 2021-08-07 LAB — LIPID PANEL
Chol/HDL Ratio: 4.2 ratio (ref 0.0–5.0)
Cholesterol, Total: 161 mg/dL (ref 100–199)
HDL: 38 mg/dL — ABNORMAL LOW (ref >39)
LDL Chol Calc (NIH): 90 mg/dL (ref 0–99)
Triglycerides: 195 mg/dL — ABNORMAL HIGH (ref 0–149)
VLDL Cholesterol Cal: 33 mg/dL (ref 5–40)

## 2021-08-07 LAB — TSH: TSH: 1.62 u[IU]/mL (ref 0.450–4.500)

## 2021-08-11 ENCOUNTER — Encounter: Payer: Self-pay | Admitting: *Deleted

## 2021-08-11 ENCOUNTER — Ambulatory Visit (HOSPITAL_COMMUNITY)
Admission: RE | Admit: 2021-08-11 | Discharge: 2021-08-11 | Disposition: A | Discharge status: Home / Self Care | Payer: BC Managed Care – PPO | Source: Ambulatory Visit | Attending: Cardiology | Admitting: Cardiology

## 2021-08-11 DIAGNOSIS — I25119 Atherosclerotic heart disease of native coronary artery with unspecified angina pectoris: Secondary | ICD-10-CM | POA: Diagnosis not present

## 2021-08-11 DIAGNOSIS — I6523 Occlusion and stenosis of bilateral carotid arteries: Secondary | ICD-10-CM | POA: Insufficient documentation

## 2021-08-13 ENCOUNTER — Ambulatory Visit (HOSPITAL_COMMUNITY): Payer: BC Managed Care – PPO | Attending: Cardiovascular Disease

## 2021-08-13 DIAGNOSIS — R0602 Shortness of breath: Secondary | ICD-10-CM | POA: Diagnosis not present

## 2021-08-13 DIAGNOSIS — I25119 Atherosclerotic heart disease of native coronary artery with unspecified angina pectoris: Secondary | ICD-10-CM | POA: Diagnosis not present

## 2021-08-13 DIAGNOSIS — R5383 Other fatigue: Secondary | ICD-10-CM | POA: Insufficient documentation

## 2021-08-13 LAB — ECHOCARDIOGRAM COMPLETE
Area-P 1/2: 2.93 cm2
MV M vel: 4.81 m/s
MV Peak grad: 92.5 mmHg
Radius: 0.4 cm
S' Lateral: 3.3 cm

## 2021-08-14 DIAGNOSIS — J310 Chronic rhinitis: Secondary | ICD-10-CM | POA: Diagnosis not present

## 2021-08-15 DIAGNOSIS — J329 Chronic sinusitis, unspecified: Secondary | ICD-10-CM | POA: Diagnosis not present

## 2021-08-21 DIAGNOSIS — H9313 Tinnitus, bilateral: Secondary | ICD-10-CM | POA: Diagnosis not present

## 2021-08-21 DIAGNOSIS — H903 Sensorineural hearing loss, bilateral: Secondary | ICD-10-CM | POA: Diagnosis not present

## 2021-08-22 ENCOUNTER — Other Ambulatory Visit: Payer: Self-pay | Admitting: Cardiovascular Disease

## 2021-08-22 DIAGNOSIS — I739 Peripheral vascular disease, unspecified: Secondary | ICD-10-CM

## 2021-08-28 ENCOUNTER — Ambulatory Visit (HOSPITAL_COMMUNITY)
Admission: RE | Admit: 2021-08-28 | Discharge: 2021-08-28 | Disposition: A | Discharge status: Home / Self Care | Payer: BC Managed Care – PPO | Source: Ambulatory Visit | Attending: Internal Medicine | Admitting: Internal Medicine

## 2021-08-28 DIAGNOSIS — I739 Peripheral vascular disease, unspecified: Secondary | ICD-10-CM | POA: Insufficient documentation

## 2021-09-03 DIAGNOSIS — H40013 Open angle with borderline findings, low risk, bilateral: Secondary | ICD-10-CM | POA: Diagnosis not present

## 2021-09-03 DIAGNOSIS — H43813 Vitreous degeneration, bilateral: Secondary | ICD-10-CM | POA: Diagnosis not present

## 2021-09-23 ENCOUNTER — Telehealth: Payer: Self-pay | Admitting: Cardiovascular Disease

## 2021-09-23 NOTE — Telephone Encounter (Signed)
Patient returned call for his test results.  

## 2021-09-23 NOTE — Telephone Encounter (Signed)
Left message for pt to call back  °

## 2021-09-30 NOTE — Telephone Encounter (Signed)
Updated results.  Thanks!

## 2021-09-30 NOTE — Telephone Encounter (Signed)
Spouse given the results of vas [place] lower extremity arterial duplex per Dr. Tresa Endo.   "Atherosclerosis in the common femoral femoral and popliteal arteries on the left with 50 to 74% stenosis in the distal SFA status postangioplasty.  No significant change from prior evaluation."  No questions or concerns at this time.

## 2021-11-11 DIAGNOSIS — I480 Paroxysmal atrial fibrillation: Secondary | ICD-10-CM | POA: Diagnosis not present

## 2021-11-11 DIAGNOSIS — I10 Essential (primary) hypertension: Secondary | ICD-10-CM | POA: Diagnosis not present

## 2021-11-11 DIAGNOSIS — E782 Mixed hyperlipidemia: Secondary | ICD-10-CM | POA: Diagnosis not present

## 2021-11-11 DIAGNOSIS — I2510 Atherosclerotic heart disease of native coronary artery without angina pectoris: Secondary | ICD-10-CM | POA: Diagnosis not present

## 2022-01-01 DIAGNOSIS — R9389 Abnormal findings on diagnostic imaging of other specified body structures: Secondary | ICD-10-CM | POA: Diagnosis not present

## 2022-01-01 DIAGNOSIS — I452 Bifascicular block: Secondary | ICD-10-CM | POA: Diagnosis not present

## 2022-01-01 DIAGNOSIS — I2510 Atherosclerotic heart disease of native coronary artery without angina pectoris: Secondary | ICD-10-CM | POA: Diagnosis not present

## 2022-01-01 DIAGNOSIS — I10 Essential (primary) hypertension: Secondary | ICD-10-CM | POA: Diagnosis not present

## 2022-01-01 DIAGNOSIS — R000 Tachycardia, unspecified: Secondary | ICD-10-CM | POA: Diagnosis not present

## 2022-01-01 DIAGNOSIS — J449 Chronic obstructive pulmonary disease, unspecified: Secondary | ICD-10-CM | POA: Diagnosis not present

## 2022-01-01 DIAGNOSIS — J439 Emphysema, unspecified: Secondary | ICD-10-CM | POA: Diagnosis not present

## 2022-01-01 DIAGNOSIS — R12 Heartburn: Secondary | ICD-10-CM | POA: Diagnosis not present

## 2022-01-01 DIAGNOSIS — I4891 Unspecified atrial fibrillation: Secondary | ICD-10-CM | POA: Diagnosis not present

## 2022-01-01 DIAGNOSIS — R0602 Shortness of breath: Secondary | ICD-10-CM | POA: Diagnosis not present

## 2022-01-01 DIAGNOSIS — I499 Cardiac arrhythmia, unspecified: Secondary | ICD-10-CM | POA: Diagnosis not present

## 2022-01-01 DIAGNOSIS — I480 Paroxysmal atrial fibrillation: Secondary | ICD-10-CM | POA: Diagnosis not present

## 2022-01-01 DIAGNOSIS — I444 Left anterior fascicular block: Secondary | ICD-10-CM | POA: Diagnosis not present

## 2022-01-01 DIAGNOSIS — I482 Chronic atrial fibrillation, unspecified: Secondary | ICD-10-CM | POA: Diagnosis not present

## 2022-01-01 DIAGNOSIS — F17210 Nicotine dependence, cigarettes, uncomplicated: Secondary | ICD-10-CM | POA: Diagnosis not present

## 2022-01-01 DIAGNOSIS — J9811 Atelectasis: Secondary | ICD-10-CM | POA: Diagnosis not present

## 2022-01-01 DIAGNOSIS — E785 Hyperlipidemia, unspecified: Secondary | ICD-10-CM | POA: Diagnosis not present

## 2022-01-01 DIAGNOSIS — R0789 Other chest pain: Secondary | ICD-10-CM | POA: Diagnosis not present

## 2022-01-01 DIAGNOSIS — R0600 Dyspnea, unspecified: Secondary | ICD-10-CM | POA: Diagnosis not present

## 2022-01-01 DIAGNOSIS — R918 Other nonspecific abnormal finding of lung field: Secondary | ICD-10-CM | POA: Diagnosis not present

## 2022-01-01 DIAGNOSIS — R0601 Orthopnea: Secondary | ICD-10-CM | POA: Diagnosis not present

## 2022-01-02 ENCOUNTER — Other Ambulatory Visit: Payer: Self-pay

## 2022-01-02 ENCOUNTER — Encounter (HOSPITAL_COMMUNITY): Payer: Self-pay

## 2022-01-02 ENCOUNTER — Emergency Department (HOSPITAL_COMMUNITY): Payer: BC Managed Care – PPO

## 2022-01-02 ENCOUNTER — Emergency Department (HOSPITAL_COMMUNITY)
Admission: EM | Admit: 2022-01-02 | Discharge: 2022-01-02 | Discharge status: Against Medical Advice | Payer: BC Managed Care – PPO | Attending: Emergency Medicine | Admitting: Emergency Medicine

## 2022-01-02 ENCOUNTER — Telehealth: Payer: Self-pay | Admitting: Cardiovascular Disease

## 2022-01-02 DIAGNOSIS — J431 Panlobular emphysema: Secondary | ICD-10-CM | POA: Diagnosis not present

## 2022-01-02 DIAGNOSIS — I214 Non-ST elevation (NSTEMI) myocardial infarction: Secondary | ICD-10-CM | POA: Diagnosis not present

## 2022-01-02 DIAGNOSIS — R079 Chest pain, unspecified: Secondary | ICD-10-CM | POA: Diagnosis not present

## 2022-01-02 DIAGNOSIS — R0602 Shortness of breath: Secondary | ICD-10-CM | POA: Insufficient documentation

## 2022-01-02 DIAGNOSIS — Z5321 Procedure and treatment not carried out due to patient leaving prior to being seen by health care provider: Secondary | ICD-10-CM | POA: Insufficient documentation

## 2022-01-02 DIAGNOSIS — I480 Paroxysmal atrial fibrillation: Secondary | ICD-10-CM | POA: Diagnosis not present

## 2022-01-02 DIAGNOSIS — I10 Essential (primary) hypertension: Secondary | ICD-10-CM | POA: Diagnosis not present

## 2022-01-02 DIAGNOSIS — R918 Other nonspecific abnormal finding of lung field: Secondary | ICD-10-CM | POA: Diagnosis not present

## 2022-01-02 DIAGNOSIS — R0789 Other chest pain: Secondary | ICD-10-CM | POA: Diagnosis not present

## 2022-01-02 LAB — BASIC METABOLIC PANEL
Anion gap: 11 (ref 5–15)
BUN: 19 mg/dL (ref 8–23)
CO2: 24 mmol/L (ref 22–32)
Calcium: 9 mg/dL (ref 8.9–10.3)
Chloride: 101 mmol/L (ref 98–111)
Creatinine, Ser: 1.68 mg/dL — ABNORMAL HIGH (ref 0.61–1.24)
GFR, Estimated: 41 mL/min — ABNORMAL LOW (ref >60)
Glucose, Bld: 83 mg/dL (ref 70–99)
Potassium: 3.7 mmol/L (ref 3.5–5.1)
Sodium: 136 mmol/L (ref 135–145)

## 2022-01-02 LAB — CBC
HCT: 38 % — ABNORMAL LOW (ref 39.0–52.0)
Hemoglobin: 12.8 g/dL — ABNORMAL LOW (ref 13.0–17.0)
MCH: 33.4 pg (ref 26.0–34.0)
MCHC: 33.7 g/dL (ref 30.0–36.0)
MCV: 99.2 fL (ref 80.0–100.0)
Platelets: 187 10*3/uL (ref 150–400)
RBC: 3.83 MIL/uL — ABNORMAL LOW (ref 4.22–5.81)
RDW: 13.2 % (ref 11.5–15.5)
WBC: 9.9 10*3/uL (ref 4.0–10.5)
nRBC: 0 % (ref 0.0–0.2)

## 2022-01-02 LAB — TROPONIN I (HIGH SENSITIVITY)
Troponin I (High Sensitivity): 23 ng/L — ABNORMAL HIGH (ref <18)
Troponin I (High Sensitivity): 22 ng/L — ABNORMAL HIGH (ref <18)

## 2022-01-02 NOTE — ED Provider Triage Note (Signed)
Emergency Medicine Provider Triage Evaluation Note  Ralph Dawson , Dawson 81 y.o. male  was evaluated in triage.  Pt complains of chest pain onset prior to arrival.  Patient notes that he felt like he had indigestion to the area.  He notes that his pain lasted for couple minutes.  Notes that he had shortness of breath at that time however it resolved prior to seeing his primary care provider.  No meds tried prior to arrival.  Has stents that have been placed and has Dawson cardiologist as well.  Notes that his symptoms have resolved at this time.  Review of Systems  Positive:  Negative:   Physical Exam  BP 112/72 (BP Location: Right Arm)   Pulse (!) 56   Temp 98.2 F (36.8 C) (Oral)   Resp 15   SpO2 96%  Gen:   Awake, in no acute distress Resp:  Normal effort  MSK:   Moves extremities without difficulty  Other:  No chest wall tenderness to palpation.  Medical Decision Making  Medically screening exam initiated at 1:57 PM.  Appropriate orders placed.  Ralph Dawson was informed that the remainder of the evaluation will be completed by another provider, this initial triage assessment does not replace that evaluation, and the importance of remaining in the ED until their evaluation is complete.  Work-up initiated   Ralph Kernen A, PA-C 01/02/22 1406

## 2022-01-02 NOTE — ED Notes (Signed)
Pt states that they are leaving. Encouraged pt to stay but pt states that they feel better and would rather follow up with their PCP regarding their results.

## 2022-01-02 NOTE — Telephone Encounter (Signed)
STAT if HR is under 50 or over 120 (normal HR is 60-100 beats per minute)  What is your heart rate? 150 (he went to the ER and brought back down 70  Do you have a log of your heart rate readings (document readings)?    Do you have any other symptoms? Sob  Pt c/o Shortness Of Breath: STAT if SOB developed within the last 24 hours or pt is noticeably SOB on the phone  1. Are you currently SOB (can you hear that pt is SOB on the phone)? no  2. How long have you been experiencing SOB? 2 weeks   3. Are you SOB when sitting or when up moving around? Both   4. Are you currently experiencing any other symptoms?     Patient went to the ER on yesterday they brought his hr down and told him to follow up with Dr. Claiborne Billings. Patient is wondering would be okay for him to wait till 10/3 or do he needs to come in sooner. Plese advise

## 2022-01-02 NOTE — ED Triage Notes (Addendum)
[facility] c/o CP and a-fib. Pt was seen in the ED yesterday for a-fib RVR left AMA d/t not being able to to drive at night. Pt states the pain has now subsided that he could not feel any better but the doctor told him he was still in a-fib and needed to come over.

## 2022-01-02 NOTE — Telephone Encounter (Signed)
Returned call to patient who states that he has been short of breath for 2 weeks now and states he went to his pulmonologist yesterday and was sent to the ER at [facility]. Patient states that when he got to the ER his HR was 150 and he was in atrial fib RVR. Patient reports that he got diltiazem IV and his heart rate came down. Patient states that he also had a chest Xray and Chest CT which patient reports possibly showed pneumonia. Patient reports that he left AMA from the ER due to having to get home because he does not drive in the dark.  Patient is still very short of breath on the phone.   Spoke with DOD who states that patient should report back to the ER and call his PCP and get appointment for next week with our office.   Advised patient that he should report back to ER, gave patient directions to [facility] at this is closer to his home. Patient states he is starting to feel a little better and reports that he will go to the ER if he feels worse. Advised patient that our recommendation is that he is seen today in the ER. Advised patient that if symptoms worsen he should call 911. Patient aware and verbalized understanding.   Scheduled patient to see Coletta Memos, NP on Monday 9/25- patient states he will also call his PCP today.   Patient aware of appointment time and date and verbalized understanding. Will forward to MD to make aware.

## 2022-01-04 DIAGNOSIS — J439 Emphysema, unspecified: Secondary | ICD-10-CM | POA: Diagnosis not present

## 2022-01-04 DIAGNOSIS — I129 Hypertensive chronic kidney disease with stage 1 through stage 4 chronic kidney disease, or unspecified chronic kidney disease: Secondary | ICD-10-CM | POA: Diagnosis not present

## 2022-01-04 DIAGNOSIS — N1831 Chronic kidney disease, stage 3a: Secondary | ICD-10-CM | POA: Diagnosis not present

## 2022-01-04 DIAGNOSIS — I4891 Unspecified atrial fibrillation: Secondary | ICD-10-CM | POA: Diagnosis not present

## 2022-01-04 DIAGNOSIS — I480 Paroxysmal atrial fibrillation: Secondary | ICD-10-CM | POA: Diagnosis not present

## 2022-01-04 DIAGNOSIS — I739 Peripheral vascular disease, unspecified: Secondary | ICD-10-CM | POA: Diagnosis not present

## 2022-01-04 DIAGNOSIS — F17210 Nicotine dependence, cigarettes, uncomplicated: Secondary | ICD-10-CM | POA: Diagnosis not present

## 2022-01-04 DIAGNOSIS — R918 Other nonspecific abnormal finding of lung field: Secondary | ICD-10-CM | POA: Diagnosis not present

## 2022-01-04 DIAGNOSIS — I517 Cardiomegaly: Secondary | ICD-10-CM | POA: Diagnosis not present

## 2022-01-04 DIAGNOSIS — F418 Other specified anxiety disorders: Secondary | ICD-10-CM | POA: Diagnosis not present

## 2022-01-04 DIAGNOSIS — I2510 Atherosclerotic heart disease of native coronary artery without angina pectoris: Secondary | ICD-10-CM | POA: Diagnosis not present

## 2022-01-04 DIAGNOSIS — E785 Hyperlipidemia, unspecified: Secondary | ICD-10-CM | POA: Diagnosis not present

## 2022-01-04 DIAGNOSIS — I452 Bifascicular block: Secondary | ICD-10-CM | POA: Diagnosis not present

## 2022-01-04 DIAGNOSIS — F32A Depression, unspecified: Secondary | ICD-10-CM | POA: Diagnosis not present

## 2022-01-04 DIAGNOSIS — K219 Gastro-esophageal reflux disease without esophagitis: Secondary | ICD-10-CM | POA: Diagnosis not present

## 2022-01-04 DIAGNOSIS — J449 Chronic obstructive pulmonary disease, unspecified: Secondary | ICD-10-CM | POA: Diagnosis not present

## 2022-01-04 DIAGNOSIS — J431 Panlobular emphysema: Secondary | ICD-10-CM | POA: Diagnosis not present

## 2022-01-04 DIAGNOSIS — I340 Nonrheumatic mitral (valve) insufficiency: Secondary | ICD-10-CM | POA: Diagnosis not present

## 2022-01-04 DIAGNOSIS — R0602 Shortness of breath: Secondary | ICD-10-CM | POA: Diagnosis not present

## 2022-01-04 DIAGNOSIS — G3184 Mild cognitive impairment, so stated: Secondary | ICD-10-CM | POA: Diagnosis not present

## 2022-01-04 DIAGNOSIS — Z7901 Long term (current) use of anticoagulants: Secondary | ICD-10-CM | POA: Diagnosis not present

## 2022-01-04 NOTE — Progress Notes (Deleted)
Cardiology Clinic Note   Patient Name: Ralph Dawson Date of Encounter: 01/04/2022  Primary Care Provider:  Eartha InchBadger, Michael C, MD Primary Cardiologist:  Nicki Guadalajarahomas Kelly, MD  Patient Profile    Ralph Dawson 81 year old male presents to the clinic today for an evaluation of his atrial fibrillation shortness of breath.  Past Medical History    Past Medical History:  Diagnosis Date   Arthritis    "hands, feet" (04/27/2017)   Atrial fibrillation/flutter    a. during admission 01/2019 for PAD angiogram - isolated event after allergic reaction/steroids, monitor planned.   Bradycardia    a. brief episode of nocturnal bradycardia 01/2019 (?NSR with blocked PACs).   CAD (coronary artery disease)    Carotid stenosis 09/24/2011   R ICA patent w/ hx of endarterectomy, stenosis 1-39%;  L ICA patent w/ hx of endarterectomy; external carotids appear patent; see imaging tab for full report   Chest heaviness 02/25/2009   PVCs, atrial bigeminy   CKD (chronic kidney disease), stage III (HCC)    Claudication (HCC) 06/17/2011   LE doppler - bilateral ABIs normal values at rest; R CIA >50% diameter reduction L CIA 0-49% reduction; bilateral SFAs mild/mod mixed density plaque throughout suggesting 50-69% diameter reduction   Contrast media allergy    GERD (gastroesophageal reflux disease)    Hyperlipidemia    Hypertension 02/13/2010   echo - EF >55%; mild mitral annular calcification; mild aortic valve sclerosis   NSTEMI (non-ST elevated myocardial infarction) (HCC) 04/26/2017   [name]/notes 04/27/2017   PAD (peripheral artery disease) (HCC)    a. s/p LLE intervention on 01/2019.   RBBB (right bundle branch block) 02/13/2010   R/P MV - EF 67%; normal perfusion all regions; no significant wall abnormalties noted   Tobacco abuse    Past Surgical History:  Procedure Laterality Date   ABDOMINAL AORTOGRAM W/LOWER EXTREMITY Bilateral 01/12/2019   Procedure: ABDOMINAL AORTOGRAM W/LOWER EXTREMITY;  Surgeon:  Runell GessBerry, Jonathan J, MD;  Location: [facility];  Service: Cardiovascular;  Laterality: Bilateral;   APPENDECTOMY     ARTERIAL BYPASS SURGRY     pt unaware of this OR on 04/27/2017   BACK SURGERY     CAROTID ENDARTERECTOMY Right 07/15/2009   Right CEA   CAROTID ENDARTERECTOMY Left 12/13/2007   Left CEA   CORONARY ANGIOPLASTY WITH STENT PLACEMENT  04/27/2017   CORONARY STENT INTERVENTION N/A 04/27/2017   Procedure: CORONARY STENT INTERVENTION;  Surgeon: Corky CraftsVaranasi, Jayadeep S, MD;  Location: [facility];  Service: Cardiovascular;  Laterality: N/A;   LAPAROSCOPIC CHOLECYSTECTOMY     LEFT HEART CATH AND CORONARY ANGIOGRAPHY N/A 04/27/2017   Procedure: LEFT HEART CATH AND CORONARY ANGIOGRAPHY;  Surgeon: Corky CraftsVaranasi, Jayadeep S, MD;  [facility];  Service: Cardiovascular;  Laterality: N/A;   LUMBAR DISC SURGERY     PERIPHERAL VASCULAR ATHERECTOMY Left 01/12/2019   Procedure: PERIPHERAL VASCULAR ATHERECTOMY;  Surgeon: Runell GessBerry, Jonathan J, MD;  Location: [facility];  Service: Cardiovascular;  Laterality: Left;  SFA   TONSILLECTOMY      Allergies  Allergies  Allergen Reactions   Plavix [Clopidogrel]     Causes severe pain in hands with dizziness.    Contrast Media [Iodinated Contrast Media] Itching    Caused itching. Possibly caused SVT vs afib post-op   Doxycycline Rash    History of Present Illness    Ralph Dawson is a PMH of carotid stenosis, hypertension, NSTEMI, peripheral arterial disease, paroxysmal atrial  fibrillation, HLD, tobacco abuse, chest discomfort, allergy to contrast media, and leukocytosis.  He underwent cardiac catheterization 04/27/2017 which showed 40% stenosis of his proximal LAD, 25% circumflex stenosis, mid LAD 50% stenosis, LVEDP normal, EF 55-65%, mid circumflex lesion 95% which was treated with PCI and DES x1.  He was seen in follow-up by Dr. Claiborne Billings on 07/30/2021.  During that time he continued to be active and working in his yard.  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  He noted occasional episodes of cramping in his calves with walking.  He continues to smoke cigarettes.  He reported smoking since the age of 62.  He was smoking about 1-half pack per day.  He reported compliance with his apixaban, aspirin, metoprolol, lisinopril, isosorbide dinitrate and atorvastatin.  His blood pressure was well controlled.  He had been prescribed Aricept for memory issues.  He also was taking inhalers for his lung disease.  Echocardiogram 08/13/2021 showed an EF of 60-65%, mild-moderate mitral valve regurgitation and no other significant valvular abnormalities.  He presents to the clinic today for follow-up evaluation states***  *** denies chest pain, shortness of breath, lower extremity edema, fatigue, palpitations, melena, hematuria, hemoptysis, diaphoresis, weakness, presyncope, syncope, orthopnea, and PND.   Home Medications    Prior to Admission medications   Medication Sig Start Date End Date Taking? Authorizing Provider  acetaminophen (TYLENOL) 650 MG CR tablet Take 1 tablet (650 mg total) by mouth every 8 (eight) hours as needed for pain. 03/16/19   Deberah Pelton, NP  albuterol (PROVENTIL HFA;VENTOLIN HFA) 108 (90 Base) MCG/ACT inhaler Inhale 1-2 puffs into the lungs every 6 (six) hours as needed (wheezing/shortness of breath.).  03/29/18 07/30/21  [provider]  apixaban (ELIQUIS) 5 MG TABS tablet TAKE 1 TABLET BY MOUTH TWICE A DAY 07/21/21   Troy Sine, MD  atorvastatin (LIPITOR) 80 MG tablet TAKE 1 TABLET BY MOUTH EVERY DAY AT 6PM Patient taking differently: Take 80 mg by mouth at bedtime. 12/09/17   Troy Sine, MD  azelastine (ASTELIN) 0.1 % nasal spray one spray by Both Nostrils route 2 (two) times daily. Use in each nostril as directed 06/06/21   [provider]  diclofenac Sodium (VOLTAREN) 1 % GEL Apply 2 g topically 4 (four) times daily. 03/20/19   Lendon Colonel, NP  donepezil (ARICEPT) 5  MG tablet Take 5 mg by mouth at bedtime.  05/13/17   [provider]  DULoxetine (CYMBALTA) 60 MG capsule Take 60 mg by mouth at bedtime.  Patient not taking: Reported on 07/30/2021    [provider]  ipratropium (ATROVENT) 0.06 % nasal spray Place 1 spray into the nose 3 (three) times daily as needed (allergies/congestion.).  04/12/17   [provider]  isosorbide dinitrate (ISORDIL) 30 MG tablet Take 15 mg by mouth at bedtime. 10/24/18   [provider]  lisinopril (ZESTRIL) 5 MG tablet Take 5 mg by mouth at bedtime.    [provider]  Menthol-Methyl Salicylate (MUSCLE RUB) 10-15 % CREA Apply 1 application topically 4 (four) times daily as needed for muscle pain.     [provider]  metoprolol succinate (TOPROL-XL) 25 MG 24 hr tablet TAKE 1 TABLET BY MOUTH EVERY DAY 06/09/21   Troy Sine, MD  nitroGLYCERIN (NITROSTAT) 0.4 MG SL tablet Place 1 tablet (0.4 mg total) under the tongue every 5 (five) minutes as needed for chest pain. 04/28/17   Lyda Jester M, PA-C  omeprazole (PRILOSEC) 20  MG capsule Take 20 mg by mouth at bedtime.    [provider]  tamsulosin (FLOMAX) 0.4 MG CAPS Take 0.4 mg by mouth at bedtime.     [provider]  ticagrelor (BRILINTA) 90 MG TABS tablet Take 1 tablet (90 mg total) by mouth 2 (two) times daily. 06/12/20   Lorretta Harp, MD    Family History    Family History  Problem Relation Age of Onset   Cancer Mother        stomach   Cancer Sister    Diabetes Brother    Cancer Daughter    He indicated that his mother is deceased. He indicated that his father is deceased. He indicated that his sister is alive. He indicated that his brother is alive. He indicated that his maternal grandmother is deceased. He indicated that his maternal grandfather is deceased. He indicated that his paternal grandmother is deceased. He indicated that his paternal grandfather is deceased. He indicated that  the status of his daughter is unknown.  Social History    Social History   Socioeconomic History   Marital status: Married    Spouse name: Not on file   Number of children: Not on file   Years of education: Not on file   Highest education level: Not on file  Occupational History   Not on file  Tobacco Use   Smoking status: Some Days    Packs/day: 0.25    Years: 65.00    Total pack years: 16.25    Types: Cigarettes    Last attempt to quit: 06/14/2017    Years since quitting: 4.5   Smokeless tobacco: Never   Tobacco comments:    smoking a 10 cigarettes a week   Vaping Use   Vaping Use: Some days  Substance and Sexual Activity   Alcohol use: No   Drug use: No   Sexual activity: Yes  Other Topics Concern   Not on file  Social History Narrative   Not on file   Social Determinants of Health   Financial Resource Strain: Not on file  Food Insecurity: Not on file  Transportation Needs: Not on file  Physical Activity: Not on file  Stress: Not on file  Social Connections: Not on file  Intimate Partner Violence: Not on file     Review of Systems    General:  No chills, fever, night sweats or weight changes.  Cardiovascular:  No chest pain, dyspnea on exertion, edema, orthopnea, palpitations, paroxysmal nocturnal dyspnea. Dermatological: No rash, lesions/masses Respiratory: No cough, dyspnea Urologic: No hematuria, dysuria Abdominal:   No nausea, vomiting, diarrhea, bright red blood per rectum, melena, or hematemesis Neurologic:  No visual changes, wkns, changes in mental status. All other systems reviewed and are otherwise negative except as noted above.  Physical Exam    VS:  There were no vitals taken for this visit. , BMI There is no height or weight on file to calculate BMI. GEN: Well nourished, well developed, in no acute distress. HEENT: normal. Neck: Supple, no JVD, carotid bruits, or masses. Cardiac: RRR, no murmurs, rubs, or gallops. No clubbing, cyanosis,  edema.  Radials/DP/PT 2+ and equal bilaterally.  Respiratory:  Respirations regular and unlabored, clear to auscultation bilaterally. GI: Soft, nontender, nondistended, BS + x 4. MS: no deformity or atrophy. Skin: warm and dry, no rash. Neuro:  Strength and sensation are intact. Psych: Normal affect.  Accessory Clinical Findings    Recent Labs: 08/07/2021: ALT 18; TSH 1.620  01/02/2022: BUN 19; Creatinine, Ser 1.68; Hemoglobin 12.8; Platelets 187; Potassium 3.7; Sodium 136   Recent Lipid Panel    Component Value Date/Time   CHOL 161 08/07/2021 0936   TRIG 195 (H) 08/07/2021 0936   HDL 38 (L) 08/07/2021 0936   CHOLHDL 4.2 08/07/2021 0936   CHOLHDL 3.0 04/27/2017 0237   VLDL 15 04/27/2017 0237   LDLCALC 90 08/07/2021 0936    No BP recorded.  {Refresh Note OR Click here to enter BP  :1}***    ECG personally reviewed by me today- *** - No acute changes  Echocardiogram 08/13/2021  IMPRESSIONS     1. Left ventricular ejection fraction, by estimation, is 60 to 65%. The  left ventricle has normal function. The left ventricle has no regional  wall motion abnormalities. Left ventricular diastolic parameters were  normal.   2. Right ventricular systolic function is normal. The right ventricular  size is normal. There is normal pulmonary artery systolic pressure.   3. The mitral valve is abnormal. Mild to moderate mitral valve  regurgitation. No evidence of mitral stenosis.   4. The aortic valve was not well visualized. There is moderate  calcification of the aortic valve. There is moderate thickening of the  aortic valve. Aortic valve regurgitation is not visualized. Aortic valve  sclerosis/calcification is present, without  any evidence of aortic stenosis.   5. The inferior vena cava is dilated in size with >50% respiratory  variability, suggesting right atrial pressure of 8 mmHg.  Lower extremity ABIs 08/28/2021 Bilateral ABIs and TBIs appear essentially unchanged compared to  prior  study on 01/19/2019.     Summary:  Right: Resting right ankle-brachial index is within normal range. No  evidence of significant right lower extremity arterial disease. The right  toe-brachial index is abnormal.   Left: Resting left ankle-brachial index is within normal range. No  evidence of significant left lower extremity arterial disease. The left  toe-brachial index is abnormal.    *See table(s) above for measurements and observations.  See arterial duplex report.     Suggest follow up study in 12 months.   Electronically signed by Jenkins Rouge MD on 08/28/2021 at 2:29:27 PM.   Lower extremity arterial's 08/28/2021  Summary:  Left: Atherosclerosis in the common femoral, femoral and popliteal  arteries.  50-74% stenosis in the distal SFA, s/p angioplasty. No signficant change  compared to previous exam.      See table(s) above for measurements and observations.  See ABI report.   Suggest follow up study in 12 months.    Electronically signed by Jenkins Rouge MD on 08/28/2021 at 3:31:36 PM. Assessment & Plan   1.  Coronary artery disease-no chest pain today.  Underwent cardiac catheterization 1/19 which showed mid circumflex lesion 95% stenosis.  Received PCI with DES x1.  Details above. Continue metoprolol, Brilinta Heart healthy low-sodium diet-salty 6 given Increase physical activity as tolerated   Paroxysmal atrial fibrillation-EKG today shows*** Continue apixaban, Imdur, metoprolol Heart healthy low-sodium diet-salty 6 given Increase physical activity as tolerated   Peripheral arterial disease-stable.  Continues with intermittent bouts of claudication.  Recent ABIs and lower extremity arterial.  Details above. Repeat lower extremity arterial's and ABIs 5/24 Continue current medical therapy  Hyperlipidemia-LDL***.  On Brilinta and apixaban. Continue atorvastatin Heart healthy low-sodium high-fiber diet  Tobacco abuse-continues to smoke half pack to a  full pack per day.  Has smoked for around 70 years.  Smoking cessation strongly recommended. Smoking cessation information given  Disposition: Follow-up with Dr. Claiborne Billings or me in 6 months.   Jossie Ng. Candelario Steppe NP-C     01/04/2022, 4:24 PM [facility] [address] Office [phone] Fax [phone]  Notice: This dictation was prepared with Dragon dictation along with smaller phrase technology. Any transcriptional errors that result from this process are unintentional and may not be corrected upon review.  I spent***minutes examining this patient, reviewing medications, and using patient centered shared decision making involving her cardiac care.  Prior to her visit I spent greater than 20 minutes reviewing her past medical history,  medications, and prior cardiac tests.

## 2022-01-05 ENCOUNTER — Ambulatory Visit: Payer: BC Managed Care – PPO | Admitting: General Practice

## 2022-01-05 DIAGNOSIS — R918 Other nonspecific abnormal finding of lung field: Secondary | ICD-10-CM | POA: Diagnosis not present

## 2022-01-05 DIAGNOSIS — I480 Paroxysmal atrial fibrillation: Secondary | ICD-10-CM | POA: Diagnosis not present

## 2022-01-05 DIAGNOSIS — I4891 Unspecified atrial fibrillation: Secondary | ICD-10-CM | POA: Diagnosis not present

## 2022-01-05 DIAGNOSIS — N1831 Chronic kidney disease, stage 3a: Secondary | ICD-10-CM | POA: Diagnosis not present

## 2022-01-05 DIAGNOSIS — J439 Emphysema, unspecified: Secondary | ICD-10-CM | POA: Diagnosis not present

## 2022-01-05 DIAGNOSIS — I082 Rheumatic disorders of both aortic and tricuspid valves: Secondary | ICD-10-CM | POA: Diagnosis not present

## 2022-01-06 DIAGNOSIS — I480 Paroxysmal atrial fibrillation: Secondary | ICD-10-CM | POA: Diagnosis not present

## 2022-01-06 DIAGNOSIS — J431 Panlobular emphysema: Secondary | ICD-10-CM | POA: Diagnosis not present

## 2022-01-06 DIAGNOSIS — I4891 Unspecified atrial fibrillation: Secondary | ICD-10-CM | POA: Diagnosis not present

## 2022-01-06 DIAGNOSIS — I444 Left anterior fascicular block: Secondary | ICD-10-CM | POA: Diagnosis not present

## 2022-01-08 DIAGNOSIS — I480 Paroxysmal atrial fibrillation: Secondary | ICD-10-CM | POA: Diagnosis not present

## 2022-01-08 DIAGNOSIS — R339 Retention of urine, unspecified: Secondary | ICD-10-CM | POA: Diagnosis not present

## 2022-01-08 NOTE — Telephone Encounter (Signed)
Patient seen in ED. Upcoming appointment 10/18

## 2022-01-12 DIAGNOSIS — I10 Essential (primary) hypertension: Secondary | ICD-10-CM | POA: Diagnosis not present

## 2022-01-12 DIAGNOSIS — J449 Chronic obstructive pulmonary disease, unspecified: Secondary | ICD-10-CM | POA: Diagnosis not present

## 2022-01-12 DIAGNOSIS — F17210 Nicotine dependence, cigarettes, uncomplicated: Secondary | ICD-10-CM | POA: Diagnosis not present

## 2022-01-12 DIAGNOSIS — R918 Other nonspecific abnormal finding of lung field: Secondary | ICD-10-CM | POA: Diagnosis not present

## 2022-01-12 DIAGNOSIS — I480 Paroxysmal atrial fibrillation: Secondary | ICD-10-CM | POA: Diagnosis not present

## 2022-01-13 ENCOUNTER — Ambulatory Visit: Payer: BC Managed Care – PPO | Admitting: Cardiovascular Disease

## 2022-01-15 NOTE — Progress Notes (Addendum)
Cardiology Office Note:    Date:  01/28/2022   ID:  Ralph Dawson, DOB 21-Sep-1940, MRN [id number]  PCP:  Chesley Noon, [place] Providers Cardiologist:  Shelva Majestic, MD Cardiology APP:  Ledora Bottcher, [place] { Referring MD: Chesley Noon, MD   Chief Complaint  Patient presents with   Follow-up    3-4 months.   Atrial Fibrillation    History of Present Illness:    Ralph Dawson is a 81 y.o. male with a hx of PAF, RBBB, HTN, CAD, CKD stage III, HLD, and PVD. He has a history of bilateral CEA. He had MI in 2018 treated with DES to Cx. In 01/12/19, he underwent atherectomy with drug coated balloon angioplasty for 95% tandem mid to left SFA stenosis. He had Afib/flutter post procedure thought to be related to allergic reaction and steroids. Zio patch did show Afib with RVR and he was started on eliquis 5 mg BID. He has a history of pain in his hands and feet on plavix, switched to brilinta with resolution of symptoms. He saw Jory Sims NP 05/15/19. Dizziness and lightheadedness had resolves with sinus medication and new glasses. Carotid dopplers were completed and showed no significant disease. I saw him for telehealth visit 02/28/20 and he was doing well at that time. He saw Dr. Claiborne Billings 07/30/21 and was doing well, but still smoking (started at age 56). Echo and lower extremity ABI with duplex obtained.   Since that visit, he was seen in the ER 01/01/22 for Afib, but left prior to being seen. He presented back on 01/04/22 and was admitted for Afib with RVR. He was rate controlled and discharged on lopressor 50 mg BID, eliquis 5 mg BID. He converted to NSR prior to discharge.   He presents today for hospital follow up. He says he feels well. However, he is quite disappointed in [facility] and will not be back. Unclear why - he was frustrated at the wait in the ER and states his paperwork states he has a blockage but is disappointed that he wasn't told where the  blockage was. He is also disappointed in pulmonology, unclear why.   He is compliant on eliquis and brilitna. No bleeding issues, no recent falls.    Past Medical History:  Diagnosis Date   Arthritis    "hands, feet" (04/27/2017)   Atrial fibrillation/flutter    a. during admission 01/2019 for PAD angiogram - isolated event after allergic reaction/steroids, monitor planned.   Bradycardia    a. brief episode of nocturnal bradycardia 01/2019 (?NSR with blocked PACs).   CAD (coronary artery disease)    Carotid stenosis 09/24/2011   R ICA patent w/ hx of endarterectomy, stenosis 1-39%;  L ICA patent w/ hx of endarterectomy; external carotids appear patent; see imaging tab for full report   Chest heaviness 02/25/2009   PVCs, atrial bigeminy   CKD (chronic kidney disease), stage III ([place])    Claudication ([place]) 06/17/2011   LE doppler - bilateral ABIs normal values at rest; R CIA >50% diameter reduction L CIA 0-49% reduction; bilateral SFAs mild/mod mixed density plaque throughout suggesting 50-69% diameter reduction   Contrast media allergy    GERD (gastroesophageal reflux disease)    Hyperlipidemia    Hypertension 02/13/2010   echo - EF >55%; mild mitral annular calcification; mild aortic valve sclerosis   NSTEMI (non-ST elevated myocardial infarction) ([place]) 04/26/2017   Archie Endo 04/27/2017   PAD (peripheral artery disease) ([place])  a. s/p LLE intervention on 01/2019.   RBBB (right bundle branch block) 02/13/2010   R/P MV - EF 67%; normal perfusion all regions; no significant wall abnormalties noted   Tobacco abuse     Past Surgical History:  Procedure Laterality Date   ABDOMINAL AORTOGRAM W/LOWER EXTREMITY Bilateral 01/12/2019   Procedure: ABDOMINAL AORTOGRAM W/LOWER EXTREMITY;  Surgeon: Runell Gess, MD;  Location: [facility] INVASIVE CV LAB;  Service: Cardiovascular;  Laterality: Bilateral;   APPENDECTOMY     ARTERIAL BYPASS SURGRY     pt unaware of this OR on 04/27/2017   BACK SURGERY      CAROTID ENDARTERECTOMY Right 07/15/2009   Right CEA   CAROTID ENDARTERECTOMY Left 12/13/2007   Left CEA   CORONARY ANGIOPLASTY WITH STENT PLACEMENT  04/27/2017   CORONARY STENT INTERVENTION N/A 04/27/2017   Procedure: CORONARY STENT INTERVENTION;  Surgeon: Corky Crafts, MD;  Location: [facility] INVASIVE CV LAB;  Service: Cardiovascular;  Laterality: N/A;   LAPAROSCOPIC CHOLECYSTECTOMY     LEFT HEART CATH AND CORONARY ANGIOGRAPHY N/A 04/27/2017   Procedure: LEFT HEART CATH AND CORONARY ANGIOGRAPHY;  Surgeon: Corky Crafts, MD;  Location: [facility] INVASIVE CV LAB;  Service: Cardiovascular;  Laterality: N/A;   LUMBAR DISC SURGERY     PERIPHERAL VASCULAR ATHERECTOMY Left 01/12/2019   Procedure: PERIPHERAL VASCULAR ATHERECTOMY;  Surgeon: Runell Gess, MD;  Location: [facility] INVASIVE CV LAB;  Service: Cardiovascular;  Laterality: Left;  SFA   TONSILLECTOMY      Current Medications: Current Meds  Medication Sig   acetaminophen (TYLENOL) 650 MG CR tablet Take 1 tablet (650 mg total) by mouth every 8 (eight) hours as needed for pain.   apixaban (ELIQUIS) 5 MG TABS tablet TAKE 1 TABLET BY MOUTH TWICE A DAY   atorvastatin (LIPITOR) 80 MG tablet TAKE 1 TABLET BY MOUTH EVERY DAY AT 6PM (Patient taking differently: Take 80 mg by mouth at bedtime.)   azelastine (ASTELIN) 0.1 % nasal spray one spray by Both Nostrils route 2 (two) times daily. Use in each nostril as directed   diclofenac Sodium (VOLTAREN) 1 % GEL Apply 2 g topically 4 (four) times daily.   donepezil (ARICEPT) 5 MG tablet Take 5 mg by mouth at bedtime.    DULoxetine (CYMBALTA) 60 MG capsule Take 60 mg by mouth at bedtime.   ezetimibe (ZETIA) 10 MG tablet Take 1 tablet (10 mg total) by mouth daily.   ipratropium (ATROVENT) 0.06 % nasal spray Place 1 spray into the nose 3 (three) times daily as needed (allergies/congestion.).    isosorbide dinitrate (ISORDIL) 30 MG tablet Take 15 mg by mouth at bedtime.   lisinopril (ZESTRIL) 5 MG tablet  Take 5 mg by mouth at bedtime.   Menthol-Methyl Salicylate (MUSCLE RUB) 10-15 % CREA Apply 1 application topically 4 (four) times daily as needed for muscle pain.    metoprolol succinate (TOPROL-XL) 25 MG 24 hr tablet TAKE 1 TABLET BY MOUTH EVERY DAY   nitroGLYCERIN (NITROSTAT) 0.4 MG SL tablet Place 1 tablet (0.4 mg total) under the tongue every 5 (five) minutes as needed for chest pain.   omeprazole (PRILOSEC) 20 MG capsule Take 20 mg by mouth at bedtime.   tamsulosin (FLOMAX) 0.4 MG CAPS Take 0.4 mg by mouth at bedtime.    ticagrelor (BRILINTA) 90 MG TABS tablet Take 1 tablet (90 mg total) by mouth 2 (two) times daily.     Allergies:   Plavix [clopidogrel], Contrast media [iodinated contrast media], and Doxycycline  Social History   Socioeconomic History   Marital status: Married    Spouse name: Not on file   Number of children: Not on file   Years of education: Not on file   Highest education level: Not on file  Occupational History   Not on file  Tobacco Use   Smoking status: Some Days    Packs/day: 0.25    Years: 65.00    Total pack years: 16.25    Types: Cigarettes    Last attempt to quit: 06/14/2017    Years since quitting: 4.6   Smokeless tobacco: Never   Tobacco comments:    smoking a 10 cigarettes a week   Vaping Use   Vaping Use: Some days  Substance and Sexual Activity   Alcohol use: No   Drug use: No   Sexual activity: Yes  Other Topics Concern   Not on file  Social History Narrative   Not on file   Social Determinants of Health   Financial Resource Strain: Not on file  Food Insecurity: Not on file  Transportation Needs: Not on file  Physical Activity: Not on file  Stress: Not on file  Social Connections: Not on file     Family History: The patient's family history includes Cancer in his daughter, mother, and sister; Diabetes in his brother.  ROS:   Please see the history of present illness.     All other systems reviewed and are  negative.  EKGs/Labs/Other Studies Reviewed:    The following studies were reviewed today:   EKG:  EKG is  ordered today.  The ekg ordered today demonstrates sinus rhythm, sinus arrhythmia HR 71, PACs, RBBB, left axis deviation  Recent Labs: 08/07/2021: ALT 18; TSH 1.620 01/02/2022: BUN 19; Creatinine, Ser 1.68; Hemoglobin 12.8; Platelets 187; Potassium 3.7; Sodium 136  Recent Lipid Panel    Component Value Date/Time   CHOL 161 08/07/2021 0936   TRIG 195 (H) 08/07/2021 0936   HDL 38 (L) 08/07/2021 0936   CHOLHDL 4.2 08/07/2021 0936   CHOLHDL 3.0 04/27/2017 0237   VLDL 15 04/27/2017 0237   LDLCALC 90 08/07/2021 0936     Risk Assessment/Calculations:    CHA2DS2-VASc Score = 4   This indicates a 4.8% annual risk of stroke. The patient's score is based upon: CHF History: 0 HTN History: 1 Diabetes History: 0 Stroke History: 0 Vascular Disease History: 1 Age Score: 2 Gender Score: 0            Physical Exam:    VS:  BP (!) 128/50   Pulse (!) 58   Ht 5\' 8"  (1.727 m)   Wt 160 lb (72.6 kg)   BMI 24.33 kg/m     Wt Readings from Last 3 Encounters:  01/28/22 160 lb (72.6 kg)  07/30/21 154 lb 3.2 oz (69.9 kg)  02/28/20 152 lb (68.9 kg)     GEN:  Well nourished, well developed in no acute distress HEENT: Normal NECK: No JVD; No carotid bruits LYMPHATICS: No lymphadenopathy CARDIAC: RRR, no murmurs, rubs, gallops RESPIRATORY:  wheezing, no crackles ABDOMEN: Soft, non-tender, non-distended MUSCULOSKELETAL:  No edema; No deformity  SKIN: Warm and dry NEUROLOGIC:  Alert and oriented x 3 PSYCHIATRIC:  Normal affect   ASSESSMENT:    1. Paroxysmal atrial fibrillation (HCC)   2. Chronic anticoagulation   3. Coronary artery disease involving native coronary artery of native heart without angina pectoris   4. Non-ST elevation (NSTEMI) myocardial infarction ([place])   5. Hyperlipidemia with  target LDL less than 70   6. Bilateral carotid artery stenosis   7. PAD  (peripheral artery disease) ([place])   8. Tobacco abuse    PLAN:    In order of problems listed above:  PAF Afib RVR Need for chronic anticoagulation Continue BB and eliquis EKG today shows sinus rhythm with sinus arrhythmia and PAC   CAD NSTEMI 2018 DES to CX - DAPT with ASA and brilinta - has been intolerant to plavix in the past - no chest pain - he is now on Brilinta 90 mg BID and eliquis - I will reach out to both Dr. Gwenlyn Found and Dr. Claiborne Billings to clarify this - does he need to be on ASA and eliquis instead vs eliquis and 60 mg brilinta BID? - no chest pain   Carotid artery stenosis - last dopplers 05/2019 - no significant disease   PAD - s/p atherectomy left mid SFA - no claudication - remains on eliquis and brilinta - will clarify this as above   Ongoing tobacco abuse - he knows he needs to quit - he is uninterested in smoking cessation - 1 pack lasts 2.5 days now   Hyperlipidemia with LDL goal less than 70 Lipid panel 07/2021: Total 161 HDL 38 LDL 90 Trig 195 On 80 mg of lipitor. Will add 10 mg zetia.  Can check labs in 3 months with PCP and fax to [place].    Follow up in 1 year.     ADDENDUM: PER DR BERRY AND DR KELLY, PATIENT SHOULD BE ON ASA AND ELIQUIS. DISCONTINUE BRILINTA.   Medication Adjustments/Labs and Tests Ordered: Current medicines are reviewed at length with the patient today.  Concerns regarding medicines are outlined above.  No orders of the defined types were placed in this encounter.  Meds ordered this encounter  Medications   ezetimibe (ZETIA) 10 MG tablet    Sig: Take 1 tablet (10 mg total) by mouth daily.    Dispense:  90 tablet    Refill:  3    Patient Instructions  Medication Instructions:  Start Zetia 10 mg daily  *If you need a refill on your cardiac medications before your next appointment, please call your pharmacy*   Lab Work: NONE ordered at this time of appointment  \you have labs (blood work) drawn today and your tests  are completely normal, you will receive your results only by: [place] (if you have MyChart) OR A paper copy in the mail If you have any lab test that is abnormal or we need to change your treatment, we will call you to review the results.   Testing/Procedures: NONE ordered at this time of appointment    Follow-Up: At [facility], you and your health needs are our priority.  As part of our continuing mission to provide you with exceptional heart care, we have created designated Provider Care Teams.  These Care Teams include your primary Cardiologist (physician) and Advanced Practice Providers (APPs -  Physician Assistants and Nurse Practitioners) who all work together to provide you with the care you need, when you need it.  We recommend signing up for the patient portal called "MyChart".  Sign up information is provided on this After Visit Summary.  MyChart is used to connect with patients for Virtual Visits (Telemedicine).  Patients are able to view lab/test results, encounter notes, upcoming appointments, etc.  Non-urgent messages can be sent to your provider as well.   To learn more about what you can do  with MyChart, go to [email or web address].    Your next appointment:   1 year(s)  The format for your next appointment:   In Person  Provider:   Shelva Majestic, MD     Other Instructions Please have Fasting Lipid panel drawn Spring 2024 with your Primary Doctor. Please Fax results to our office.   Important Information About Sugar         Signed, Ledora Bottcher, [place]  01/28/2022 1:42 PM    [facility]

## 2022-01-21 DIAGNOSIS — I700 Atherosclerosis of aorta: Secondary | ICD-10-CM | POA: Diagnosis not present

## 2022-01-21 DIAGNOSIS — R918 Other nonspecific abnormal finding of lung field: Secondary | ICD-10-CM | POA: Diagnosis not present

## 2022-01-21 DIAGNOSIS — N281 Cyst of kidney, acquired: Secondary | ICD-10-CM | POA: Diagnosis not present

## 2022-01-21 DIAGNOSIS — I708 Atherosclerosis of other arteries: Secondary | ICD-10-CM | POA: Diagnosis not present

## 2022-01-28 ENCOUNTER — Ambulatory Visit: Payer: BC Managed Care – PPO | Attending: Cardiovascular Disease | Admitting: Physician Assistant

## 2022-01-28 ENCOUNTER — Encounter: Payer: Self-pay | Admitting: Physician Assistant

## 2022-01-28 VITALS — BP 128/50 | HR 58 | Ht 68.0 in | Wt 160.0 lb

## 2022-01-28 DIAGNOSIS — I2510 Atherosclerotic heart disease of native coronary artery without angina pectoris: Secondary | ICD-10-CM | POA: Diagnosis not present

## 2022-01-28 DIAGNOSIS — I480 Paroxysmal atrial fibrillation: Secondary | ICD-10-CM | POA: Diagnosis not present

## 2022-01-28 DIAGNOSIS — Z720 Tobacco use: Secondary | ICD-10-CM

## 2022-01-28 DIAGNOSIS — I214 Non-ST elevation (NSTEMI) myocardial infarction: Secondary | ICD-10-CM | POA: Diagnosis not present

## 2022-01-28 DIAGNOSIS — I739 Peripheral vascular disease, unspecified: Secondary | ICD-10-CM

## 2022-01-28 DIAGNOSIS — Z7901 Long term (current) use of anticoagulants: Secondary | ICD-10-CM | POA: Diagnosis not present

## 2022-01-28 DIAGNOSIS — E785 Hyperlipidemia, unspecified: Secondary | ICD-10-CM

## 2022-01-28 DIAGNOSIS — I6523 Occlusion and stenosis of bilateral carotid arteries: Secondary | ICD-10-CM

## 2022-01-28 MED ORDER — EZETIMIBE 10 MG PO TABS: 10.0000 mg | ORAL_TABLET | Freq: Every day | ORAL | 3 refills | Status: DC

## 2022-01-28 NOTE — Patient Instructions (Addendum)
Medication Instructions:  Start Zetia 10 mg daily  *If you need a refill on your cardiac medications before your next appointment, please call your pharmacy*   Lab Work: NONE ordered at this time of appointment  \ If you have labs (blood work) drawn today and your tests are completely normal, you will receive your results only by: [place] (if you have MyChart) OR A paper copy in the mail If you have any lab test that is abnormal or we need to change your treatment, we will call you to review the results.   Testing/Procedures: NONE ordered at this time of appointment    Follow-Up: At [facility], you and your health needs are our priority.  As part of our continuing mission to provide you with exceptional heart care, we have created designated Provider Care Teams.  These Care Teams include your primary Cardiologist (physician) and Advanced Practice Providers (APPs -  Physician Assistants and Nurse Practitioners) who all work together to provide you with the care you need, when you need it.  We recommend signing up for the patient portal called "MyChart".  Sign up information is provided on this After Visit Summary.  MyChart is used to connect with patients for Virtual Visits (Telemedicine).  Patients are able to view lab/test results, encounter notes, upcoming appointments, etc.  Non-urgent messages can be sent to your provider as well.   To learn more about what you can do with MyChart, go to [email or web address].    Your next appointment:   1 year(s)  The format for your next appointment:   In Person  Provider:   Shelva Majestic, MD     Other Instructions Please have Fasting Lipid panel drawn Spring 2024 with your Primary Doctor. Please Fax results to our office.   Important Information About Sugar

## 2022-01-28 NOTE — Addendum Note (Signed)
Addended by: Derrick Ravel on: 36/64/4034 01:47 PM   Modules accepted: Orders

## 2022-01-30 ENCOUNTER — Telehealth: Payer: Self-pay | Admitting: *Deleted

## 2022-01-30 DIAGNOSIS — F17210 Nicotine dependence, cigarettes, uncomplicated: Secondary | ICD-10-CM | POA: Diagnosis not present

## 2022-01-30 DIAGNOSIS — J449 Chronic obstructive pulmonary disease, unspecified: Secondary | ICD-10-CM | POA: Diagnosis not present

## 2022-01-30 DIAGNOSIS — I10 Essential (primary) hypertension: Secondary | ICD-10-CM | POA: Diagnosis not present

## 2022-01-30 DIAGNOSIS — R918 Other nonspecific abnormal finding of lung field: Secondary | ICD-10-CM | POA: Diagnosis not present

## 2022-01-30 NOTE — Telephone Encounter (Signed)
Spoke to patient, patient states he has been off Brilinta for a few months.  He is currently taking Eliquis and aspirin 81 mg daily.   Med list corrected.

## 2022-01-30 NOTE — Telephone Encounter (Signed)
-----   Message from Ledora Bottcher, [place] sent at 01/29/2022  5:08 PM EDT ----- Pt stated he was taking Brilinta and eliquis.   Please let the pt know he should be on ASA and eliquis. Please stop brilinta - per Dr. Gwenlyn Found and Dr. Claiborne Billings.   I send the note addendum to you.  Thanks so much Angie

## 2022-02-05 DIAGNOSIS — I10 Essential (primary) hypertension: Secondary | ICD-10-CM | POA: Diagnosis not present

## 2022-02-05 DIAGNOSIS — Z716 Tobacco abuse counseling: Secondary | ICD-10-CM | POA: Diagnosis not present

## 2022-02-05 DIAGNOSIS — F17210 Nicotine dependence, cigarettes, uncomplicated: Secondary | ICD-10-CM | POA: Diagnosis not present

## 2022-02-05 DIAGNOSIS — J431 Panlobular emphysema: Secondary | ICD-10-CM | POA: Diagnosis not present

## 2022-02-05 DIAGNOSIS — R918 Other nonspecific abnormal finding of lung field: Secondary | ICD-10-CM | POA: Diagnosis not present

## 2022-02-05 DIAGNOSIS — R0602 Shortness of breath: Secondary | ICD-10-CM | POA: Diagnosis not present

## 2022-02-06 ENCOUNTER — Telehealth: Payer: Self-pay | Admitting: Cardiovascular Disease

## 2022-02-06 MED ORDER — METOPROLOL SUCCINATE ER 25 MG PO TB24: 25.0000 mg | ORAL_TABLET | Freq: Every day | ORAL | 3 refills | Status: DC

## 2022-02-06 NOTE — Telephone Encounter (Signed)
*  STAT* If patient is at the pharmacy, call can be transferred to refill team.   1. Which medications need to be refilled? (please list name of each medication and dose if known) metoprolol succinate (TOPROL-XL) 25 MG 24 hr tablet  2. Which pharmacy/location (including street and city if local pharmacy) is medication to be sent to?  [id number] - [place], [place] - [address]  3. Do they need a 30 day or 90 day supply? 41  Pt spouse called stating pt is out

## 2022-02-13 DIAGNOSIS — I444 Left anterior fascicular block: Secondary | ICD-10-CM | POA: Diagnosis not present

## 2022-02-13 DIAGNOSIS — I4891 Unspecified atrial fibrillation: Secondary | ICD-10-CM | POA: Diagnosis not present

## 2022-02-13 DIAGNOSIS — G3184 Mild cognitive impairment, so stated: Secondary | ICD-10-CM | POA: Diagnosis not present

## 2022-02-13 DIAGNOSIS — K219 Gastro-esophageal reflux disease without esophagitis: Secondary | ICD-10-CM | POA: Diagnosis not present

## 2022-02-13 DIAGNOSIS — J431 Panlobular emphysema: Secondary | ICD-10-CM | POA: Diagnosis not present

## 2022-02-13 DIAGNOSIS — F419 Anxiety disorder, unspecified: Secondary | ICD-10-CM | POA: Diagnosis not present

## 2022-02-13 DIAGNOSIS — N1831 Chronic kidney disease, stage 3a: Secondary | ICD-10-CM | POA: Diagnosis not present

## 2022-02-13 DIAGNOSIS — R911 Solitary pulmonary nodule: Secondary | ICD-10-CM | POA: Diagnosis not present

## 2022-02-13 DIAGNOSIS — F3340 Major depressive disorder, recurrent, in remission, unspecified: Secondary | ICD-10-CM | POA: Diagnosis not present

## 2022-02-13 DIAGNOSIS — I129 Hypertensive chronic kidney disease with stage 1 through stage 4 chronic kidney disease, or unspecified chronic kidney disease: Secondary | ICD-10-CM | POA: Diagnosis not present

## 2022-02-13 DIAGNOSIS — R846 Abnormal cytological findings in specimens from respiratory organs and thorax: Secondary | ICD-10-CM | POA: Diagnosis not present

## 2022-02-13 DIAGNOSIS — I480 Paroxysmal atrial fibrillation: Secondary | ICD-10-CM | POA: Diagnosis not present

## 2022-02-13 DIAGNOSIS — M5416 Radiculopathy, lumbar region: Secondary | ICD-10-CM | POA: Diagnosis not present

## 2022-02-13 DIAGNOSIS — E785 Hyperlipidemia, unspecified: Secondary | ICD-10-CM | POA: Diagnosis not present

## 2022-02-13 DIAGNOSIS — I517 Cardiomegaly: Secondary | ICD-10-CM | POA: Diagnosis not present

## 2022-02-13 DIAGNOSIS — R590 Localized enlarged lymph nodes: Secondary | ICD-10-CM | POA: Diagnosis not present

## 2022-02-13 DIAGNOSIS — I4510 Unspecified right bundle-branch block: Secondary | ICD-10-CM | POA: Diagnosis not present

## 2022-02-13 DIAGNOSIS — F17210 Nicotine dependence, cigarettes, uncomplicated: Secondary | ICD-10-CM | POA: Diagnosis not present

## 2022-02-13 DIAGNOSIS — Z9889 Other specified postprocedural states: Secondary | ICD-10-CM | POA: Diagnosis not present

## 2022-02-23 ENCOUNTER — Telehealth: Payer: Self-pay | Admitting: Cardiovascular Disease

## 2022-02-23 DIAGNOSIS — I10 Essential (primary) hypertension: Secondary | ICD-10-CM | POA: Diagnosis not present

## 2022-02-23 DIAGNOSIS — R0602 Shortness of breath: Secondary | ICD-10-CM | POA: Diagnosis not present

## 2022-02-23 DIAGNOSIS — R339 Retention of urine, unspecified: Secondary | ICD-10-CM | POA: Diagnosis not present

## 2022-02-23 DIAGNOSIS — I480 Paroxysmal atrial fibrillation: Secondary | ICD-10-CM | POA: Diagnosis not present

## 2022-02-23 DIAGNOSIS — R059 Cough, unspecified: Secondary | ICD-10-CM | POA: Diagnosis not present

## 2022-02-23 NOTE — Telephone Encounter (Signed)
Pt c/o Shortness Of Breath: STAT if SOB developed within the last 24 hours or pt is noticeably SOB on the phone  1. Are you currently SOB (can you hear that pt is SOB on the phone)?   Yes  2. How long have you been experiencing SOB?   Has gotten worse over the past week  3. Are you SOB when sitting or when up moving around?   When moving around  4. Are you currently experiencing any other symptoms?   Dizziness   Caller stated the patient is having elevated HR with his afib.

## 2022-02-23 NOTE — Telephone Encounter (Signed)
Patient at PCP office with new onset SOB, dizziness, and weight gain. Patient is on Eliquis, and 50 Lopressor BID (per PCP ) but according to our records he is on 25 Toprolol XL Qdaily. Patient got EKG in office today that read HR 120 but it has been up and down with AFib/A.Flutter. He had a Bronchoscopy recently and has been taking 10 of Prednisone since 10/10 and that was decreased to 5mg   on 10/26. Patient has a pulmonogy appointment on Friday, 11/17; BNP was elevated weight was up by 5 lbs since last week.  She encouraged pt to have f/u with Cardiology as he seems rate controlled at moment.  Patient schedule with Dr. 06-17-1996 on Wed. 11/15 @ 11:15a. Agreed with appointment and wants cardiology to weigh in due to complex nature of patient. Sending copy of EKG with patient and via fax for review at appt.

## 2022-02-25 ENCOUNTER — Other Ambulatory Visit: Payer: Self-pay | Admitting: Cardiovascular Disease

## 2022-02-25 ENCOUNTER — Encounter: Payer: Self-pay | Admitting: Cardiovascular Disease

## 2022-02-25 ENCOUNTER — Ambulatory Visit: Payer: BC Managed Care – PPO | Attending: Cardiovascular Disease | Admitting: Cardiovascular Disease

## 2022-02-25 VITALS — BP 100/60 | HR 59 | Ht 68.0 in | Wt 167.8 lb

## 2022-02-25 DIAGNOSIS — Z720 Tobacco use: Secondary | ICD-10-CM

## 2022-02-25 DIAGNOSIS — I739 Peripheral vascular disease, unspecified: Secondary | ICD-10-CM

## 2022-02-25 DIAGNOSIS — I25119 Atherosclerotic heart disease of native coronary artery with unspecified angina pectoris: Secondary | ICD-10-CM

## 2022-02-25 DIAGNOSIS — E785 Hyperlipidemia, unspecified: Secondary | ICD-10-CM

## 2022-02-25 DIAGNOSIS — N1832 Chronic kidney disease, stage 3b: Secondary | ICD-10-CM

## 2022-02-25 DIAGNOSIS — Z7901 Long term (current) use of anticoagulants: Secondary | ICD-10-CM | POA: Diagnosis not present

## 2022-02-25 DIAGNOSIS — I6523 Occlusion and stenosis of bilateral carotid arteries: Secondary | ICD-10-CM

## 2022-02-25 DIAGNOSIS — I480 Paroxysmal atrial fibrillation: Secondary | ICD-10-CM | POA: Diagnosis not present

## 2022-02-25 DIAGNOSIS — I452 Bifascicular block: Secondary | ICD-10-CM

## 2022-02-25 DIAGNOSIS — I482 Chronic atrial fibrillation, unspecified: Secondary | ICD-10-CM

## 2022-02-25 MED ORDER — APIXABAN 2.5 MG PO TABS: 2.5000 mg | ORAL_TABLET | Freq: Two times a day (BID) | ORAL | 2 refills | Status: DC

## 2022-02-25 MED ORDER — LISINOPRIL 5 MG PO TABS: 2.5000 mg | ORAL_TABLET | Freq: Every day | ORAL | 3 refills | Status: DC

## 2022-02-25 MED ORDER — METOPROLOL TARTRATE 50 MG PO TABS: ORAL_TABLET | ORAL | 3 refills | Status: DC

## 2022-02-25 NOTE — Progress Notes (Signed)
.   Cardiology Office Note    Date:  03/03/2022   ID:  Ralph Dawson, DOB 13-Aug-1940, MRN [id number]  PCP:  Chesley Noon, MD  Cardiologist:  Shelva Majestic, MD   7 month F/U   History of Present Illness:  Ralph Dawson is a 81 y.o. male who has PVD and underwent left carotid endarterectomy in September 2009 and in April 2011 underwent right carotid endarterectomy. He has a history of lower extremity intermittent claudication, hyperlipidemia, as well as mild valvular heart disease with mild mitral annular calcification with mild MR, mild TR, and mild aortic valve sclerosis with normal systolic and diastolic function. A nuclear perfusion study November 2011 showed normal perfusion. He has had GERD symptoms which have improved with pantoprazole. He was started on pravastatin 80 mg by Dr. Edrick Oh and last year his LDL cholesterol  was 65. He does have documented right bundle branch block.     When I saw him he denied any episodes of chest pain or palpitations.  He has a long-standing tobacco history and continues to smoke, now only 8 cigarettes per day. He has noticed some mild shortness of breath, particularly with significant activity.  He has a history of GERD for which he takes protonix.  He has been taking Cymbalta for depression.   In November 2015 an echo Doppler study showed mild left ventricular hypertrophy with hyperdynamic LV function and ejection fraction of 65-70%.  There was grade 1 diastolic dysfunction.   Unfortunately he continues to smoke and has been smoking for 70 years.  He was seen by Almyra Deforest in January 2018. In April 27 2017 he underwent cardiac catheterization by Dr. Irish Lack found to have a 95% proximal circumflex stenosis which required PCI and use of a Guideliner to get the stent around the proximal bend in the circumflex vessel.  Echo Doppler study revealed an EF of 60-65%. There were no significant valvular abnormalities.     Since I last saw him in April  2019, he was evaluated by Jory Sims in October 2019.  At that time he was without complaints from a cardiac standpoint.  He had been treated for pneumonia by his primary physician.  Unfortunately he was still smoking cigarettes.  He did note occasional shortness of breath when taking Brilinta but otherwise remained stable.     He was evaluated by me in a telemedicine visit on December 06, 2018.  He continues to smoke cigarettes and at that time denied any chest pain or palpitations.  He had experienced some left calf discomfort with walking.  He admitted to shortness of breath with activity.  I saw him on February 27, 2019 and prior to that evaluation he was evaluated by Dr. Gwenlyn Found on December 27, 2018 after undergoing Dopplers which I ordered on December 20, 2018  that revealed a left ABI of 0.52 with high-frequency signals in his left common femoral and distal left SFA.  He underwent peripheral angiography in January 12, 2019 demonstrating 95% tandem mid left SFA stenosis with three-vessel runoff.  He had a Hawk 1 directional atherectomy followed by drug coated balloon angioplasty with an excellent angiographic result.  Post procedure the patient had some tachyarrhythmias on telemetry which appeared to be PAF versus atrial flutter.  He wore a 2-week Zio patch monitor for further evaluation.  His cardiac monitor revealed atrial fibrillation with RVR and as result since he was in need for anticoagulation, his antiplatelet therapy was changed to  Plavix from Brilinta and he was started on apixaban.  He was told to increase his metoprolol dose to 50 mg but apparently did not tolerate this and therefore was only been taking 25 mg daily.  During that evaluation his blood pressure was stable on lisinopril 5 mg in addition to isosorbide.  After not having seen him in 2-1/2 years, I last saw him on July 30, 2021.  At that time he continued to be active and was working in his yard.   At times, he notes the cramps  in his calves with walking.  He continues to smoke cigarettes and has been smoking for ~70 years having started at age 31.  He currently is smoking 1 pack/day down from 1-1/2 packs/day.  Because of his history of atrial fibrillation he was on anticoagulation with Eliquis.  He continued to take aspirin 81 mg, metoprolol succinate 25 mg, lisinopril 5 mg,  isosorbide dinitrate 15 mg at bedtime and atorvastatin 80 mg daily.  During that evaluation he was in sinus rhythm with right bundle branch block and left anterior hemiblock and a sinus arrhythmia.  He has been followed by Dr. Melford Aase for primary care and was now on Aricept 5 mg for memory issues.  With his bilateral carotid endarterectomies and PVD I recommended he undergo lower extremity arterial Doppler studies as well as bilateral carotid duplex imaging.  I also schedule him for 2D echo Doppler study for reassessment of systolic and diastolic function.  He underwent his echo Doppler study on Aug 13, 2021 which showed normal LV function with EF 60 to 65%.  He had normal diastolic parameters.  There was aortic valve sclerosis without stenosis.  Carotid imaging showed mild bilateral carotid plaque, status post bilateral CEA.  He had normal vertebral flow and subclavian artery flow.  Lower extremity Doppler study showed normal ABIs unchanged from prior evaluation.  He was evaluated by Fabian Sharp, PA in January 28, 2022 following an ER evaluation on January 01, 2022 when he was in atrial fibrillation but left prior to being seen.  He presented back to the ER in January 04, 2022 and was admitted for A-fib with RVR.  His rate was controlled and he was discharged on Lopressor 50 mg twice a day, Eliquis 5 mg twice a day and he had converted to sinus rhythm prior to discharge.  At her follow-up, his ECG showed sinus rhythm with mild sinus arrhythmia heart rate at 71 with PACs, right bundle branch block and left axis deviation.  At that time, it appeared that he was  on Brilinta in addition to Eliquis and it was recommended he discontinue Brilinta and continue aspirin/Eliquis.  He has undergone subsequent evaluations at [facility] and is followed for his panlobular emphysema, with lung nodules and there was discussion concerning bronchoscopy versus conservative approach.  Presently, Ralph Dawson denies chest pain.  He was evaluated at [facility] on February 23, 2022 by Reinaldo Berber, PA-C and was in atrial fibrillation/flutter with a ventricular rate at 120 bpm.  Blood pressure has been stable but low.   He admits to occasional cough.  At that time he was on metoprolol 50 mg twice a day.  He again was advised to reduce his tobacco use.  He was suggested he try Mucinex for his cough and sore throat.  Laboratory was drawn and potassium was 4.8 and creatinine 1.8.  Presently he is on aspirin 81 mg, Eliquis 5 mg twice a day for anticoagulation.  He is on  Zetia 10 mg and atorvastatin 80 mg for hyperlipidemia.  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  He is on Cymbalta for anxiety.  He is not having any anginal symptoms and continues to be on low-dose isosorbide dinitrate 50 mg at bedtime.  He was worked into my schedule today and presents for evaluation.   Past Medical History:  Diagnosis Date   Arthritis    "hands, feet" (04/27/2017)   Atrial fibrillation/flutter    a. during admission 01/2019 for PAD angiogram - isolated event after allergic reaction/steroids, monitor planned.   Bradycardia    a. brief episode of nocturnal bradycardia 01/2019 (?NSR with blocked PACs).   CAD (coronary artery disease)    Carotid stenosis 09/24/2011   R ICA patent w/ hx of endarterectomy, stenosis 1-39%;  L ICA patent w/ hx of endarterectomy; external carotids appear patent; see imaging tab for full report   Chest heaviness 02/25/2009   PVCs, atrial bigeminy   CKD (chronic kidney disease), stage III ([place])    Claudication ([place])  06/17/2011   LE doppler - bilateral ABIs normal values at rest; R CIA >50% diameter reduction L CIA 0-49% reduction; bilateral SFAs mild/mod mixed density plaque throughout suggesting 50-69% diameter reduction   Contrast media allergy    GERD (gastroesophageal reflux disease)    Hyperlipidemia    Hypertension 02/13/2010   echo - EF >55%; mild mitral annular calcification; mild aortic valve sclerosis   NSTEMI (non-ST elevated myocardial infarction) ([place]) 04/26/2017   Archie Endo 04/27/2017   PAD (peripheral artery disease) ([place])    a. s/p LLE intervention on 01/2019.   RBBB (right bundle branch block) 02/13/2010   R/P MV - EF 67%; normal perfusion all regions; no significant wall abnormalties noted   Tobacco abuse     Past Surgical History:  Procedure Laterality Date   ABDOMINAL AORTOGRAM W/LOWER EXTREMITY Bilateral 01/12/2019   Procedure: ABDOMINAL AORTOGRAM W/LOWER EXTREMITY;  Surgeon: Lorretta Harp, MD;  Location: [facility];  Service: Cardiovascular;  Laterality: Bilateral;   APPENDECTOMY     ARTERIAL BYPASS SURGRY     pt unaware of this OR on 04/27/2017   BACK SURGERY     CAROTID ENDARTERECTOMY Right 07/15/2009   Right CEA   CAROTID ENDARTERECTOMY Left 12/13/2007   Left CEA   CORONARY ANGIOPLASTY WITH STENT PLACEMENT  04/27/2017   CORONARY STENT INTERVENTION N/A 04/27/2017   Procedure: CORONARY STENT INTERVENTION;  Surgeon: Jettie Booze, MD;  Location: [facility];  Service: Cardiovascular;  Laterality: N/A;   LAPAROSCOPIC CHOLECYSTECTOMY     LEFT HEART CATH AND CORONARY ANGIOGRAPHY N/A 04/27/2017   Procedure: LEFT HEART CATH AND CORONARY ANGIOGRAPHY;  Surgeon: Jettie Booze, MD;  Location: [facility];  Service: Cardiovascular;  Laterality: N/A;   LUMBAR DISC SURGERY     PERIPHERAL VASCULAR ATHERECTOMY Left 01/12/2019   Procedure: PERIPHERAL VASCULAR ATHERECTOMY;  Surgeon: Lorretta Harp, MD;  Location: [facility];  Service:  Cardiovascular;  Laterality: Left;  SFA   TONSILLECTOMY      Current Medications: Outpatient Medications Prior to Visit  Medication Sig Dispense Refill   acetaminophen (TYLENOL) 650 MG CR tablet Take 1 tablet (650 mg total) by mouth every 8 (eight) hours as needed for pain. 150 tablet 6   albuterol (PROVENTIL HFA;VENTOLIN HFA) 108 (90 Base) MCG/ACT inhaler Inhale 1-2 puffs into the lungs every 6 (six) hours as needed (wheezing/shortness of breath.).  aspirin EC 81 MG tablet Take 81 mg by mouth daily. Swallow whole.     atorvastatin (LIPITOR) 80 MG tablet TAKE 1 TABLET BY MOUTH EVERY DAY AT 6PM (Patient taking differently: Take 80 mg by mouth at bedtime.) 90 tablet 0   Cholecalciferol 125 MCG (5000 UT) capsule Take 1 capsule by mouth every morning.     DULoxetine (CYMBALTA) 60 MG capsule Take 60 mg by mouth at bedtime.     ezetimibe (ZETIA) 10 MG tablet Take 1 tablet (10 mg total) by mouth daily. 90 tablet 3   ipratropium (ATROVENT) 0.06 % nasal spray Place 1 spray into the nose 3 (three) times daily as needed (allergies/congestion.).   6   omeprazole (PRILOSEC) 20 MG capsule Take 20 mg by mouth at bedtime.     predniSONE (DELTASONE) 10 MG tablet Take by mouth.     SPIRIVA RESPIMAT 2.5 MCG/ACT AERS SMARTSIG:2 Puff(s) Via Inhaler Daily     tamsulosin (FLOMAX) 0.4 MG CAPS Take 0.4 mg by mouth at bedtime.      apixaban (ELIQUIS) 5 MG TABS tablet TAKE 1 TABLET BY MOUTH TWICE A DAY 60 tablet 0   lisinopril (ZESTRIL) 5 MG tablet Take 5 mg by mouth at bedtime.     metoprolol succinate (TOPROL-XL) 25 MG 24 hr tablet Take 1 tablet (25 mg total) by mouth daily. 90 tablet 3   metoprolol tartrate (LOPRESSOR) 50 MG tablet Take 50 mg by mouth 2 (two) times daily.     azelastine (ASTELIN) 0.1 % nasal spray one spray by Both Nostrils route 2 (two) times daily. Use in each nostril as directed (Patient not taking: Reported on 02/25/2022)     diclofenac Sodium (VOLTAREN) 1 % GEL Apply 2 g topically 4 (four)  times daily. (Patient not taking: Reported on 02/25/2022) 50 g 1   donepezil (ARICEPT) 5 MG tablet Take 5 mg by mouth at bedtime.  (Patient not taking: Reported on 02/25/2022)  3   isosorbide dinitrate (ISORDIL) 30 MG tablet Take 15 mg by mouth at bedtime. (Patient not taking: Reported on 02/25/2022)     nitroGLYCERIN (NITROSTAT) 0.4 MG SL tablet Place 1 tablet (0.4 mg total) under the tongue every 5 (five) minutes as needed for chest pain. (Patient not taking: Reported on 02/25/2022) 25 tablet 2   Menthol-Methyl Salicylate (MUSCLE RUB) 10-15 % CREA Apply 1 application topically 4 (four) times daily as needed for muscle pain.  (Patient not taking: Reported on 02/25/2022)     No facility-administered medications prior to visit.     Allergies:   Plavix [clopidogrel], Contrast media [iodinated contrast media], and Doxycycline   Social History   Socioeconomic History   Marital status: Married    Spouse name: Not on file   Number of children: Not on file   Years of education: Not on file   Highest education level: Not on file  Occupational History   Not on file  Tobacco Use   Smoking status: Some Days    Packs/day: 0.25    Years: 65.00    Total pack years: 16.25    Types: Cigarettes    Last attempt to quit: 06/14/2017    Years since quitting: 4.7   Smokeless tobacco: Never   Tobacco comments:    smoking a 10 cigarettes a week   Vaping Use   Vaping Use: Some days  Substance and Sexual Activity   Alcohol use: No   Drug use: No   Sexual activity: Yes  Other Topics Concern  Not on file  Social History Narrative   Not on file   Social Determinants of Health   Financial Resource Strain: Not on file  Food Insecurity: Not on file  Transportation Needs: Not on file  Physical Activity: Not on file  Stress: Not on file  Social Connections: Not on file     Family History:  The patient's family history includes Cancer in his daughter, mother, and sister; Diabetes in his brother.    ROS General: Negative; No fevers, chills, or night sweats;  HEENT: Negative; No changes in vision or hearing, sinus congestion, difficulty swallowing Pulmonary: Negative; No cough, wheezing, shortness of breath, hemoptysis Cardiovascular: See HPI GI: Negative; No nausea, vomiting, diarrhea, or abdominal pain GU: Negative; No dysuria, hematuria, or difficulty voiding Musculoskeletal: Negative; no myalgias, joint pain, or weakness Hematologic/Oncology: Negative; no easy bruising, bleeding Endocrine: Negative; no heat/cold intolerance; no diabetes Neuro: Negative; no changes in balance, headaches Skin: Negative; No rashes or skin lesions Psychiatric: Negative; No behavioral problems, depression Sleep: Negative; No snoring, daytime sleepiness, hypersomnolence, bruxism, restless legs, hypnogognic hallucinations, no cataplexy Other comprehensive 14 point system review is negative.   PHYSICAL EXAM:   VS:  BP 100/60   Pulse (!) 59   Ht _0  (1.727 m)   Wt 167 lb 12.8 oz (76.1 kg)   SpO2 96%   BMI 25.51 kg/m     Repeat blood pressure by me was 110/64  Wt Readings from Last 3 Encounters:  02/25/22 167 lb 12.8 oz (76.1 kg)  01/28/22 160 lb (72.6 kg)  07/30/21 154 lb 3.2 oz (69.9 kg)    General: Alert, oriented, no distress.  Skin: normal turgor, no rashes, warm and dry HEENT: Normocephalic, atraumatic. Pupils equal round and reactive to light; sclera anicteric; extraocular muscles intact;  Nose without nasal septal hypertrophy Mouth/Parynx benign; Mallinpatti scale 3 Neck: No JVD, no carotid bruits; normal carotid upstroke Lungs: Decreased breath sounds without audible wheezing Chest wall: without tenderness to palpitation Heart: PMI not displaced, RRR, s1 s2 normal, 1/6 systolic murmur, no diastolic murmur, no rubs, gallops, thrills, or heaves Abdomen: soft, nontender; no hepatosplenomehaly, BS+; abdominal aorta nontender and not dilated by palpation. Back: no CVA  tenderness Pulses 2+ bilateral femoral artery bruits, left greater than right Musculoskeletal: full range of motion, normal strength, no joint deformities Extremities: no clubbing cyanosis or edema, Homan's sign negative  Neurologic: grossly nonfocal; Cranial nerves grossly wnl Psychologic: Normal mood and affect    Studies/Labs Reviewed:   February 25, 2022 ECG (independently read by me):  Sinus rhythm at 57, with PACs, RBBB, QTc 443 msec, LAD  I personally reviewed the ECG from November 13 which showed atrial flutter/fib with a ventricular rate at 120 bpm with right bundle branch block and left anterior hemiblock.  July 30, 2021 ECG (independently read by me): NSR at 64, RBBB, LAHB, sinus arryhthmia  February 27, 2019 ECG (independently read by me): Sinus rhythm with PACs at 65 bpm, right bundle branch block with repolarization changes  February 01, 2019 ECG (independently read by me): Sinus rhythm at 93 bpm with PACs, incomplete right bundle branch block  April 2019 ECG (independently read by me): Normal sinus rhythm at 85 bpm.  Right bundle branch block with repolarization changes.   April 2016 ECG (independently read by me): Sinus rhythm at 67 bpm with occasional PVCs with transient trigeminal rhythm; right bundle branch block.   October 2015 ECG (independently read by me):Normal sinus rhythm at 70 beats per minute.  Right bundle branch block with repolarization changes.  Normal intervals.   Prior ECG: Sinus rhythm with right bundle branch block at 67 beats per minute  Recent Labs:    Latest Ref Rng & Units 01/02/2022    2:15 PM 08/07/2021    9:36 AM 02/24/2019   11:33 AM  BMP  Glucose 70 - 99 mg/dL 83  92  87   BUN 8 - 23 mg/dL _0 Creatinine 0.61 - 1.24 mg/dL 1.68  1.54  1.46   BUN/Creat Ratio 10 - _1 Sodium 135 - 145 mmol/L 136  140  142   Potassium 3.5 - 5.1 mmol/L 3.7  4.4  4.5   Chloride 98 - 111 mmol/L 101  104  105   CO2 22 - 32 mmol/L _2 Calcium 8.9 - 10.3 mg/dL 9.0  9.0  8.9         Latest Ref Rng & Units 08/07/2021    9:36 AM 12/12/2018   11:35 AM 05/05/2017    9:07 AM  Hepatic Function  Total Protein 6.0 - 8.5 g/dL 7.0  6.8  6.4   Albumin 3.6 - 4.6 g/dL 4.3  4.2  3.7   AST 0 - 40 IU/L _3 ALT 0 - 44 IU/L _4 Alk Phosphatase 44 - 121 IU/L 88  85  97   Total Bilirubin 0.0 - 1.2 mg/dL 0.6  0.7  1.1        Latest Ref Rng & Units 01/02/2022    2:15 PM 08/07/2021    9:36 AM 02/24/2019   11:33 AM  CBC  WBC 4.0 - 10.5 K/uL 9.9  11.7  9.4   Hemoglobin 13.0 - 17.0 g/dL 12.8  14.7  13.6   Hematocrit 39.0 - 52.0 % 38.0  41.3  39.2   Platelets 150 - 400 K/uL 187  226  237    Lab Results  Component Value Date   MCV 99.2 01/02/2022   MCV 92 08/07/2021   MCV 93 02/24/2019   Lab Results  Component Value Date   TSH 1.620 08/07/2021   No results found for: "HGBA1C"   BNP No results found for: "BNP"  ProBNP No results found for: "PROBNP"   Lipid Panel     Component Value Date/Time   CHOL 161 08/07/2021 0936   TRIG 195 (H) 08/07/2021 0936   HDL 38 (L) 08/07/2021 0936   CHOLHDL 4.2 08/07/2021 0936   CHOLHDL 3.0 04/27/2017 0237   VLDL 15 04/27/2017 0237   LDLCALC 90 08/07/2021 0936   LABVLDL 33 08/07/2021 0936     RADIOLOGY: No results found.   Additional studies/ records that were reviewed today include:  Echocardiogram 04/27/2017 Study Conclusions   - Left ventricle: The cavity size was normal. Wall thickness was   normal. Systolic function was normal. The estimated ejection   fraction was in the range of 60% to 65%. Wall motion was normal;   there were no regional wall motion abnormalities. Doppler   parameters are consistent with abnormal left ventricular   relaxation (grade 1 diastolic dysfunction). - Aortic valve: There was no stenosis. - Aorta: Borderline dilated aortic root. Aortic root dimension: 38   mm (ED). - Mitral valve: Mildly calcified annulus. There was no  significant   regurgitation. Valve area by pressure half-time: 2.29 cm^2. - Right ventricle:  The cavity size was normal. Systolic function   was normal. - Tricuspid valve: Peak RV-RA gradient (S): 27 mm Hg. - Pulmonary arteries: PA peak pressure: 30 mm Hg (S). - Inferior vena cava: The vessel was normal in size. The   respirophasic diameter changes were in the normal range (>= 50%),   consistent with normal central venous pressure.   Impressions:   - Normal LV size with EF 60-65%. Normal RV size and systolic   function. No significant valvular abnormalities.   Cardiac cath 04/27/2017 Conclusion      Prox LAD lesion is 40% stenosed. Ost Cx to Prox Cx lesion is 25% stenosed. Mid LAD lesion is 50% stenosed. The left ventricular systolic function is normal. LV end diastolic pressure is normal. The left ventricular ejection fraction is 55-65% by visual estimate. There is no aortic valve stenosis. Mid Cx lesion is 95% stenosed. A drug-eluting stent was successfully placed using a STENT SYNERGY DES 3.5X16. Post intervention, there is a 0% residual stenosis.   Culprit circumflex lesion stented.  Double wire was needed to get balloon around proximal bend.  Guideliner was needed to get the stent around the proximal bend.  Would use EBU 3.5 Guide if cath needed in the future as EBU 3 did not provide great support.     COntinue DAPT for 1 year along with aggressive secondary prevention including smoking cessation.     I reviewed the records of [facility] with most recent evaluation on February 23, 2022.  ASSESSMENT:    1. Chronic anticoagulation   2. Paroxysmal atrial fibrillation (HCC)   3. Coronary artery disease involving native coronary artery of native heart with angina pectoris ([place])   4. PAD (peripheral artery disease) ([place])   5. Tobacco abuse   6. Bifascicular block (RBBB/LAHB)   7. Bilateral carotid artery stenosis   8. Hyperlipidemia with target LDL less than 70   9.  Stage 3b chronic kidney disease [facility])     PLAN:  Ralph Dawson is an 67 -year-old gentleman who has significant coronary as well as peripheral vascular disease and  has been so smoking for 70 years.  He has no intention to quit.  He has COPD and emphysema.  When I evaluated him in a telemedicine visit in August 2020 he had progressive claudication symptoms and subsequently was found to have high-grade 95% tandem mid left FSA stenoses with three-vessel runoff and underwent successful [facility] 1 directional atherectomy followed by a drug-coated balloon angioplasty by Dr. Gwenlyn Found on January 12, 2019.  During his postoperative course he was noted to have episodes of tachycardia suggesting AF with RVR.  A subsequent 10-day Zio patch monitor confirmed atrial fibrillation with several episodes of RVR with heart rates at 167 up to 178 bpm.  At that time, he was started on Eliquis and Brilinta was switched to Plavix.  He is no longer on clopidogrel and continues to be on aspirin 81 mg in addition to Eliquis 5 mg twice a day.  When I last saw him in April 2023, he was not having any anginal symptomatology  on his medical regimen consisting of isosorbide dinitrate 15 mg at bedtime, metoprolol succinate 25 mg daily in addition to his lisinopril 5 mg. His ECG showed sinus rhythm with bifascicular block with right bundle branch block and left anterior hemiblock. He continues to be on high potency statin therapy with atorvastatin 80 mg with target LDL less than 70.  He is followed by Dr. Anastasia Pall  for primary care.  He subsequently developed A-fib with RVR leading to hospitalization.  Since his last evaluation I reviewed his noninvasive imaging with echo revealing normal systolic and diastolic function with EF 60 to 65% and aortic valve sclerosis.  He had mild bilateral carotid plaque status post bilateral carotid endarterectomy and had normal vertebral and subclavian artery flow.  Lower extremity ABIs were unchanged and  remained in the normal range.  He was found to be in recurrent A-fib flutter with rapid ventricular rate at 122 days ago leading to his present reevaluation today.  Presently, with his creatinine of 1.8 I have recommended slight reduction of his lisinopril dose from 5 mg down to 2.5 mg.  I will try to slightly increase metoprolol to tartrate from 50 twice a day to 75 mg in the morning and 50 mg at night.  He will need follow-up chemistry evaluation and I will also check LFTs and TSH level in addition to proBNP.  If renal function remains elevated with his age of over 63 Eliquis dose may need to be reduced to 2.5 mg twice a day.  I again discussed the importance of complete smoking cessation.  He had recently undergone bronch[facility] at [facility].  His GERD is controlled with omeprazole.  He continues to be on atorvastatin 80 mg and Zetia 10 mg for hyperlipidemia.  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  I will see him in 3 to 4 weeks for follow-up evaluation.   Medication Adjustments/Labs and Tests Ordered: Current medicines are reviewed at length with the patient today.  Concerns regarding medicines are outlined above.  Medication changes, Labs and Tests ordered today are listed in the Patient Instructions below. Patient Instructions  Medication Instructions:  Decrease Lisinopril 2.5 mg daily  Decrease Eliquis 2.5 mg daily  Take Metoprolol Tartrate 1.5 tablet (75 mg) in the morning and 1 tablet (50 mg) in the evening.   *If you need a refill on your cardiac medications before your next appointment, please call your pharmacy*   Lab Work: BNP, CMET, CBC 2 weeks  If you have labs (blood work) drawn today and your tests are completely normal, you will receive your results only by: [place] (if you have MyChart) OR A paper copy in the mail If you have any lab test that is abnormal or we need to change your treatment, we will call you to review  the results.   Follow-Up: At [facility], you and your health needs are our priority.  As part of our continuing mission to provide you with exceptional heart care, we have created designated Provider Care Teams.  These Care Teams include your primary Cardiologist (physician) and Advanced Practice Providers (APPs -  Physician Assistants and Nurse Practitioners) who all work together to provide you with the care you need, when you need it.  We recommend signing up for the patient portal called "MyChart".  Sign up information is provided on this After Visit Summary.  MyChart is used to connect with patients for Virtual Visits (Telemedicine).  Patients are able to view lab/test results, encounter notes, upcoming appointments, etc.  Non-urgent messages can be sent to your provider as well.   To learn more about what you can do with MyChart, go to [email or web address].    Your next appointment:   3 week(s)  The format for your next appointment:   In Person  Provider:   Sande Rives, PA-C, Almyra Deforest, PA-C,  or Diona Browner, NP              Signed, Shelva Majestic, MD  03/03/2022 11:09 AM    [facility] [address], [place], [place], [place]  [address] Phone: [phone]

## 2022-02-25 NOTE — Patient Instructions (Addendum)
Medication Instructions:  Decrease Lisinopril 2.5 mg daily  Decrease Eliquis 2.5 mg daily  Take Metoprolol Tartrate 1.5 tablet (75 mg) in the morning and 1 tablet (50 mg) in the evening.   *If you need a refill on your cardiac medications before your next appointment, please call your pharmacy*   Lab Work: BNP, CMET, CBC 2 weeks  If you have labs (blood work) drawn today and your tests are completely normal, you will receive your results only by: MyChart Message (if you have MyChart) OR A paper copy in the mail If you have any lab test that is abnormal or we need to change your treatment, we will call you to review the results.   Follow-Up: At [facility], you and your health needs are our priority.  As part of our continuing mission to provide you with exceptional heart care, we have created designated Provider Care Teams.  These Care Teams include your primary Cardiologist (physician) and Advanced Practice Providers (APPs -  Physician Assistants and Nurse Practitioners) who all work together to provide you with the care you need, when you need it.  We recommend signing up for the patient portal called "MyChart".  Sign up information is provided on this After Visit Summary.  MyChart is used to connect with patients for Virtual Visits (Telemedicine).  Patients are able to view lab/test results, encounter notes, upcoming appointments, etc.  Non-urgent messages can be sent to your provider as well.   To learn more about what you can do with MyChart, go to [email or web address].    Your next appointment:   3 week(s)  The format for your next appointment:   In Person  Provider:   Marjie Skiff, PA-C, Azalee Course, PA-C, or Bernadene Person, NP

## 2022-03-03 ENCOUNTER — Encounter: Payer: Self-pay | Admitting: Cardiovascular Disease

## 2022-03-03 NOTE — Telephone Encounter (Signed)
Patient is on Metoprolol- see office notes.  11/15

## 2022-03-03 NOTE — Telephone Encounter (Signed)
Patient seen in office on 11/15

## 2022-03-11 DIAGNOSIS — J449 Chronic obstructive pulmonary disease, unspecified: Secondary | ICD-10-CM | POA: Diagnosis not present

## 2022-03-11 DIAGNOSIS — I10 Essential (primary) hypertension: Secondary | ICD-10-CM | POA: Diagnosis not present

## 2022-03-11 DIAGNOSIS — R911 Solitary pulmonary nodule: Secondary | ICD-10-CM | POA: Diagnosis not present

## 2022-03-11 DIAGNOSIS — J431 Panlobular emphysema: Secondary | ICD-10-CM | POA: Diagnosis not present

## 2022-03-12 DIAGNOSIS — I10 Essential (primary) hypertension: Secondary | ICD-10-CM | POA: Diagnosis not present

## 2022-03-12 DIAGNOSIS — R911 Solitary pulmonary nodule: Secondary | ICD-10-CM | POA: Diagnosis not present

## 2022-03-13 NOTE — Progress Notes (Unsigned)
Cardiology Office Note:    Date:  03/13/2022   ID:  Ralph Dawson, DOB [birth year], MRN [id number]  PCP:  Eartha Inch, MD  Cardiologist:  Nicki Guadalajara, MD  Electrophysiologist:  None   Referring MD: Eartha Inch, MD   Chief Complaint: follow-up of atrial fibrillation  History of Present Illness:    Ralph Dawson is a 81 y.o. 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 Tresa Endo and presents today for follow-up of atrial fibrillation.  Patient has a history of CAD. Patient was admitted in 04/2017 with NSTEMI. Echo showed LVEF of 60-65% with normal wall motion and grade 1 diastolic dysfunction. LHC showed 95% stenosis of mid LCX and otherwise only mild to moderate non-obstructive disease. Patient underwent successful PCI with DES to LCX lesion. Most recent Echo in 08/2021 showed LVEF of 60-65% with normal wall motion and diastolic parameters, normal RV,  and mild to moderate MR. He also has known PAD involving the lower extremities and carotid arteries. He underwent atherectomy and angioplasty of left SFA in 01/2019. Most recent lower extremity doppler in 08/2021 showed atherosclerosis in the common femoral, femoral, and popliteal arteries with 50-74% stenosis in the distal SFA but no significant changes compared to prior exam. ABIs were normal bilaterally. He underwent left CEA in 12/2007 and right CEA in 07/2009. Last carotid ultrasound in 08/2021 showed 1-39% stenosis of bilateral ICAs. More recently, he has had problems with atrial fibrillation/ flutter. He was first noted to have atrial fibrillation/flutter following  lower extremity intervention in 01/2019. This occurred in the setting of possible allergic reaction to contrast dye and being treated with Benadryl and Solu-Medrol. He converted back to normal sinus rhythm relatively quickly. Given this occurred in the setting of a possible allergic reaction, he was not started on anticoagulation and outpatient monitor was ordered to look for recurrence. Monitor showed intermittent episodes of atrial fibrillation with RVR as well as atrial flutter with 2:1 AV block. Therefore, he was started on Eliquis. He was seen in the ED in 12/2021 with recurrent atrial fibrillation with RVR. However, he converted back to sinus rhythm while in the ED and was discharged. He was maintaining sinus rhythm at his follow-up visit.  He underwent a bronchoscopy on 02/13/2022 for further evaluation of a pulmonary nodule. Biopsies performed at that time was non-diagnostic so he was referred to CT surgery. 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 Different options were discussed including surgical resection vs possible empiric radiation treatment vs ongoing surveillance. Patient did not want to undergo surgery and was very much again radiation treatment so elected to proceed with ongoing surveillance.   Patient was last seen by his PCP on 02/23/2022 for new onset shortness of breath and dizziness and was noted to be in atrial fibrillation with rates in the 120s. He has severe COPD and had been on Prednisone since 01/20/2022 but continued to report a cough.. Given rapid atrial fibrillation, close follow-up with Dr. Tresa Endo was arrived. He was see by Dr. Tresa Endo on 02/25/2022, he was back in normal sinus rhythm and denied any anginal symptoms.  Lopressor was increased from 75mg  twice daily and Lisinopril was decreased to 2.5mg  twice daily given renal function. CBC, BMET, and BNP were ordered but it does not look like these were drawn.  Eliquis was also  reduced to 2.5mg  twice daily given renal function and age. He was advised to follow-up in 3-4 weeks.   Patient presents today for follow-up.  Paroxysmal Atrial Fibrillation/ Flutter Initially diagnosed in 01/2019. Monitor at this time showed intermittent atrial fibrillation with RVR and intermittent atrial flutter with 2:1 AV block. He was noted to be back in atrial fibrillation with RVR on 02/23/2022 at PCPs office but was back in sinus rhythm at visit with Dr. 02/25/2022 two days later. Beta-blocker was increased.  - Maintaining sinus rhythm on exam.  - Continue Lopressor 75mg  twice daily. - Continue Eliquis 2.5mg  twice daily (reduced dose due to age and renal function). ***  CAD History of NSTEMI in 04/2017 s/p DES to mid LCX. - No chest pain.  - Continue Lopressor 75mg  twice daily and Isordil 15mg  daily. - Continue aspirin and high-sensitivity statin/ Zetia.  PAD S/p atherectomy and angioplasty of left SFA in 01/2019. Most recent lower extremity dopplers in 08/2021 showed atherosclerosis in the common femoral, femoral, and popliteal arteries with 50-74% stenosis in the distal SFA but no significant changes compared to prior exam. ABIs were normal bilaterally.  - Continue aspirin and statin/ Zetia.  Carotid Stenosis S/p left CEA in 12/2007 and right CEA in 07/2009. Last carotid ultrasound in 08/2021 showed 1-39% stenosis of bilateral ICAs. - Continue aspirin and statin/ Zetia.  Mild to Moderate Mitral Regurgitation Noted on most recent Echo in 08/2021. - Can continue routine surveillance.  ***  Hypertension BP *** - Continue current medications: Lisinopril 2.5mg  daily, Lopressor 75mg  twice daily, and Isordil 15mg  daily.   Hyperlipidemia Lipid panel in 07/2021: Total Cholesterol 161, Triglycerides 195, HDL 38, LDL 90. LDL goal <55 under new guidelines.  - Continue Lipitor 80mg  daily and Zetia 10mg  daily. ***  CKD Stage III ***   Past Medical History:  Diagnosis Date   Arthritis     "hands, feet" (04/27/2017)   Atrial fibrillation/flutter    a. during admission 01/2019 for PAD angiogram - isolated event after allergic reaction/steroids, monitor planned.   Bradycardia    a. brief episode of nocturnal bradycardia 01/2019 (?NSR with blocked PACs).   CAD (coronary artery disease)    Carotid stenosis 09/24/2011   R ICA patent w/ hx of endarterectomy, stenosis 1-39%;  L ICA patent w/ hx of endarterectomy; external carotids appear patent; see imaging tab for full report   Chest heaviness 02/25/2009   PVCs, atrial bigeminy   CKD (chronic kidney disease), stage III (HCC)    Claudication (HCC) 06/17/2011   LE doppler - bilateral ABIs normal values at rest; R CIA >50% diameter reduction L CIA 0-49% reduction; bilateral SFAs mild/mod mixed density plaque throughout suggesting 50-69% diameter reduction   Contrast media allergy    GERD (gastroesophageal reflux disease)    Hyperlipidemia    Hypertension 02/13/2010   echo - EF >55%; mild mitral annular calcification; mild aortic valve sclerosis   NSTEMI (non-ST elevated myocardial infarction) (HCC) 04/26/2017   04/29/2017 04/27/2017   PAD (peripheral artery disease) (HCC)    a. s/p LLE intervention on 01/2019.   RBBB (right bundle branch block) 02/13/2010   R/P MV - EF 67%; normal perfusion all regions; no significant wall abnormalties noted   Tobacco abuse     Past Surgical History:  Procedure Laterality Date   ABDOMINAL AORTOGRAM W/LOWER EXTREMITY Bilateral 01/12/2019   Procedure: ABDOMINAL AORTOGRAM W/LOWER EXTREMITY;  Surgeon: Runell Gess, MD;  Location: [facility];  Service: Cardiovascular;  Laterality: Bilateral;   APPENDECTOMY     ARTERIAL BYPASS SURGRY     pt unaware of this OR on 04/27/2017   BACK SURGERY     CAROTID ENDARTERECTOMY Right 07/15/2009   Right CEA   CAROTID ENDARTERECTOMY Left 12/13/2007   Left CEA   CORONARY ANGIOPLASTY WITH STENT PLACEMENT  04/27/2017   CORONARY STENT INTERVENTION N/A 04/27/2017    Procedure: CORONARY STENT INTERVENTION;  Surgeon: Corky Crafts, MD;  Location: [facility];  Service: Cardiovascular;  Laterality: N/A;   LAPAROSCOPIC CHOLECYSTECTOMY     LEFT HEART CATH AND CORONARY ANGIOGRAPHY N/A 04/27/2017   Procedure: LEFT HEART CATH AND CORONARY ANGIOGRAPHY;  Surgeon: Corky Crafts, MD;  Location: [facility] INVASIVE CV LAB;  Service: Cardiovascular;  Laterality: N/A;   LUMBAR DISC SURGERY     PERIPHERAL VASCULAR ATHERECTOMY Left 01/12/2019   Procedure: PERIPHERAL VASCULAR ATHERECTOMY;  Surgeon: Runell Gess, MD;  Location: [facility];  Service: Cardiovascular;  Laterality: Left;  SFA   TONSILLECTOMY      Current Medications: No outpatient medications have been marked as taking for the 03/18/22 encounter (Appointment) with Corrin Parker, PA-C.     Allergies:   Plavix [clopidogrel], Contrast media [iodinated contrast media], and Doxycycline   Social History   Socioeconomic History   Marital status: Married    Spouse name: Not on file   Number of children: Not on file   Years of education: Not on file   Highest education level: Not on file  Occupational History   Not on file  Tobacco Use   Smoking status: Some Days    Packs/day: 0.25    Years: 65.00    Total pack years: 16.25    Types: Cigarettes    Last attempt to quit: 06/14/2017    Years since quitting: 4.7   Smokeless tobacco: Never   Tobacco comments:    smoking a 10 cigarettes a week   Vaping Use   Vaping Use: Some days  Substance and Sexual Activity   Alcohol use: No   Drug use: No   Sexual activity: Yes  Other Topics Concern   Not on file  Social History Narrative   Not on file   Social Determinants of Health   Financial Resource Strain: Not on file  Food Insecurity: Not on file  Transportation Needs: Not on file  Physical Activity: Not on file  Stress: Not on file  Social Connections: Not on file     Family History: The patient's family history  includes Cancer in his daughter, mother, and sister; Diabetes in his brother.  ROS:   Please see the history of present illness.     EKGs/Labs/Other Studies Reviewed:    The following studies were reviewed:  Left Cardiac Catheterization 04/27/2017: Prox LAD lesion is 40% stenosed. Ost Cx to Prox Cx lesion is 25% stenosed. Mid LAD lesion is 50% stenosed. The left ventricular systolic function is normal. LV end diastolic pressure is normal. The left ventricular ejection fraction is 55-65% by visual estimate. There is no aortic valve stenosis. Mid Cx lesion is 95% stenosed. A drug-eluting stent was successfully placed using a STENT SYNERGY DES 3.5X16. Post intervention, there is a 0% residual stenosis.   Culprit circumflex lesion stented.  Double wire was needed  to get balloon around proximal bend.  Guideliner was needed to get the stent around the proximal bend.  Would use EBU 3.5 Guide if cath needed in the future as EBU 3 did not provide great support.     COntinue DAPT for 1 year along with aggressive secondary prevention including smoking cessation.   Diagnostic Dominance: Left  Intervention    _______________  Monitor 01/2019: The patient was monitored from October 14 to February 04, 2019. The predominant rhythm was sinus rhythm with an average rate of 72 bpm. The slowest heart rate was sinus bradycardia at 49 bpm at 7:30 AM on October 15. The fastest heart rate was atrial fibrillation at 167 bpm on October 21 at 11:35 AM. Throughout the recording, the patient had recurrent episodes of atrial fibrillation with RVR with his conduction abnormality as well as several episodes of atrial flutter at 2-1 block. Patient has an underlying right bundle branch block. There were no pauses. There were rare PVCs  _______________  Carotid Ultrasound 08/11/2021: Summary:  - Right Carotid: Velocities in the right ICA are consistent with a 1-39%  stenosis, s/p CEA. Non-hemodynamically  significant plaque <50% noted  in the CCA.  - Left Carotid: Velocities in the left ICA are consistent with a 1-39%  stenosis, s/p CEA. Non-hemodynamically significant plaque <50% noted  in the CCA.  - Vertebrals:  Bilateral vertebral arteries demonstrate antegrade flow.  - Subclavians: Normal flow hemodynamics were seen in bilateral subclavian arteries.   _______________  Echocardiogram 08/13/2021: Impressions: 1. Left ventricular ejection fraction, by estimation, is 60 to 65%. The  left ventricle has normal function. The left ventricle has no regional  wall motion abnormalities. Left ventricular diastolic parameters were  normal.   2. Right ventricular systolic function is normal. The right ventricular  size is normal. There is normal pulmonary artery systolic pressure.   3. The mitral valve is abnormal. Mild to moderate mitral valve  regurgitation. No evidence of mitral stenosis.   4. The aortic valve was not well visualized. There is moderate  calcification of the aortic valve. There is moderate thickening of the  aortic valve. Aortic valve regurgitation is not visualized. Aortic valve  sclerosis/calcification is present, without  any evidence of aortic stenosis.   5. The inferior vena cava is dilated in size with >50% respiratory  variability, suggesting right atrial pressure of 8 mmHg.  _______________  Lower Extremity Arterial Ultrasound/ ABIs 08/28/2021: Summary:  Left: Atherosclerosis in the common femoral, femoral and popliteal  arteries. 50-74% stenosis in the distal SFA, s/p angioplasty. No signficant change  compared to previous exam.   See table(s) above for measurements and observations.  See ABI report.   ABI/ TBIs Summary: Right: Resting right ankle-brachial index is within normal range. No  evidence of significant right lower extremity arterial disease. The right  toe-brachial index is abnormal.   Left: Resting left ankle-brachial index is within normal range.  No  evidence of significant left lower extremity arterial disease. The left  toe-brachial index is abnormal.     EKG:  EKG *** ordered today. EKG personally reviewed and demonstrates ***.  Recent Labs: 08/07/2021: ALT 18; TSH 1.620 01/02/2022: BUN 19; Creatinine, Ser 1.68; Hemoglobin 12.8; Platelets 187; Potassium 3.7; Sodium 136  Recent Lipid Panel    Component Value Date/Time   CHOL 161 08/07/2021 0936   TRIG 195 (H) 08/07/2021 0936   HDL 38 (L) 08/07/2021 0936   CHOLHDL 4.2 08/07/2021 0936   CHOLHDL 3.0 04/27/2017 0237  VLDL 15 04/27/2017 0237   LDLCALC 90 08/07/2021 0936    Physical Exam:    Vital Signs: There were no vitals taken for this visit.    Wt Readings from Last 3 Encounters:  02/25/22 167 lb 12.8 oz (76.1 kg)  01/28/22 160 lb (72.6 kg)  07/30/21 154 lb 3.2 oz (69.9 kg)     General: 81 y.o. male in no acute distress. HEENT: Normocephalic and atraumatic. Sclera clear. EOMs intact. Neck: Supple. No carotid bruits. No JVD. Heart: *** RRR. Distinct S1 and S2. No murmurs, gallops, or rubs. Radial and distal pedal pulses 2+ and equal bilaterally. Lungs: No increased work of breathing. Clear to ausculation bilaterally. No wheezes, rhonchi, or rales.  Abdomen: Soft, non-distended, and non-tender to palpation. Bowel sounds present in all 4 quadrants.  MSK: Normal strength and tone for age. *** Extremities: No lower extremity edema.    Skin: Warm and dry. Neuro: Alert and oriented x3. No focal deficits. Psych: Normal affect. Responds appropriately.   Assessment:    No diagnosis found.  Plan:     Disposition: Follow up in ***   Medication Adjustments/Labs and Tests Ordered: Current medicines are reviewed at length with the patient today.  Concerns regarding medicines are outlined above.  No orders of the defined types were placed in this encounter.  No orders of the defined types were placed in this encounter.   There are no Patient Instructions on file  for this visit.   Signed, Corrin Parker, PA-C  03/13/2022 12:07 PM    [facility]

## 2022-03-16 NOTE — Progress Notes (Unsigned)
Cardiology Clinic Note   Patient Name: Ralph Dawson Date of Encounter: 03/18/2022  Primary Care Provider:  Eartha Inch, MD Primary Cardiologist:  Nicki Guadalajara, MD  Patient Profile    Ralph Dawson is a 81 y.o. 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   Past Medical History    Past Medical History:  Diagnosis Date   Arthritis    "hands, feet" (04/27/2017)   Atrial fibrillation/flutter    a. during admission 01/2019 for PAD angiogram - isolated event after allergic reaction/steroids, monitor planned.   Bradycardia    a. brief episode of nocturnal bradycardia 01/2019 (?NSR with blocked PACs).   CAD (coronary artery disease)    Carotid stenosis 09/24/2011   R ICA patent w/ hx of endarterectomy, stenosis 1-39%;  L ICA patent w/ hx of endarterectomy; external carotids appear patent; see imaging tab for full report   Chest heaviness 02/25/2009   PVCs, atrial bigeminy   CKD (chronic kidney disease), stage III (HCC)    Claudication (HCC) 06/17/2011   LE doppler - bilateral ABIs normal values at rest; R CIA >50% diameter reduction L CIA 0-49% reduction; bilateral SFAs mild/mod mixed density plaque throughout suggesting 50-69% diameter reduction   Contrast media allergy    GERD (gastroesophageal reflux disease)    Hyperlipidemia    Hypertension 02/13/2010   echo - EF >55%; mild mitral annular calcification; mild aortic valve sclerosis   NSTEMI (non-ST elevated myocardial infarction) (HCC) 04/26/2017   Ralph Dawson 04/27/2017   PAD (peripheral artery disease) (HCC)    a. s/p LLE intervention on 01/2019.   RBBB (right bundle branch block) 02/13/2010   R/P MV - EF 67%;  normal perfusion all regions; no significant wall abnormalties noted   Tobacco abuse    Past Surgical History:  Procedure Laterality Date   ABDOMINAL AORTOGRAM W/LOWER EXTREMITY Bilateral 01/12/2019   Procedure: ABDOMINAL AORTOGRAM W/LOWER EXTREMITY;  Surgeon: Runell Gess, MD;  Location: [facility];  Service: Cardiovascular;  Laterality: Bilateral;   APPENDECTOMY     ARTERIAL BYPASS SURGRY     pt unaware of this OR on 04/27/2017   BACK SURGERY     CAROTID ENDARTERECTOMY Right 07/15/2009   Right CEA   CAROTID ENDARTERECTOMY Left 12/13/2007   Left CEA   CORONARY ANGIOPLASTY WITH STENT PLACEMENT  04/27/2017   CORONARY STENT INTERVENTION N/A 04/27/2017   Procedure: CORONARY STENT INTERVENTION;  Surgeon: Corky Crafts, MD;  Location: [facility];  Service: Cardiovascular;  Laterality: N/A;   LAPAROSCOPIC CHOLECYSTECTOMY     LEFT HEART CATH AND CORONARY ANGIOGRAPHY N/A 04/27/2017   Procedure: LEFT HEART CATH AND CORONARY ANGIOGRAPHY;  Surgeon: Corky Crafts, MD;  Location: [facility] CV LAB;  Service: Cardiovascular;  Laterality: N/A;   LUMBAR DISC SURGERY     PERIPHERAL VASCULAR ATHERECTOMY Left 01/12/2019   Procedure: PERIPHERAL VASCULAR ATHERECTOMY;  Surgeon: Runell Gess, MD;  Location: [facility];  Service: Cardiovascular;  Laterality: Left;  SFA   TONSILLECTOMY      Allergies  Allergies  Allergen Reactions   Plavix [Clopidogrel]     Causes severe pain in hands with dizziness.    Contrast Media [Iodinated Contrast Media] Itching  Caused itching. Possibly caused SVT vs afib post-op   Doxycycline Rash    History of Present Illness    Ralph Dawson has a past medical history of: PAF/Aflutter. Post lower extremity vascular intervention October 2020 afib/aflutter thought to be related to allergic reaction/steroids. Event monitor 01/25/2019: Predominant rhythm was sinus rhythm avg HR 72 bpm. Recurrent episodes of afib with RVR with  underlying RBBB and several episodes of aflutter 2:1 block. No pauses. Rare PVCs.  Eliquis dose reduced to 2.5 mg twice a day November 2023.  CAD. LHC 04/27/2017: Proximal LAD 40%, ostial Cx to proximal Cx 25%, mid LAD 50%, mid Cx 95% PCI DES 3.5x16.  Echo 04/27/2017: EF 60-65%. Grade I DD. Borderline aortic root dilatation 38 mm.  Was previously on DAPT with aspirin and Brilinta. Brilinta stopped secondary to Eliquis for PAF.  PAD/claudication. Arthrectomy/PTA left SFA 01/12/2019. Left lower extremity arterial duplex US 08/28/2021: Atherosclerosis in the common femoral, femoral and popliteal arteries. 50-75% stenosis distal SFA s/p angioplasty. No change from previous. ABIs unchanged from previous.  Carotid artery disease.  S/p left carotid endarterectomy September 2009 and right carotid endarterectomy April 2011.  Carotid duplex US 08/11/2021: Right ICA 1-39%, s/p CEA, <50% plaque CCA. Left ICA 1-39%, s/p CEA, <50% plaque CCA.  Hyperlipidemia.  Lipid panel 08/07/2021: LDL 90, HDL 38, TG 195, total 161. CKD stage IIIb. Tobacco abuse. COPD.   Ralph Dawson is a long time patient of cardiology. Patient was last seen in the office by Dr. Tresa Endo on 02/25/2022 after being in afib/aflutter at [facility]. His BP had been stable but low. He continues to smoke. His Lisinopril and Eliquis were reduced secondary to kidney function. His Metoprolol was increased slightly.   Today, patient is feeling much improved. He denies shortness of breath or chest pain. He has brief palpitations throughout the day but none in the last several days. He states he notices it the most when he is laying down at night, particularly when he is on his left side. When he is on his right side the palpations will stop and he is able to sleep through the night. His BP is elevated today at 148/64 at intake and 136/80 on recheck. He reports he checks his BP several times a day but not every day and feels it is up and down. It may be 140/80  and then 120/50. He states this is because of all the recent medication changes and is adamant about not wanting to make any other changes since he has been feeling good. He continues to smoke with a pack lasting him around 2.5 days. He is happy with this reduction and is not interested in quitting entirely. He denies blood in urine or stool or any other bleeding issues.    Home Medications    Current Meds  Medication Sig   acetaminophen (TYLENOL) 650 MG CR tablet Take 1 tablet (650 mg total) by mouth every 8 (eight) hours as needed for pain.   albuterol (PROVENTIL HFA;VENTOLIN HFA) 108 (90 Base) MCG/ACT inhaler Inhale 1-2 puffs into the lungs every 6 (six) hours as needed (wheezing/shortness of breath.).    apixaban (ELIQUIS) 2.5 MG TABS tablet Take 1 tablet (2.5 mg total) by mouth 2 (two) times daily.   aspirin EC 81 MG tablet Take 81 mg by mouth daily. Swallow whole.   atorvastatin (LIPITOR) 80 MG tablet TAKE 1 TABLET BY MOUTH EVERY DAY AT 6PM (Patient taking differently: Take 80 mg by mouth at bedtime.)  Cholecalciferol 125 MCG (5000 UT) capsule Take 1 capsule by mouth every morning.   diclofenac Sodium (VOLTAREN) 1 % GEL Apply 2 g topically 4 (four) times daily.   donepezil (ARICEPT) 5 MG tablet Take 5 mg by mouth at bedtime.   DULoxetine (CYMBALTA) 60 MG capsule Take 60 mg by mouth at bedtime.   ezetimibe (ZETIA) 10 MG tablet Take 1 tablet (10 mg total) by mouth daily.   finasteride (PROSCAR) 5 MG tablet Take 5 mg by mouth daily.   isosorbide dinitrate (ISORDIL) 30 MG tablet Take 15 mg by mouth at bedtime.   lisinopril (ZESTRIL) 5 MG tablet Take 0.5 tablets (2.5 mg total) by mouth at bedtime.   metoprolol tartrate (LOPRESSOR) 50 MG tablet Take 1.5 tablet (75 mg) in the AM and 1 tablet (50 mg) in the PM   nitroGLYCERIN (NITROSTAT) 0.4 MG SL tablet Place 1 tablet (0.4 mg total) under the tongue every 5 (five) minutes as needed for chest pain.   omeprazole (PRILOSEC) 20 MG capsule Take 20  mg by mouth at bedtime.   predniSONE (DELTASONE) 10 MG tablet Take by mouth.   SPIRIVA RESPIMAT 2.5 MCG/ACT AERS SMARTSIG:2 Puff(s) Via Inhaler Daily   tamsulosin (FLOMAX) 0.4 MG CAPS Take 0.4 mg by mouth at bedtime.     Family History    Family History  Problem Relation Age of Onset   Cancer Mother        stomach   Cancer Sister    Diabetes Brother    Cancer Daughter    He indicated that his mother is deceased. He indicated that his father is deceased. He indicated that his sister is alive. He indicated that his brother is alive. He indicated that his maternal grandmother is deceased. He indicated that his maternal grandfather is deceased. He indicated that his paternal grandmother is deceased. He indicated that his paternal grandfather is deceased. He indicated that the status of his daughter is unknown.   Social History    Social History   Socioeconomic History   Marital status: Married    Spouse name: Not on file   Number of children: Not on file   Years of education: Not on file   Highest education level: Not on file  Occupational History   Not on file  Tobacco Use   Smoking status: Some Days    Packs/day: 0.25    Years: 65.00    Total pack years: 16.25    Types: Cigarettes    Last attempt to quit: 06/14/2017    Years since quitting: 4.7   Smokeless tobacco: Never   Tobacco comments:    smoking a 10 cigarettes a week   Vaping Use   Vaping Use: Some days  Substance and Sexual Activity   Alcohol use: No   Drug use: No   Sexual activity: Yes  Other Topics Concern   Not on file  Social History Narrative   Not on file   Social Determinants of Health   Financial Resource Strain: Not on file  Food Insecurity: Not on file  Transportation Needs: Not on file  Physical Activity: Not on file  Stress: Not on file  Social Connections: Not on file  Intimate Partner Violence: Not on file     Review of Systems    General:  No chills, fever, night sweats or weight  changes.  Cardiovascular:  No chest pain, dyspnea on exertion, edema, orthopnea,  paroxysmal nocturnal dyspnea. Positive for brief palpitations. None in the last several  days.  Dermatological: No rash, lesions/masses Respiratory: No cough, dyspnea Urologic: No hematuria, dysuria Abdominal:   No nausea, vomiting, diarrhea, bright red blood per rectum, melena, or hematemesis Neurologic:  No visual changes, weakness, changes in mental status. All other systems reviewed and are otherwise negative except as noted above.  Physical Exam    VS:  BP (!) 148/64 (BP Location: Left Arm, Patient Position: Sitting, Cuff Size: Normal)   Pulse 62   Ht 5\' 8"  (1.727 m)   Wt 166 lb 12.8 oz (75.7 kg)   SpO2 97%   BMI 25.36 kg/m  , BMI Body mass index is 25.36 kg/m. GEN:  Well nourished, well developed, in no acute distress. HEENT: Normal. Neck: Supple, no JVD, carotid bruits, or masses. Cardiac: RRR, no murmurs, rubs, or gallops. No clubbing, cyanosis, edema.  Radials/DP/PT 2+ and equal bilaterally.  Respiratory:  Respirations regular and unlabored, diminished breath sounds throughout without wheezing, rhonchi or rales. GI: Soft, nontender, nondistended. MS: No deformity or atrophy. Skin: Warm and dry, no rash. Neuro: Strength and sensation are intact. Psych: Normal affect.  Accessory Clinical Findings   Recent Labs: 08/07/2021: ALT 18; TSH 1.620 01/02/2022: BUN 19; Creatinine, Ser 1.68; Hemoglobin 12.8; Platelets 187; Potassium 3.7; Sodium 136   Recent Lipid Panel    Component Value Date/Time   CHOL 161 08/07/2021 0936   TRIG 195 (H) 08/07/2021 0936   HDL 38 (L) 08/07/2021 0936   CHOLHDL 4.2 08/07/2021 0936   CHOLHDL 3.0 04/27/2017 0237   VLDL 15 04/27/2017 0237   LDLCALC 90 08/07/2021 0936       ECG not indicated today.    CHA2DS2-VASc Score = 4   This indicates a 4.8% annual risk of stroke. The patient's score is based upon: CHF History: 0 HTN History: 1 Diabetes History:  0 Stroke History: 0 Vascular Disease History: 1 Age Score: 2 Gender Score: 0 Continue Eliquis.       Assessment & Plan   PAF/Aflutter. Event monitor October 2020 recurrent episodes of afib with RVR, underlying RBBB, several episodes of aflutter 2:1 block. On Eliquis, recently reduced to 2.5 mg. He has been in NSR at his last two visits. Patient reports no palpitations in the last several days. Prior to that he experiences brief palpitations throughout the day. He notices it mostly at night, particularly when he lays on his left side. Discussed checking his kidney function today. It looks like a BMP was ordered at his last visit on 02/25/2022 but was not collected. If his creatinine has normalized may increase his eliquis to 5 mg. He is in agreement with this. Continue Eliquis, Metoprolol 75 mg in the AM and 50 mg in PM.  CAD. S/p PCI with DES to LAD January 2019. Patient denies chest pain or shortness of breath.  Continue aspirin, Metoprolol tartrate, isosorbide, atorvastatin and Zetia, and PRN SL NTG.  Carotid artery disease. S/p carotid endarterectomy left September 2009 and right April 2011. US May 2023 bilateral ICA 1-39% and <50% plaque CCA. Continue atorvastatin and Zetia.  PAD. S/p arthrectomy/PTA left SFA October 2020. Left lower extremity US/ABIs May 2023 unchanged from previous. He has no complaints of claudication today. Continue aspirin, atorvastatin and Zetia.  Hyperlipidemia. LDL 08/07/2021 90, not at goal. Continue atorvastatin and Zetia.  Tobacco abuse. Patient continues to smoke. A pack will last him 2.5 days. He is satisfied with that at this point and is not interested in quitting.     Disposition: BMP today. Follow-up in four months  or sooner as needed.    Etta Grandchild. Delmon Andrada, NP-C     03/18/2022, 3:02 PM [facility] [address] Office [phone] Fax [phone]   I spent 10 minutes examining this patient, reviewing  medications, and using patient centered shared decision making involving her cardiac care.  Prior to her visit I spent greater than 20 minutes reviewing her past medical history,  medications, and prior cardiac tests.

## 2022-03-18 ENCOUNTER — Encounter: Payer: Self-pay | Admitting: Student

## 2022-03-18 ENCOUNTER — Ambulatory Visit: Payer: BC Managed Care – PPO | Attending: Student | Admitting: Student

## 2022-03-18 VITALS — BP 136/80 | HR 62 | Ht 68.0 in | Wt 166.8 lb

## 2022-03-18 DIAGNOSIS — I480 Paroxysmal atrial fibrillation: Secondary | ICD-10-CM

## 2022-03-18 DIAGNOSIS — Z720 Tobacco use: Secondary | ICD-10-CM

## 2022-03-18 DIAGNOSIS — I10 Essential (primary) hypertension: Secondary | ICD-10-CM | POA: Diagnosis not present

## 2022-03-18 DIAGNOSIS — E785 Hyperlipidemia, unspecified: Secondary | ICD-10-CM | POA: Diagnosis not present

## 2022-03-18 DIAGNOSIS — I6523 Occlusion and stenosis of bilateral carotid arteries: Secondary | ICD-10-CM

## 2022-03-18 DIAGNOSIS — I739 Peripheral vascular disease, unspecified: Secondary | ICD-10-CM

## 2022-03-18 NOTE — Patient Instructions (Signed)
Medication Instructions:  No changes *If you need a refill on your cardiac medications before your next appointment, please call your pharmacy*   Lab Work: BMET If you have labs (blood work) drawn today and your tests are completely normal, you will receive your results only by: MyChart Message (if you have MyChart) OR A paper copy in the mail If you have any lab test that is abnormal or we need to change your treatment, we will call you to review the results.   Testing/Procedures: No Testing   Follow-Up: At [facility], you and your health needs are our priority.  As part of our continuing mission to provide you with exceptional heart care, we have created designated Provider Care Teams.  These Care Teams include your primary Cardiologist (physician) and Advanced Practice Providers (APPs -  Physician Assistants and Nurse Practitioners) who all work together to provide you with the care you need, when you need it.  We recommend signing up for the patient portal called "MyChart".  Sign up information is provided on this After Visit Summary.  MyChart is used to connect with patients for Virtual Visits (Telemedicine).  Patients are able to view lab/test results, encounter notes, upcoming appointments, etc.  Non-urgent messages can be sent to your provider as well.   To learn more about what you can do with MyChart, go to [email or web address].    Your next appointment:   4-6 month(s)  The format for your next appointment:   In Person  Provider:   Nicki Guadalajara, MD     Important Information About Sugar

## 2022-03-19 LAB — BASIC METABOLIC PANEL
BUN/Creatinine Ratio: 12 (ref 10–24)
BUN: 18 mg/dL (ref 8–27)
CO2: 24 mmol/L (ref 20–29)
Calcium: 8.9 mg/dL (ref 8.6–10.2)
Chloride: 102 mmol/L (ref 96–106)
Creatinine, Ser: 1.48 mg/dL — ABNORMAL HIGH (ref 0.76–1.27)
Glucose: 97 mg/dL (ref 70–99)
Potassium: 4 mmol/L (ref 3.5–5.2)
Sodium: 141 mmol/L (ref 134–144)
eGFR: 47 mL/min/{1.73_m2} — ABNORMAL LOW (ref >59)

## 2022-03-20 ENCOUNTER — Telehealth: Payer: Self-pay

## 2022-03-20 NOTE — Telephone Encounter (Addendum)
Called patient regarding results. Left message for patient to call office.----- Message from Ralph Levering, NP sent at 03/19/2022  7:44 AM EST ----- Please let the patient know his kidney function is improved but still just below the cut off to increase his dose of Eliquis. He will continue Eliquis 2.5 mg twice a day.   Thank you!

## 2022-03-31 ENCOUNTER — Other Ambulatory Visit: Payer: Self-pay | Admitting: Cardiovascular Disease

## 2022-04-22 DIAGNOSIS — I10 Essential (primary) hypertension: Secondary | ICD-10-CM | POA: Diagnosis not present

## 2022-04-22 DIAGNOSIS — R42 Dizziness and giddiness: Secondary | ICD-10-CM | POA: Diagnosis not present

## 2022-04-22 DIAGNOSIS — I480 Paroxysmal atrial fibrillation: Secondary | ICD-10-CM | POA: Diagnosis not present

## 2022-06-10 DIAGNOSIS — J449 Chronic obstructive pulmonary disease, unspecified: Secondary | ICD-10-CM | POA: Diagnosis not present

## 2022-06-10 DIAGNOSIS — R911 Solitary pulmonary nodule: Secondary | ICD-10-CM | POA: Diagnosis not present

## 2022-06-10 DIAGNOSIS — I10 Essential (primary) hypertension: Secondary | ICD-10-CM | POA: Diagnosis not present

## 2022-06-15 DIAGNOSIS — I480 Paroxysmal atrial fibrillation: Secondary | ICD-10-CM | POA: Diagnosis not present

## 2022-06-15 DIAGNOSIS — I10 Essential (primary) hypertension: Secondary | ICD-10-CM | POA: Diagnosis not present

## 2022-06-15 DIAGNOSIS — M5136 Other intervertebral disc degeneration, lumbar region: Secondary | ICD-10-CM | POA: Diagnosis not present

## 2022-06-15 DIAGNOSIS — K219 Gastro-esophageal reflux disease without esophagitis: Secondary | ICD-10-CM | POA: Diagnosis not present

## 2022-06-18 DIAGNOSIS — H90A31 Mixed conductive and sensorineural hearing loss, unilateral, right ear with restricted hearing on the contralateral side: Secondary | ICD-10-CM | POA: Diagnosis not present

## 2022-06-18 DIAGNOSIS — H90A22 Sensorineural hearing loss, unilateral, left ear, with restricted hearing on the contralateral side: Secondary | ICD-10-CM | POA: Diagnosis not present

## 2022-06-18 DIAGNOSIS — R1310 Dysphagia, unspecified: Secondary | ICD-10-CM | POA: Diagnosis not present

## 2022-06-18 DIAGNOSIS — J300 Vasomotor rhinitis: Secondary | ICD-10-CM | POA: Diagnosis not present

## 2022-06-18 DIAGNOSIS — K219 Gastro-esophageal reflux disease without esophagitis: Secondary | ICD-10-CM | POA: Diagnosis not present

## 2022-06-19 DIAGNOSIS — I739 Peripheral vascular disease, unspecified: Secondary | ICD-10-CM | POA: Diagnosis not present

## 2022-06-19 DIAGNOSIS — I480 Paroxysmal atrial fibrillation: Secondary | ICD-10-CM | POA: Diagnosis not present

## 2022-06-19 DIAGNOSIS — I10 Essential (primary) hypertension: Secondary | ICD-10-CM | POA: Diagnosis not present

## 2022-06-19 DIAGNOSIS — R600 Localized edema: Secondary | ICD-10-CM | POA: Diagnosis not present

## 2022-06-30 DIAGNOSIS — I10 Essential (primary) hypertension: Secondary | ICD-10-CM | POA: Diagnosis not present

## 2022-06-30 DIAGNOSIS — N401 Enlarged prostate with lower urinary tract symptoms: Secondary | ICD-10-CM | POA: Diagnosis not present

## 2022-06-30 DIAGNOSIS — N138 Other obstructive and reflux uropathy: Secondary | ICD-10-CM | POA: Diagnosis not present

## 2022-07-20 ENCOUNTER — Ambulatory Visit: Payer: BC Managed Care – PPO | Admitting: Cardiovascular Disease

## 2022-07-31 NOTE — Progress Notes (Signed)
Cardiology Clinic Note   Patient Name: [name] Date of Encounter: 08/03/2022  Primary Care Provider:  Eartha Inch, MD Primary Cardiologist:  Nicki Guadalajara, MD  Patient Profile    Ralph Dawson 82 year old male presents the clinic today for follow-up evaluation of his coronary artery disease and paroxysmal atrial fibrillation.  Past Medical History    Past Medical History:  Diagnosis Date   Arthritis    "hands, feet" (04/27/2017)   Atrial fibrillation/flutter    a. during admission 01/2019 for PAD angiogram - isolated event after allergic reaction/steroids, monitor planned.   Bradycardia    a. brief episode of nocturnal bradycardia 01/2019 (?NSR with blocked PACs).   CAD (coronary artery disease)    Carotid stenosis 09/24/2011   R ICA patent w/ hx of endarterectomy, stenosis 1-39%;  L ICA patent w/ hx of endarterectomy; external carotids appear patent; see imaging tab for full report   Chest heaviness 02/25/2009   PVCs, atrial bigeminy   CKD (chronic kidney disease), stage III    Claudication 06/17/2011   LE doppler - bilateral ABIs normal values at rest; R CIA >50% diameter reduction L CIA 0-49% reduction; bilateral SFAs mild/mod mixed density plaque throughout suggesting 50-69% diameter reduction   Contrast media allergy    GERD (gastroesophageal reflux disease)    Hyperlipidemia    Hypertension 02/13/2010   echo - EF >55%; mild mitral annular calcification; mild aortic valve sclerosis   NSTEMI (non-ST elevated myocardial infarction) 04/26/2017   [name] 04/27/2017   PAD (peripheral artery disease)    a. s/p LLE intervention on 01/2019.   RBBB (right bundle branch block) 02/13/2010   R/P MV - EF 67%; normal perfusion all regions; no significant wall abnormalties noted   Tobacco abuse    Past Surgical History:  Procedure Laterality Date   ABDOMINAL AORTOGRAM W/LOWER EXTREMITY Bilateral 01/12/2019   Procedure: ABDOMINAL AORTOGRAM W/LOWER EXTREMITY;  Surgeon:  Runell Gess, MD;  Location: [facility];  Service: Cardiovascular;  Laterality: Bilateral;   APPENDECTOMY     ARTERIAL BYPASS SURGRY     pt unaware of this OR on 04/27/2017   BACK SURGERY     CAROTID ENDARTERECTOMY Right 07/15/2009   Right CEA   CAROTID ENDARTERECTOMY Left 12/13/2007   Left CEA   CORONARY ANGIOPLASTY WITH STENT PLACEMENT  04/27/2017   CORONARY STENT INTERVENTION N/A 04/27/2017   Procedure: CORONARY STENT INTERVENTION;  Surgeon: Corky Crafts, MD;  Location: [facility];  Service: Cardiovascular;  Laterality: N/A;   LAPAROSCOPIC CHOLECYSTECTOMY     LEFT HEART CATH AND CORONARY ANGIOGRAPHY N/A 04/27/2017   Procedure: LEFT HEART CATH AND CORONARY ANGIOGRAPHY;  Surgeon: Corky Crafts, MD;  Location: [facility] INVASIVE CV LAB;  Service: Cardiovascular;  Laterality: N/A;   LUMBAR DISC SURGERY     PERIPHERAL VASCULAR ATHERECTOMY Left 01/12/2019   Procedure: PERIPHERAL VASCULAR ATHERECTOMY;  Surgeon: Runell Gess, MD;  Location: [facility];  Service: Cardiovascular;  Laterality: Left;  SFA   TONSILLECTOMY      Allergies  Allergies  Allergen Reactions   Plavix [Clopidogrel]     Causes severe pain in hands with dizziness.    Contrast Media [Iodinated Contrast Media] Itching    Caused itching. Possibly caused SVT vs afib post-op   Doxycycline Rash    History of Present Illness    DUSHAWN Dawson has a PMH of paroxysmal atrial fibrillation, carotid stenosis, HTN, HLD, tobacco abuse, and PAD.  He underwent cardiac catheterization with PCI and DES to his mid circumflex in 2019.  He underwent left carotid endarterectomy in 2009 and right carotid endarterectomy in 2011.  His PMH also includes intermittent bilateral lower extremity claudication, and COPD.  He followed up with Dr. Tresa Endo 02/25/2022.  He was noted to have atrial fibrillation/flutter at [facility].  He was noted to have stable blood pressure however it was low.  He continued to  smoke.  His lisinopril and apixaban were reduced secondary to kidney function, age.  His metoprolol was increased.  He was seen in follow-up by Carlos Levering NP-C on 03/18/2022.  He denied shortness of breath and chest discomfort.  He did note brief episodes of palpitations throughout the day but had not noticed them in the last several days prior to follow-up.  He indicated that the episodes would mainly happen at night when he would lay down.  His blood pressure was noted to be 148/64 and on recheck was 136/80.  He had been checking his blood pressure several times per day but was not checking it daily.  He reported labile blood pressures.  He felt that his fluctuation in blood pressure was due to his recent medication changes.  He did not want to change medication and reported that he was feeling good.  He continued to smoke a pack every 2-1/2 days.  He was not interested in quitting smoking.  He denied bleeding issues.  He presents to the clinic today for follow-up evaluation and states he wants supplemental oxygen.  He reports that he is not able to do his normal daily activities without becoming increasingly short of breath.  He is currently not using home oxygen.  His oxygen saturation is noted to be 97% on room air.  He continues to smoke.  We reviewed his albuterol and steroid inhalers.  He reports that he is using them regularly without improvement in his symptoms.  His blood pressure today  is 126/68.  He reports that he has gained around 20 pounds.  I encouraged him to stop smoking.  I educated him on pursed lip breathing and incentive spirometry.  I will order a CBC and echocardiogram.  Will plan follow-up been 2 to 3 months.  Today he denies chest pain, lower extremity edema,  palpitations, melena, hematuria, hemoptysis, diaphoresis, weakness, presyncope, syncope, orthopnea, and PND.     Home Medications    Prior to Admission medications   Medication Sig Start Date End Date Taking?  Authorizing Provider  acetaminophen (TYLENOL) 650 MG CR tablet Take 1 tablet (650 mg total) by mouth every 8 (eight) hours as needed for pain. 03/16/19   Ronney Asters, NP  albuterol (PROVENTIL HFA;VENTOLIN HFA) 108 (90 Base) MCG/ACT inhaler Inhale 1-2 puffs into the lungs every 6 (six) hours as needed (wheezing/shortness of breath.).  03/29/18 03/18/22  [provider]  apixaban (ELIQUIS) 2.5 MG TABS tablet Take 1 tablet (2.5 mg total) by mouth 2 (two) times daily. 02/25/22   Lennette Bihari, MD  aspirin EC 81 MG tablet Take 81 mg by mouth daily. Swallow whole.    [provider]  atorvastatin (LIPITOR) 80 MG tablet TAKE 1 TABLET BY MOUTH EVERY DAY AT 6PM Patient taking differently: Take 80 mg by mouth at bedtime. 12/09/17   Lennette Bihari, MD  azelastine (ASTELIN) 0.1 % nasal spray one spray by Both Nostrils route 2 (two) times daily. Use in each nostril as directed Patient not taking: Reported on 03/18/2022 06/06/21  [provider]  Cholecalciferol 125 MCG (5000 UT) capsule Take 1 capsule by mouth every morning.    [provider]  diclofenac Sodium (VOLTAREN) 1 % GEL Apply 2 g topically 4 (four) times daily. 03/20/19   Jodelle Gross, NP  donepezil (ARICEPT) 5 MG tablet Take 5 mg by mouth at bedtime. 05/13/17   [provider]  DULoxetine (CYMBALTA) 60 MG capsule Take 60 mg by mouth at bedtime.    [provider]  ezetimibe (ZETIA) 10 MG tablet Take 1 tablet (10 mg total) by mouth daily. 01/28/22   [facility], Roe Rutherford, PA  finasteride (PROSCAR) 5 MG tablet Take 5 mg by mouth daily. 03/02/22 02/25/23  [provider]  ipratropium (ATROVENT) 0.06 % nasal spray Place 1 spray into the nose 3 (three) times daily as needed (allergies/congestion.).  Patient not taking: Reported on 03/18/2022 04/12/17   [provider]  isosorbide dinitrate (ISORDIL) 30 MG tablet Take 15 mg by mouth at bedtime. 10/24/18   [provider]   lisinopril (ZESTRIL) 5 MG tablet Take 0.5 tablets (2.5 mg total) by mouth at bedtime. 02/25/22   Lennette Bihari, MD  metoprolol tartrate (LOPRESSOR) 50 MG tablet TAKE 1.5 TABLET (75 MG) IN THE AM AND 1 TABLET (50 MG) IN THE PM 04/01/22   Lennette Bihari, MD  nitroGLYCERIN (NITROSTAT) 0.4 MG SL tablet Place 1 tablet (0.4 mg total) under the tongue every 5 (five) minutes as needed for chest pain. 04/28/17   Robbie Lis M, PA-C  omeprazole (PRILOSEC) 20 MG capsule Take 20 mg by mouth at bedtime.    [provider]  predniSONE (DELTASONE) 10 MG tablet Take by mouth. 01/20/22   [provider]  SPIRIVA RESPIMAT 2.5 MCG/ACT AERS SMARTSIG:2 Puff(s) Via Inhaler Daily 02/05/22   [provider]  tamsulosin (FLOMAX) 0.4 MG CAPS Take 0.4 mg by mouth at bedtime.     [provider]    Family History    Family History  Problem Relation Age of Onset   Cancer Mother        stomach   Cancer Sister    Diabetes Brother    Cancer Daughter    He indicated that his mother is deceased. He indicated that his father is deceased. He indicated that his sister is alive. He indicated that his brother is alive. He indicated that his maternal grandmother is deceased. He indicated that his maternal grandfather is deceased. He indicated that his paternal grandmother is deceased. He indicated that his paternal grandfather is deceased. He indicated that the status of his daughter is unknown.  Social History    Social History   Socioeconomic History   Marital status: Married    Spouse name: Not on file   Number of children: Not on file   Years of education: Not on file   Highest education level: Not on file  Occupational History   Not on file  Tobacco Use   Smoking status: Some Days    Packs/day: 0.25    Years: 65.00    Additional pack years: 0.00    Total pack years: 16.25    Types: Cigarettes    Last attempt to quit: 06/14/2017    Years since quitting: 5.1    Smokeless tobacco: Never   Tobacco comments:    smoking a 10 cigarettes a week   Vaping Use   Vaping Use: Some days  Substance and Sexual Activity   Alcohol use: No   Drug  use: No   Sexual activity: Yes  Other Topics Concern   Not on file  Social History Narrative   Not on file   Social Determinants of Health   Financial Resource Strain: Not on file  Food Insecurity: Not on file  Transportation Needs: Not on file  Physical Activity: Not on file  Stress: Not on file  Social Connections: Not on file  Intimate Partner Violence: Not on file     Review of Systems    General:  No chills, fever, night sweats or weight changes.  Cardiovascular:  No chest pain, dyspnea on exertion, edema, orthopnea, palpitations, paroxysmal nocturnal dyspnea. Dermatological: No rash, lesions/masses Respiratory: No cough, dyspnea Urologic: No hematuria, dysuria Abdominal:   No nausea, vomiting, diarrhea, bright red blood per rectum, melena, or hematemesis Neurologic:  No visual changes, wkns, changes in mental status. All other systems reviewed and are otherwise negative except as noted above.  Physical Exam    VS:  BP 126/68   Pulse 61   Ht 5\' 8"  (1.727 m)   Wt 165 lb 3.2 oz (74.9 kg)   SpO2 97%   BMI 25.12 kg/m  , BMI Body mass index is 25.12 kg/m. GEN: Well nourished, well developed, in no acute distress. HEENT: normal. Neck: Supple, no JVD, carotid bruits, or masses. Cardiac: RRR, no murmurs, rubs, or gallops. No clubbing, cyanosis, edema.  Radials/DP/PT 2+ and equal bilaterally.  Respiratory:  Respirations regular and unlabored, clear to auscultation bilaterally. GI: Soft, nontender, nondistended, BS + x 4. MS: no deformity or atrophy. Skin: warm and dry, no rash. Neuro:  Strength and sensation are intact. Psych: Normal affect.  Accessory Clinical Findings    Recent Labs: 08/07/2021: ALT 18; TSH 1.620 01/02/2022: Hemoglobin 12.8; Platelets 187 03/18/2022: BUN 18; Creatinine, Ser  1.48; Potassium 4.0; Sodium 141   Recent Lipid Panel    Component Value Date/Time   CHOL 161 08/07/2021 0936   TRIG 195 (H) 08/07/2021 0936   HDL 38 (L) 08/07/2021 0936   CHOLHDL 4.2 08/07/2021 0936   CHOLHDL 3.0 04/27/2017 0237   VLDL 15 04/27/2017 0237   LDLCALC 90 08/07/2021 0936         ECG personally reviewed by me today-none today.  Echocardiogram 08/13/2021 IMPRESSIONS     1. Left ventricular ejection fraction, by estimation, is 60 to 65%. The  left ventricle has normal function. The left ventricle has no regional  wall motion abnormalities. Left ventricular diastolic parameters were  normal.   2. Right ventricular systolic function is normal. The right ventricular  size is normal. There is normal pulmonary artery systolic pressure.   3. The mitral valve is abnormal. Mild to moderate mitral valve  regurgitation. No evidence of mitral stenosis.   4. The aortic valve was not well visualized. There is moderate  calcification of the aortic valve. There is moderate thickening of the  aortic valve. Aortic valve regurgitation is not visualized. Aortic valve  sclerosis/calcification is present, without  any evidence of aortic stenosis.   5. The inferior vena cava is dilated in size with >50% respiratory  variability, suggesting right atrial pressure of 8 mmHg.   FINDINGS   Left Ventricle: Left ventricular ejection fraction, by estimation, is 60  to 65%. The left ventricle has normal function. The left ventricle has no  regional wall motion abnormalities. The left ventricular internal cavity  size was normal in size. There is   no left ventricular hypertrophy. Left ventricular diastolic parameters  were normal.  Right Ventricle: The right ventricular size is normal. No increase in  right ventricular wall thickness. Right ventricular systolic function is  normal. There is normal pulmonary artery systolic pressure. The tricuspid  regurgitant velocity is 2.63 m/s, and    with an assumed right atrial pressure of 3 mmHg, the estimated right  ventricular systolic pressure is 30.7 mmHg.   Left Atrium: Left atrial size was normal in size.   Right Atrium: Right atrial size was normal in size.   Pericardium: There is no evidence of pericardial effusion.   Mitral Valve: The mitral valve is abnormal. There is mild thickening of  the mitral valve leaflet(s). Mild mitral annular calcification. Mild to  moderate mitral valve regurgitation. No evidence of mitral valve stenosis.   Tricuspid Valve: The tricuspid valve is normal in structure. Tricuspid  valve regurgitation is mild . No evidence of tricuspid stenosis.   Aortic Valve: The aortic valve was not well visualized. There is moderate  calcification of the aortic valve. There is moderate thickening of the  aortic valve. Aortic valve regurgitation is not visualized. Aortic valve  sclerosis/calcification is present,  without any evidence of aortic stenosis.   Pulmonic Valve: The pulmonic valve was normal in structure. Pulmonic valve  regurgitation is not visualized. No evidence of pulmonic stenosis.   Aorta: The aortic root is normal in size and structure.   Venous: The inferior vena cava is dilated in size with greater than 50%  respiratory variability, suggesting right atrial pressure of 8 mmHg.   IAS/Shunts: No atrial level shunt detected by color flow Doppler.    Cardiac catheterization 04/27/2017 Prox LAD lesion is 40% stenosed. Ost Cx to Prox Cx lesion is 25% stenosed. Mid LAD lesion is 50% stenosed. The left ventricular systolic function is normal. LV end diastolic pressure is normal. The left ventricular ejection fraction is 55-65% by visual estimate. There is no aortic valve stenosis. Mid Cx lesion is 95% stenosed. A drug-eluting stent was successfully placed using a STENT SYNERGY DES 3.5X16. Post intervention, there is a 0% residual stenosis.   Culprit circumflex lesion stented.  Double  wire was needed to get balloon around proximal bend.  Guideliner was needed to get the stent around the proximal bend.  Would use EBU 3.5 Guide if cath needed in the future as EBU 3 did not provide great support.     COntinue DAPT for 1 year along with aggressive secondary prevention including smoking cessation.   Diagnostic Dominance: Left  Intervention    Assessment & Plan   1.  DOE, activity intolerance-reports increasing fatigue and activity intolerance over the last several weeks.  Continues to smoke.  Reports that he is using his inhalers properly.  His dyspnea appears to be multifactorial related to smoking, COPD, weight gain, and deconditioning.  I will order echocardiogram and CBC for further prognostication. Order CBC, echocardiogram  Essential hypertension-BP today 126/68.  BMP 03/18/2022 showed stable renal function and electrolytes. Continue metoprolol Heart healthy low-sodium diet-salty 6 given Increase physical activity as tolerated  Coronary artery disease-no chest pain today.  Underwent cardiac catheterization with PCI and DES to his LAD 1/19. Continue current medical therapy Heart healthy low-sodium high-fiber diet Increase physical activity as tolerated  Paroxysmal atrial fibrillation-heart rate today 61.  Reports compliance with apixaban.  Denies bleeding issues.  CHA2DS2-VASc score 4 (HTN, vascular disease, age x 2) Avoid triggers caffeine, chocolate, EtOH, dehydration etc.  Hyperlipidemia-LDL 90 on 08/07/2021. High-fiber diet Increase physical activity as tolerated Continue ezetimibe  Peripheral arterial disease-denies lower extremity claudication.  Underwent arthrectomy/PTA left SFA 10/20.  Lower extremity ultrasound and ABIs 5/23 unchanged from previous. Continue current medical therapy Repeat lower extremity arterial's and ABIs 5/24  Carotid stenosis-denies episodes of lightheadedness, presyncope or syncope.  Status post left carotid endarterectomy 9/09,  right endarterectomy 4/11.  Follow-up ultrasound 5/23 showed 1-39% bilateral ICA stenosis. Continue high-fiber diet Continue  ezetimibe  Tobacco abuse-smoking cessation encouraged. Not interested in smoking cessation at this time.  Disposition: Follow-up with Dr. Tresa Endo or me in 2 months.   Thomasene Ripple. Myangel Summons NP-C     08/03/2022, 4:10 PM [facility] [address] Office [phone] Fax [phone]    I spent 13 minutes examining this patient, reviewing medications, and using patient centered shared decision making involving her cardiac care.  Prior to her visit I spent greater than 20 minutes reviewing her past medical history,  medications, and prior cardiac tests.

## 2022-08-03 ENCOUNTER — Encounter: Payer: Self-pay | Admitting: General Practice

## 2022-08-03 ENCOUNTER — Ambulatory Visit: Payer: BC Managed Care – PPO | Attending: Cardiovascular Disease | Admitting: General Practice

## 2022-08-03 VITALS — BP 126/68 | HR 61 | Ht 68.0 in | Wt 165.2 lb

## 2022-08-03 DIAGNOSIS — I480 Paroxysmal atrial fibrillation: Secondary | ICD-10-CM | POA: Diagnosis not present

## 2022-08-03 DIAGNOSIS — I10 Essential (primary) hypertension: Secondary | ICD-10-CM | POA: Diagnosis not present

## 2022-08-03 DIAGNOSIS — I25119 Atherosclerotic heart disease of native coronary artery with unspecified angina pectoris: Secondary | ICD-10-CM | POA: Diagnosis not present

## 2022-08-03 DIAGNOSIS — I6523 Occlusion and stenosis of bilateral carotid arteries: Secondary | ICD-10-CM

## 2022-08-03 DIAGNOSIS — E785 Hyperlipidemia, unspecified: Secondary | ICD-10-CM

## 2022-08-03 DIAGNOSIS — I739 Peripheral vascular disease, unspecified: Secondary | ICD-10-CM

## 2022-08-03 DIAGNOSIS — Z720 Tobacco use: Secondary | ICD-10-CM

## 2022-08-03 DIAGNOSIS — R5383 Other fatigue: Secondary | ICD-10-CM

## 2022-08-03 DIAGNOSIS — R0609 Other forms of dyspnea: Secondary | ICD-10-CM

## 2022-08-03 NOTE — Patient Instructions (Signed)
Medication Instructions:  The current medical regimen is effective;  continue present plan and medications as directed. Please refer to the Current Medication list given to you today.  *If you need a refill on your cardiac medications before your next appointment, please call your pharmacy*  Lab Work: CBC TODAY If you have labs (blood work) drawn today and your tests are completely normal, you will receive your results only by: MyChart Message (if you have MyChart) OR A paper copy in the mail If you have any lab test that is abnormal or we need to change your treatment, we will call you to review the results.   Testing/Procedures: Echocardiogram - Your physician has requested that you have an echocardiogram. Echocardiography is a painless test that uses sound waves to create images of your heart. It provides your doctor with information about the size and shape of your heart and how well your heart's chambers and valves are working. This procedure takes approximately one hour. There are no restrictions for this procedure.   Follow-Up: At [facility], you and your health needs are our priority.  As part of our continuing mission to provide you with exceptional heart care, we have created designated Provider Care Teams.  These Care Teams include your primary Cardiologist (physician) and Advanced Practice Providers (APPs -  Physician Assistants and Nurse Practitioners) who all work together to provide you with the care you need, when you need it.  We recommend signing up for the patient portal called "MyChart".  Sign up information is provided on this After Visit Summary.  MyChart is used to connect with patients for Virtual Visits (Telemedicine).  Patients are able to view lab/test results, encounter notes, upcoming appointments, etc.  Non-urgent messages can be sent to your provider as well.   To learn more about what you can do with MyChart, go to [email or web address].    Your next  appointment:   2-3 month(s)  Provider:   Nicki Guadalajara, MD  or Edd Fabian, FNP        Other Instructions DO PURSED LIP BREATHING WHEN SHORT OF BREATH EPICALLY WHEN OUTSIDE  PICKUP INCENTIVE SPIROMETER

## 2022-08-04 LAB — CBC
Hematocrit: 41.4 % (ref 37.5–51.0)
Hemoglobin: 14.2 g/dL (ref 13.0–17.7)
MCH: 32.4 pg (ref 26.6–33.0)
MCHC: 34.3 g/dL (ref 31.5–35.7)
MCV: 95 fL (ref 79–97)
Platelets: 249 10*3/uL (ref 150–450)
RBC: 4.38 x10E6/uL (ref 4.14–5.80)
RDW: 12.3 % (ref 11.6–15.4)
WBC: 11.4 10*3/uL — ABNORMAL HIGH (ref 3.4–10.8)

## 2022-09-02 ENCOUNTER — Ambulatory Visit (HOSPITAL_COMMUNITY): Payer: BC Managed Care – PPO | Attending: Cardiology

## 2022-09-02 DIAGNOSIS — R0609 Other forms of dyspnea: Secondary | ICD-10-CM | POA: Insufficient documentation

## 2022-09-02 DIAGNOSIS — R5383 Other fatigue: Secondary | ICD-10-CM | POA: Insufficient documentation

## 2022-09-02 LAB — ECHOCARDIOGRAM COMPLETE
Area-P 1/2: 3.51 cm2
MV M vel: 4.6 m/s
MV Peak grad: 84.5 mmHg
S' Lateral: 3.1 cm

## 2022-09-08 ENCOUNTER — Telehealth: Payer: Self-pay | Admitting: General Practice

## 2022-09-08 NOTE — Telephone Encounter (Signed)
Patient is calling requesting his 06/28 appt with Edd Fabian be made virtual due to him stating he only has one question to ask.   Please advise.

## 2022-09-08 NOTE — Telephone Encounter (Signed)
Called patient, he is able to do vitals from home, advised him to have these ready before his appointment time. Changed appointment type to [facility] virtual and advised patient instructions would come to his mychart.   Patient verbalized understanding.

## 2022-10-07 NOTE — Progress Notes (Signed)
Virtual Visit via Telephone Note   Because of Ralph Dawson's co-morbid illnesses, he is at least at moderate risk for complications without adequate follow up.  This format is felt to be most appropriate for this patient at this time.  The patient did not have access to video technology/had technical difficulties with video requiring transitioning to audio format only (telephone).  All issues noted in this document were discussed and addressed.  No physical exam could be performed with this format.  Please refer to the patient's chart for his consent to telehealth for [facility].  Evaluation Performed:  Follow-up visit  This visit type was conducted due to patient request.  This format is felt to be most appropriate for this patient at this time.  All issues noted in this document were discussed and addressed.  No physical exam was performed (except for noted visual exam findings with Video Visits).  Please refer to the patient's chart (MyChart message for video visits and phone note for telephone visits) for the patient's consent to telehealth for [facility]  Date:  10/09/2022   ID:  Ralph Dawson, DOB Jun 14, 1940, MRN [id number]  Patient Location:  [address] [place] [address]   Provider location:     [facility] [address] Office [phone] Fax [phone]   PCP:  Patient, No Pcp Per  Cardiologist:  Nicki Guadalajara, MD  Electrophysiologist:  None   Chief Complaint: Follow-up  History of Present Illness:    Ralph Dawson is a 82 y.o. male who presents via audio/video conferencing for a telehealth visit today.  Patient verified DOB and address.  Ralph Dawson has a PMH of paroxysmal atrial fibrillation, carotid stenosis, HTN, HLD, tobacco abuse, and PAD.  He underwent cardiac catheterization with PCI and DES to his mid circumflex in 2019.  He underwent left carotid endarterectomy in  2009 and right carotid endarterectomy in 2011.  His PMH also includes intermittent bilateral lower extremity claudication, and COPD.   He followed up with Dr. Tresa Endo 02/25/2022.  He was noted to have atrial fibrillation/flutter at [facility].  He was noted to have stable blood pressure however it was low.  He continued to smoke.  His lisinopril and apixaban were reduced secondary to kidney function, age.  His metoprolol was increased.   He was seen in follow-up by Ralph Levering NP-C on 03/18/2022.  He denied shortness of breath and chest discomfort.  He did note brief episodes of palpitations throughout the day but had not noticed them in the last several days prior to follow-up.  He indicated that the episodes would mainly happen at night when he would lay down.  His blood pressure was noted to be 148/64 and on recheck was 136/80.  He had been checking his blood pressure several times per day but was not checking it daily.  He reported labile blood pressures.  He felt that his fluctuation in blood pressure was due to his recent medication changes.  He did not want to change medication and reported that he was feeling good.  He continued to smoke a pack every 2-1/2 days.  He was not interested in quitting smoking.  He denied bleeding issues.   He presented to the clinic 08/03/22 for follow-up evaluation and stated he wanted supplemental oxygen.  He reported that he was not able to do his normal daily activities without becoming increasingly short of breath.  He  was currently not using home oxygen.  His oxygen saturation was noted to be 97% on room air.  He continued to smoke.  We reviewed his albuterol and steroid inhalers.  He reported that he was using them regularly without improvement in his symptoms.  His blood pressure was 126/68.  He reported that he had gained around 20 pounds.  I encouraged him to stop smoking.  I educated him on pursed lip breathing and incentive spirometry.  I  ordered a CBC  and echocardiogram.  Will plan follow-up been 2 to 3 months.  His echocardiogram was reassuring.  His lab work showed a slightly elevated white blood cell count.  He is connected with via telephone visit today.  He states he is feeling much better and is happy with this medication.  He reports he is breathing much better.  He has been using a nebulizer, albuterol inhalers and steroid inhaler.  We reviewed his echocardiogram and previous lab work.  He expressed understanding.  He has been monitoring his blood pressure and has a blood pressure log which shows well-controlled blood pressure readings.  We will plan follow-up in 12 months.   Today he denies chest pain, lower extremity edema,  palpitations, melena, hematuria, hemoptysis, diaphoresis, weakness, presyncope, syncope, orthopnea, and PND.     Prior CV studies:   The following studies were reviewed today:  Echocardiogram 09/02/2022  IMPRESSIONS   1. Left ventricular ejection fraction, by estimation, is 50 to 55%. The left ventricle has low normal function. The left ventricle has no regional wall motion abnormalities. There is mild left ventricular hypertrophy. Left ventricular diastolic  parameters were normal. 2. Right ventricular systolic function is low normal. The right ventricular size is normal. There is normal pulmonary artery systolic pressure. The estimated right ventricular systolic pressure is 29.8 mmHg. 3. The mitral valve is normal in structure. Mild to moderate mitral valve regurgitation. No evidence of mitral stenosis. 4. Tricuspid valve regurgitation is mild to moderate. 5. The aortic valve is tricuspid. Aortic valve regurgitation is not visualized. Aortic valve sclerosis/calcification is present, without any evidence of aortic stenosis. 6. The inferior vena cava is normal in size with greater than 50% respiratory variability, suggesting right atrial pressure of 3 mmHg.  FINDINGS Left Ventricle: Left ventricular  ejection fraction, by estimation, is 50 to 55%. The left ventricle has low normal function. The left ventricle has no regional wall motion abnormalities. The left ventricular internal cavity size was normal in size.  There is mild left ventricular hypertrophy. Left ventricular diastolic parameters were normal.  Right Ventricle: The right ventricular size is normal. No increase in right ventricular wall thickness. Right ventricular systolic function is low normal. There is normal pulmonary artery systolic pressure. The tricuspid regurgitant velocity is 2.59 m/s, and with an assumed right atrial pressure of 3 mmHg, the estimated right ventricular systolic pressure is 29.8 mmHg.  Left Atrium: Left atrial size was normal in size.  Right Atrium: Right atrial size was normal in size.  Pericardium: There is no evidence of pericardial effusion.  Mitral Valve: The mitral valve is normal in structure. Mild to moderate mitral valve regurgitation. No evidence of mitral valve stenosis.  Tricuspid Valve: The tricuspid valve is normal in structure. Tricuspid valve regurgitation is mild to moderate.  Aortic Valve: The aortic valve is tricuspid. Aortic valve regurgitation is not visualized. Aortic valve sclerosis/calcification is present, without any evidence of aortic stenosis.  Pulmonic Valve: The pulmonic valve was not well visualized. Pulmonic valve regurgitation  is trivial.  Aorta: The aortic root and ascending aorta are structurally normal, with no evidence of dilitation.  Venous: The inferior vena cava is normal in size with greater than 50% respiratory variability, suggesting right atrial pressure of 3 mmHg.  IAS/Shunts: The interatrial septum was not well visualized.   Echocardiogram 08/13/2021 IMPRESSIONS     1. Left ventricular ejection fraction, by estimation, is 60 to 65%. The  left ventricle has normal function. The left ventricle has no regional  wall motion abnormalities. Left  ventricular diastolic parameters were  normal.   2. Right ventricular systolic function is normal. The right ventricular  size is normal. There is normal pulmonary artery systolic pressure.   3. The mitral valve is abnormal. Mild to moderate mitral valve  regurgitation. No evidence of mitral stenosis.   4. The aortic valve was not well visualized. There is moderate  calcification of the aortic valve. There is moderate thickening of the  aortic valve. Aortic valve regurgitation is not visualized. Aortic valve  sclerosis/calcification is present, without  any evidence of aortic stenosis.   5. The inferior vena cava is dilated in size with >50% respiratory  variability, suggesting right atrial pressure of 8 mmHg.   FINDINGS   Left Ventricle: Left ventricular ejection fraction, by estimation, is 60  to 65%. The left ventricle has normal function. The left ventricle has no  regional wall motion abnormalities. The left ventricular internal cavity  size was normal in size. There is   no left ventricular hypertrophy. Left ventricular diastolic parameters  were normal.   Right Ventricle: The right ventricular size is normal. No increase in  right ventricular wall thickness. Right ventricular systolic function is  normal. There is normal pulmonary artery systolic pressure. The tricuspid  regurgitant velocity is 2.63 m/s, and   with an assumed right atrial pressure of 3 mmHg, the estimated right  ventricular systolic pressure is 30.7 mmHg.   Left Atrium: Left atrial size was normal in size.   Right Atrium: Right atrial size was normal in size.   Pericardium: There is no evidence of pericardial effusion.   Mitral Valve: The mitral valve is abnormal. There is mild thickening of  the mitral valve leaflet(s). Mild mitral annular calcification. Mild to  moderate mitral valve regurgitation. No evidence of mitral valve stenosis.   Tricuspid Valve: The tricuspid valve is normal in structure.  Tricuspid  valve regurgitation is mild . No evidence of tricuspid stenosis.   Aortic Valve: The aortic valve was not well visualized. There is moderate  calcification of the aortic valve. There is moderate thickening of the  aortic valve. Aortic valve regurgitation is not visualized. Aortic valve  sclerosis/calcification is present,  without any evidence of aortic stenosis.   Pulmonic Valve: The pulmonic valve was normal in structure. Pulmonic valve  regurgitation is not visualized. No evidence of pulmonic stenosis.   Aorta: The aortic root is normal in size and structure.   Venous: The inferior vena cava is dilated in size with greater than 50%  respiratory variability, suggesting right atrial pressure of 8 mmHg.   IAS/Shunts: No atrial level shunt detected by color flow Doppler.      Cardiac catheterization 04/27/2017 Prox LAD lesion is 40% stenosed. Ost Cx to Prox Cx lesion is 25% stenosed. Mid LAD lesion is 50% stenosed. The left ventricular systolic function is normal. LV end diastolic pressure is normal. The left ventricular ejection fraction is 55-65% by visual estimate. There is no aortic valve stenosis.  Mid Cx lesion is 95% stenosed. A drug-eluting stent was successfully placed using a STENT SYNERGY DES 3.5X16. Post intervention, there is a 0% residual stenosis.   Culprit circumflex lesion stented.  Double wire was needed to get balloon around proximal bend.  Guideliner was needed to get the stent around the proximal bend.  Would use EBU 3.5 Guide if cath needed in the future as EBU 3 did not provide great support.     COntinue DAPT for 1 year along with aggressive secondary prevention including smoking cessation.    Diagnostic Dominance: Left  Intervention       Past Medical History:  Diagnosis Date   Arthritis    "hands, feet" (04/27/2017)   Atrial fibrillation/flutter (HCC)    a. during admission 01/2019 for PAD angiogram - isolated event after allergic  reaction/steroids, monitor planned.   Bradycardia    a. brief episode of nocturnal bradycardia 01/2019 (?NSR with blocked PACs).   CAD (coronary artery disease)    Carotid stenosis 09/24/2011   R ICA patent w/ hx of endarterectomy, stenosis 1-39%;  L ICA patent w/ hx of endarterectomy; external carotids appear patent; see imaging tab for full report   Chest heaviness 02/25/2009   PVCs, atrial bigeminy   CKD (chronic kidney disease), stage III (HCC)    Claudication (HCC) 06/17/2011   LE doppler - bilateral ABIs normal values at rest; R CIA >50% diameter reduction L CIA 0-49% reduction; bilateral SFAs mild/mod mixed density plaque throughout suggesting 50-69% diameter reduction   Contrast media allergy    GERD (gastroesophageal reflux disease)    Hyperlipidemia    Hypertension 02/13/2010   echo - EF >55%; mild mitral annular calcification; mild aortic valve sclerosis   NSTEMI (non-ST elevated myocardial infarction) (HCC) 04/26/2017   [name] 04/27/2017   PAD (peripheral artery disease) (HCC)    a. s/p LLE intervention on 01/2019.   RBBB (right bundle branch block) 02/13/2010   R/P MV - EF 67%; normal perfusion all regions; no significant wall abnormalties noted   Tobacco abuse    Past Surgical History:  Procedure Laterality Date   ABDOMINAL AORTOGRAM W/LOWER EXTREMITY Bilateral 01/12/2019   Procedure: ABDOMINAL AORTOGRAM W/LOWER EXTREMITY;  Surgeon: Runell Gess, MD;  Location: [facility] INVASIVE CV LAB;  Service: Cardiovascular;  Laterality: Bilateral;   APPENDECTOMY     ARTERIAL BYPASS SURGRY     pt unaware of this OR on 04/27/2017   BACK SURGERY     CAROTID ENDARTERECTOMY Right 07/15/2009   Right CEA   CAROTID ENDARTERECTOMY Left 12/13/2007   Left CEA   CORONARY ANGIOPLASTY WITH STENT PLACEMENT  04/27/2017   CORONARY STENT INTERVENTION N/A 04/27/2017   Procedure: CORONARY STENT INTERVENTION;  Surgeon: Corky Crafts, MD;  Location: [facility] INVASIVE CV LAB;  Service: Cardiovascular;   Laterality: N/A;   LAPAROSCOPIC CHOLECYSTECTOMY     LEFT HEART CATH AND CORONARY ANGIOGRAPHY N/A 04/27/2017   Procedure: LEFT HEART CATH AND CORONARY ANGIOGRAPHY;  Surgeon: Corky Crafts, MD;  Location: [facility] INVASIVE CV LAB;  Service: Cardiovascular;  Laterality: N/A;   LUMBAR DISC SURGERY     PERIPHERAL VASCULAR ATHERECTOMY Left 01/12/2019   Procedure: PERIPHERAL VASCULAR ATHERECTOMY;  Surgeon: Runell Gess, MD;  Location: [facility] INVASIVE CV LAB;  Service: Cardiovascular;  Laterality: Left;  SFA   TONSILLECTOMY       Current Meds  Medication Sig   acetaminophen (TYLENOL) 650 MG CR tablet Take 1 tablet (650 mg total) by mouth every 8 (eight) hours as  needed for pain.   apixaban (ELIQUIS) 2.5 MG TABS tablet Take 1 tablet (2.5 mg total) by mouth 2 (two) times daily.   aspirin EC 81 MG tablet Take 81 mg by mouth daily. Swallow whole.   atorvastatin (LIPITOR) 80 MG tablet TAKE 1 TABLET BY MOUTH EVERY DAY AT 6PM   azelastine (ASTELIN) 0.1 % nasal spray    Cholecalciferol 125 MCG (5000 UT) capsule Take 1 capsule by mouth every morning.   diclofenac Sodium (VOLTAREN) 1 % GEL Apply 2 g topically 4 (four) times daily.   donepezil (ARICEPT) 5 MG tablet Take 5 mg by mouth at bedtime.   DULoxetine (CYMBALTA) 60 MG capsule Take 60 mg by mouth at bedtime.   ezetimibe (ZETIA) 10 MG tablet Take 1 tablet (10 mg total) by mouth daily.   finasteride (PROSCAR) 5 MG tablet Take 5 mg by mouth daily.   ipratropium (ATROVENT) 0.06 % nasal spray Place 1 spray into the nose 3 (three) times daily as needed (allergies/congestion.).   isosorbide dinitrate (ISORDIL) 30 MG tablet Take 15 mg by mouth at bedtime.   lisinopril (ZESTRIL) 5 MG tablet Take 0.5 tablets (2.5 mg total) by mouth at bedtime.   metoprolol tartrate (LOPRESSOR) 50 MG tablet TAKE 1.5 TABLET (75 MG) IN THE AM AND 1 TABLET (50 MG) IN THE PM   nitroGLYCERIN (NITROSTAT) 0.4 MG SL tablet Place 1 tablet (0.4 mg total) under the tongue every 5 (five)  minutes as needed for chest pain.   omeprazole (PRILOSEC) 20 MG capsule Take 20 mg by mouth at bedtime.     Allergies:   Plavix [clopidogrel], Contrast media [iodinated contrast media], and Doxycycline   Social History   Tobacco Use   Smoking status: Some Days    Packs/day: 0.25    Years: 65.00    Additional pack years: 0.00    Total pack years: 16.25    Types: Cigarettes    Last attempt to quit: 06/14/2017    Years since quitting: 5.3   Smokeless tobacco: Never   Tobacco comments:    smoking a 10 cigarettes a week   Vaping Use   Vaping Use: Some days  Substance Use Topics   Alcohol use: No   Drug use: No     Family Hx: The patient's family history includes Cancer in his daughter, mother, and sister; Diabetes in his brother.  ROS:   Please see the history of present illness.     All other systems reviewed and are negative.   Labs/Other Tests and Data Reviewed:    Recent Labs: 03/18/2022: BUN 18; Creatinine, Ser 1.48; Potassium 4.0; Sodium 141 08/03/2022: Hemoglobin 14.2; Platelets 249   Recent Lipid Panel Lab Results  Component Value Date/Time   CHOL 161 08/07/2021 09:36 AM   TRIG 195 (H) 08/07/2021 09:36 AM   HDL 38 (L) 08/07/2021 09:36 AM   CHOLHDL 4.2 08/07/2021 09:36 AM   CHOLHDL 3.0 04/27/2017 02:37 AM   LDLCALC 90 08/07/2021 09:36 AM    Wt Readings from Last 3 Encounters:  10/09/22 158 lb (71.7 kg)  08/03/22 165 lb 3.2 oz (74.9 kg)  03/18/22 166 lb 12.8 oz (75.7 kg)     Exam:    Vital Signs:  BP (!) 152/80   Pulse 66   Wt 158 lb (71.7 kg)   BMI 24.02 kg/m    Well nourished, well developed male in no  acute distress.   ASSESSMENT & PLAN:    1.   DOE, activity intolerance-unchanged.  Continues to smoke.  Reports medication compliance.  Again, dyspnea appears to be multifactorial related to smoking, COPD, weight gain, and deconditioning.  I will order echocardiogram and CBC for further prognostication.  Echocardiogram and lab work  reassuring. Follow-up with pulmonology   Essential hypertension-BP today 152/80.  Well-controlled with blood pressure log.  Blood pressures in the 130s over 60s Continue metoprolol Heart healthy low-sodium diet Increase physical activity as tolerated   Paroxysmal atrial fibrillation-heart rate today 66.  Reports compliance with apixaban.  Denies bleeding issues.  CHA2DS2-VASc score 4 (HTN, vascular disease, age x 2) Avoid triggers caffeine, chocolate, EtOH, dehydration etc.-reviewed  Coronary artery disease-denies chest pain.  Had cardiac catheterization with PCI and DES to his LAD 1/19. Continue current medical therapy Heart healthy low-sodium high-fiber diet Increase physical activity as tolerated   Hyperlipidemia-LDL 90 on 08/07/2021. High-fiber diet Increase physical activity as tolerated Continue ezetimibe   Peripheral arterial disease-denies lower extremity claudication.  Underwent arthrectomy/PTA left SFA 10/20.  Lower extremity ultrasound and ABIs 5/23 unchanged from previous. Continue current medical therapy Repeat lower extremity arterial's and ABIs 5/24    Tobacco abuse-smoking cessation encouraged. Continues to be not interested in smoking cessation at this time.   Disposition: Follow-up with Dr. Tresa Endo or me in 12 months.  COVID-19 Education: The signs and symptoms of COVID-19 were discussed with the patient and how to seek care for testing (follow up with PCP or arrange E-visit).  The importance of social distancing was discussed today.  Patient Risk:   After full review of this patients clinical status, I feel that they are at least moderate risk at this time.  Time:   Today, I have spent 7 minutes with the patient with telehealth technology discussing echocardiogram, medications, DOE.     Medication Adjustments/Labs and Tests Ordered: Current medicines are reviewed at length with the patient today.  Concerns regarding medicines are outlined above.   Tests  Ordered: No orders of the defined types were placed in this encounter.  Medication Changes: No orders of the defined types were placed in this encounter.   Disposition:  in 1 year(s)  Signed, Thomasene Ripple. Jilliann Subramanian NP-C    11/15/2018 11:58 AM    [facility] [address] Office [phone] Fax [phone]

## 2022-10-09 ENCOUNTER — Encounter: Payer: Self-pay | Admitting: General Practice

## 2022-10-09 ENCOUNTER — Ambulatory Visit: Payer: BC Managed Care – PPO | Attending: Cardiovascular Disease | Admitting: General Practice

## 2022-10-09 ENCOUNTER — Telehealth: Payer: Self-pay

## 2022-10-09 VITALS — BP 152/80 | HR 66 | Wt 158.0 lb

## 2022-10-09 DIAGNOSIS — I10 Essential (primary) hypertension: Secondary | ICD-10-CM

## 2022-10-09 DIAGNOSIS — I739 Peripheral vascular disease, unspecified: Secondary | ICD-10-CM

## 2022-10-09 DIAGNOSIS — I480 Paroxysmal atrial fibrillation: Secondary | ICD-10-CM | POA: Diagnosis not present

## 2022-10-09 DIAGNOSIS — R0609 Other forms of dyspnea: Secondary | ICD-10-CM

## 2022-10-09 DIAGNOSIS — I25119 Atherosclerotic heart disease of native coronary artery with unspecified angina pectoris: Secondary | ICD-10-CM

## 2022-10-09 NOTE — Progress Notes (Deleted)
TRIED TO CALL PT AT BOTH HM AND CELL NUMBER [id number], WILL TRY AGAIN LATER

## 2022-10-09 NOTE — Patient Instructions (Addendum)
Medication Instructions:  The current medical regimen is effective;  continue present plan and medications as directed. Please refer to the Current Medication list given to you today.  *If you need a refill on your cardiac medications before your next appointment, please call your pharmacy*  Lab Work: NONE If you have labs (blood work) drawn today and your tests are completely normal, you will receive your results only by:  MyChart Message (if you have MyChart) OR  A paper copy in the mail If you have any lab test that is abnormal or we need to change your treatment, we will call you to review the results.  Testing/Procedures: NONE  Other Instructions TAKE AND LOG YOUR BLOOD PRESSURE PLEASE READ AND FOLLOW ATTACHED  SALTY 6-SEE ATTACHED  Follow-Up: At [facility], you and your health needs are our priority.  As part of our continuing mission to provide you with exceptional heart care, we have created designated Provider Care Teams.  These Care Teams include your primary Cardiologist (physician) and Advanced Practice Providers (APPs -  Physician Assistants and Nurse Practitioners) who all work together to provide you with the care you need, when you need it.  Your next appointment:   12 month(s)    Provider:   Nicki Guadalajara, MD

## 2022-10-09 NOTE — Telephone Encounter (Signed)
TRIED TO CALL PT AT BOTH HM AND CELL NUMBER [id number], WILL TRY AGAIN LATER PLEASE TRANSFER CALL BACK TO [phone]

## 2023-01-08 ENCOUNTER — Other Ambulatory Visit: Payer: Self-pay

## 2023-01-08 ENCOUNTER — Other Ambulatory Visit: Payer: Self-pay | Admitting: Cardiovascular Disease

## 2023-01-08 MED ORDER — EZETIMIBE 10 MG PO TABS: 10.0000 mg | ORAL_TABLET | Freq: Every day | ORAL | 2 refills | Status: DC

## 2023-07-29 ENCOUNTER — Other Ambulatory Visit: Payer: Self-pay | Admitting: Cardiovascular Disease

## 2023-10-26 ENCOUNTER — Other Ambulatory Visit: Payer: Self-pay | Admitting: Cardiovascular Disease

## 2023-12-27 ENCOUNTER — Other Ambulatory Visit: Payer: Self-pay | Admitting: General Practice

## 2023-12-28 MED ORDER — LISINOPRIL 5 MG PO TABS: 2.5000 mg | ORAL_TABLET | Freq: Every day | ORAL | 0 refills | Status: DC

## 2023-12-28 MED ORDER — EZETIMIBE 10 MG PO TABS: 10.0000 mg | ORAL_TABLET | Freq: Every day | ORAL | 0 refills | Status: DC

## 2023-12-29 ENCOUNTER — Other Ambulatory Visit: Payer: Self-pay | Admitting: General Practice

## 2023-12-29 MED ORDER — EZETIMIBE 10 MG PO TABS: 10.0000 mg | ORAL_TABLET | Freq: Every day | ORAL | 0 refills | Status: DC

## 2023-12-29 NOTE — Addendum Note (Signed)
 Addended by: Auren Valdes on: 12/29/2023 01:08 PM   Modules accepted: Orders

## 2024-01-29 ENCOUNTER — Other Ambulatory Visit: Payer: Self-pay | Admitting: General Practice

## 2024-03-28 NOTE — ED Provider Notes (Signed)
 " [facility]  ED Provider Note  Ralph Dawson 83 y.o. male DOB: [birth year] MRN: [id number] History   Chief Complaint  Patient presents with   Visual Changes    Patient reports over the last month he has had several episodes where his vision goes all of the way out. Patient reports that the most recent time this has happened was last night. Patient reports this lasted for several seconds.     Patient comes today after having a couple of episodes of vision loss.  It first happened last week.  He tells me his vision gets blurry for a few seconds then goes black for a few seconds.  The whole thing lasted less than a minute.  Then he is back to normal.  He said the first time this happened his wife told him he was waving his arms around in the air and unable to speak.  The second time it happened yesterday he was not waving his arms around.  Apparently told his oncologist he had some chest pain yesterday so they decided to send him here.  He currently tells me he feels fine.  He had no nausea or vomiting.  No chest pain or short of breath today.  No belly pain or back pain.  No weakness numbness tingling in arms or legs.   History provided by:  Patient Language interpreter used: No   Visual Changes Associated symptoms: no headaches, no nausea and no vomiting        Past Medical History:  Diagnosis Date   A-fib (*)    Back pain    BPH (benign prostatic hyperplasia)    Claudication of left lower extremity    intermittant   COPD (chronic obstructive pulmonary disease) (*)    Coronary artery disease    Depression    Dizziness    NEW, SEEN CARDIOLOGIST APPROX. 2 WEEKS AGO NO REPORT PER PT.  02/11/22   Dry mouth    GERD (gastroesophageal reflux disease)    Hyperlipidemia    Hypertension    Memory disorder    SHORT TERM   Mitral regurgitation    mild- per cardiology   Myocardial infarction (*) 06/2016   Seen [facility]   Right bundle  branch block    S/P CABG (coronary artery bypass graft) 2019   stents x2   Stage 3 chronic kidney disease (*)    Teeth missing    FULL UPPER AND LOWER PLATES   Tricuspid regurgitation    mild- per cardiology    Past Surgical History:  Procedure Laterality Date   Appendectomy     Back surgery     no hardware   Blepharoplasty Bilateral    Carotid endarterectomy Bilateral    Cataract extraction Bilateral    Cholecystectomy     Colonoscopy     unknown   Coronary angioplasty with stent placement     STENTS X 2.    Social History   Substance and Sexual Activity  Alcohol  Use No   Tobacco Use History[1] E-Cigarettes   Vaping Use Never User    Start Date     Cartridges/Day     Quit Date     Social History   Substance and Sexual Activity  Drug Use No   Tetanus up to date?: Unknown Immunizations Up to Date?: Unknown   Allergies[2]  Home Medications   ACETAMINOPHEN  (TYLENOL  ARTHRITIS,MAPAP) 650 MG CR TABLET    Take three tablets (1,950 mg dose) by  mouth every 8 (eight) hours as needed for Pain.   ALBUTEROL  (ACCUNEB ) 1.25 MG/3ML NEBULIZER SOLUTION    Take 3 mLs (1.25 mg dose) by nebulization every 6 (six) hours as needed for Wheezing.   ALPRAZOLAM (XANAX) 0.5 MG TABLET    Take 0.5 to 1 tablet three times a day as needed for anxiety.   ASPIRIN  (ECOTRIN LOW DOSE) EC TABLET    Take one tablet (81 mg dose) by mouth every morning.   AZITHROMYCIN (ZITHROMAX Z-PAK) 250 MG TABLET    Take 2 tablets (500 mg) on  Day 1,  followed by 1 tablet (250 mg) once daily on Days 2 through 5.   BUDESON-GLYCOPYRROL-FORMOTEROL (BREZTRI AEROSPHERE) 160-9-4.8 MCG/ACT AERO INHALER    Inhale 2 puffs into the lungs 2 (two) times daily.   COLCHICINE 0.6 MG TABLET    DAY 1: TAKE 1.2 MG (2 TABLETS) BY MOUTH AT THE FIRST SIGN OF FLARE, FOLLOWED BY 0.6 MG (1 TABLET) AFTER 1 HOUR MAXIMUM TOTAL DOSE: 1.8 MG/DAY (3 TABLETS) ON DAY 1. DAY 2 AND THEREAFTER: TAKE 0.6 MG (1 TABLET) ONCE DAILY UNTIL  FLARE RESOLVES   DEXAMETHASONE  (DECADRON ) 4 MG TABLET    Take two tablets (8 mg dose) by mouth daily. Take 2 tablet by mouth once daily for 3 days after Chemotherapy, repeat after each cycle   DIPHENOXYLATE-ATROPINE  (LOMOTIL) 2.5-0.025 MG PER TABLET    Take one tablet by mouth 4 (four) times a day as needed for Diarrhea. Max Daily Amount: 4 tablets   DONEPEZIL  HCL (ARICEPT ) 5 MG TABLET    TAKE 1 TABLET BY MOUTH EVERYDAY AT BEDTIME   DULOXETINE  HCL (CYMBALTA ) 60 MG CAPSULE    TAKE ONE CAPSULE (60 MG DOSE) BY MOUTH DAILY. TAKE 1 CAPSULE DAILY   ELIQUIS  2.5 MG TABLET    TAKE ONE TABLET BY MOUTH 2 TIMES DAILY.   EZETIMIBE  (ZETIA ) 10 MG TABLET    Take one tablet (10 mg dose) by mouth every evening.   FAMOTIDINE  (PEPCID ) 20 MG TABLET    Take one tablet (20 mg dose) by mouth every morning.   FINASTERIDE (PROSCAR) 5 MG TABLET    TAKE ONE TABLET BY MOUTH DAILY.   FLUTICASONE PROPIONATE (FLONASE) 50 MCG/ACTUATION NASAL SPRAY    one spray by Both Nostrils route daily.   FUROSEMIDE (LASIX) 20 MG TABLET    TAKE ONE TABLET BY MOUTH DAILY.   HYDROCODONE-HOMATROPINE (HYCODAN,HYDROMET) 5-1.5 MG/5 ML SOLUTION    Take 5 mLs by mouth at bedtime for 10 days. Use as needed for cough. Max Daily Amount: 5 mLs   HYDROMORPHONE (DILAUDID) 2 MG TABLET    Take one tablet (2 mg dose) by mouth every 4 (four) hours as needed for Pain.   LIDOCAINE -PRILOCAINE (EMLA) CREAM    Apply topically as needed. apply topically to portacath site as needed 1-2 hours prior to accessing portacath   LOPERAMIDE  (IMODIUM ) 2 MG CAPSULE    Take one capsule (2 mg dose) by mouth 4 (four) times a day as needed for Diarrhea.   LORATADINE (CLARITIN) 10 MG TABLET    Take one tablet (10 mg dose) by mouth every morning.   NEBIVOLOL (BYSTOLIC) 10 MG TABLET    Take one tablet (10 mg dose) by mouth daily.   ONDANSETRON  (ZOFRAN ) 8 MG TABLET    Take one tablet (8 mg dose) by mouth every 8 (eight) hours as needed for Nausea (or vomiting).   PROCHLORPERAZINE  (COMPAZINE) 5 MG TABLET    Take one tablet (  5 mg dose) by mouth every 6 (six) hours as needed for Nausea (or vomiting).   TAMSULOSIN  (FLOMAX ) 0.4 MG CAPS    TAKE 2 CAPSULES (0.8 MG DOSE) BY MOUTH DAILY    Primary Survey  Primary Survey  Review of Systems   Review of Systems  Constitutional:  Negative for chills and fever.  Eyes:  Positive for visual disturbance.  Respiratory:  Negative for cough and shortness of breath.   Gastrointestinal:  Negative for abdominal pain, diarrhea, nausea and vomiting.  Genitourinary:  Negative for dysuria.  Musculoskeletal:  Negative for back pain.  Skin:  Negative for rash.  Neurological:  Negative for syncope and headaches.    Physical Exam   ED Triage Vitals [03/28/24 1603]  BP 121/73  Heart Rate 87  Resp 18  SpO2 96 %  Temp 98.1 F (36.7 C)    Physical Exam  Nursing note and vitals reviewed. Constitutional: He appears well-developed and well-nourished.  HENT:  Head: Normocephalic and atraumatic.  Mouth/Throat: Voice normal.  Eyes: EOM are intact. Pupils are equal, round, and reactive to light.  Neck: Normal range of motion and voice normal. Neck supple.  Cardiovascular: Normal rate, regular rhythm, normal heart sounds and intact distal pulses.  Pulmonary/Chest: Respiratory effort normal and breath sounds normal.  Abdominal: Soft. Bowel sounds are normal.  Musculoskeletal: Normal range of motion.     Cervical back: Normal range of motion and neck supple. no edema.  Neurological: He is alert and oriented to person, place, and time. Moves all extremities equally. He has normal speech.  Skin: Skin is warm. Skin is dry.  Psychiatric: He has a normal mood and affect.     ED Course   Lab results:   NT-PROBNP - Abnormal      Result Value   NT-ProBNP 2,492 (*)    Comment: Among patients presenting with dyspnea, NT-proBNP is highly sensitive for the detection of acute decompensated heart failure (ADHF). In addition, a NT-proBNP when  used with the recommended age-independent exclusionary cut-point of <300 pg/mL effectively rules out ADHF with a negative predictive value (NPV) ranging from 97.7% for males to 98.5% for females.  D-DIMER, QUANTITATIVE - Abnormal   D-Dimer 1.03 (*)    Comment: The results of D-Dimer testing should be evaluated in the context of all clinical and laboratory data available. D-Dimer cut off value is 0.50 ug/ml FEU. D-Dimer assay can be used to exclude DVT & PE, but should not be used to exclude patients with: - Therapeutic dose anticoagulant therapy for >24 hours.  - Fibrinolytic therapy within previous 7 days  - Trauma or surgery within previous 4 weeks  - Disseminated malignancies  - Aortic aneurysm  - Sepsis, severe infections, pneumonia, severe skin infections  - Liver cirrhosis  - Pregnancy   GEN5 CARDIAC TROPONIN T (TNT5) BASELINE - Abnormal   TnT-Gen5 (0hr) 22 (*)    Comment: An elevated Troponin indicates myocardial damage. Elevated troponin may also be due to pulmonary emboli, aortic dissection, heart failure, trauma, toxins and ischemia in the setting of critical illness.   GEN5 CARDIAC TROPONIN T(TNT5) 1 HOUR - Abnormal   TnT-Gen5 (1hr) 23 (*)    Comment: An elevated Troponin indicates myocardial damage. Elevated troponin may also be due to pulmonary emboli, aortic dissection, heart failure, trauma, toxins and ischemia in the setting of critical illness.   Delta 1 Hour 1    COVID-19, FLU A+B AND RSV - Normal   Flu A Negative  Flu B Negative     RSV PCR Negative     SARS-COV-2 Not Detected     Narrative:    SARS-COV-2 (COVID-19)PCR-Negative results do not preclude SARS-CoV-2 infection and should not be used as the sole basis for patient management decisions. Negative results must be combined with clinical observations, patient history, and epidemiological information.  Flu and/or RSV - Negative results do not preclude the presence of Flu or RSV virus and should not be used as  the sole basis for treatment or other patient management decisions. False negative results may occur if virus is present at levels below the analytical limit of detection.  This test detects Influenza A, Influenza B, and Respiratory Syncytial Virus and SARS-COV-2 (COVID-19) by PCR.     MAGNESIUM - Normal   Mg 2.0    CULTURE, BLOOD  CULTURE, BLOOD  MRSA PCR  GEN5 CARDIAC TROPONIN T(TNT5) 3 HOUR    Imaging:   XR CHEST AP PORTABLE   Narrative:    COMPARISON:01/21/2024   TECHNIQUE:  XR CHEST AP PORTABLE  INDICATION: Shortness of breath Respiratory Distress   FINDINGS:  XR CHEST  Right internal jugular Port-A-Cath tip at cavoatrial junction. Enlarged cardiomediastinal silhouette.   Worsening right lung airspace disease/pulmonary edema.  No pneumothorax identified.  Small right pleural effusion. Suboptimal visualization of previously identified nodular densities.    Impression:    IMPRESSION:  XR CHEST  Worsening right lung airspace disease/pulmonary edema.       Electronically Signed by: Charlie Patch, MD on 03/28/2024 6:37 PM  CT HEAD WO CONTRAST   Narrative:    INDICATION: Altered Mental Status.    COMPARISON:  CT May 23, 2023   TECHNIQUE:    Multiple axial CT images obtained from the skull base to vertex without IV contrast. Dose reduction was utilized (automated exposure control, mA or kV adjustment based on patient size, or iterative image   FINDINGS:    No acute infarction is evident. Acute infarct may not be initially visible on CT.   No intracranial hemorrhage.  White matter is grossly intact. No intracranial mass, mass effect, or midline shift.    Ventricles: No hydrocephalus.  Orbits:Normal Visualized paranasal sinuses: Clear. Visualized mastoid air cells: Clear. Calvarium: Intact.       Impression:    IMPRESSION: 1.  No acute intracranial abnormality.    Electronically Signed by: Dallas Jubilee, MD on 03/28/2024 8:48 PM  CT CHEST WO  CONTRAST     ECG: ECG Results          ECG 12 lead (Final result)      Collection Time Result Time Acquisition Device Systolic BP Diastolic BP Ventricular Rate Atrial Rate QRS Duration Q-T Interval QTC Calculation(Bazett) Calculated R Axis Calculated T Axis   03/28/24 19:10:18 03/28/24 20:14:53 D3K 123 91 96 70 114 386 487 -63 72         Collection Time Result Time ECGDiag   03/28/24 19:10:18 03/28/24 20:14:53 Atrial fibrillation Left axis deviation Incomplete right bundle branch block Nonspecific T wave abnormality Prolonged QT Abnormal ECG When compared with ECG of 28-Mar-2024 17:50, No significant change was found Pylant, Andrew ([id number]) on 03/28/2024 8:14:48 PM certifies that he/she has reviewed the ECG tracing and confirms the independent interpretation is correct.               Final result  ECG 12 lead (Final result)      Collection Time Result Time Acquisition Device Systolic BP Diastolic BP Ventricular Rate Atrial Rate QRS Duration Q-T Interval QTC Calculation(Bazett) Calculated R Axis Calculated T Axis   03/28/24 17:50:55 03/28/24 20:14:08 D3K 126 73 98 250 126 380 485 -63 29         Collection Time Result Time ECGDiag   03/28/24 17:50:55 03/28/24 20:14:08 Atrial fibrillation Left axis deviation Right bundle branch block Inferior infarct , age undetermined Abnormal ECG When compared with ECG of 15-Jul-2023 13:39, Right bundle branch block has replaced Incomplete right bundle branch block Criteria for Septal infarct are no longer present Inferior infarct is now present PylantPrentice ([id number]) on 03/28/2024 8:14:07 PM certifies that he/she has reviewed the ECG tracing and confirms the independent interpretation is correct.               Final result                               HEAR Score History: Mix of high & low features ECG: Non specific repolarisation disturbance Age: 78 or greater Risk  Factors: More than or equal 3 risk factors or history of atherosclerotic disease HEAR Score Total: 6                                                           Pre-Sedation Procedures    Medical Decision Making The differential diagnosis associated with the patient's presentation includes: Visual disturbance, TIA, pneumonia, pneumothorax, side effect of chemotherapy  I considered admitting/observing and elected to, admit the patient due to pneumonia.  Problems Addressed: Pneumonia of right lung due to infectious organism, unspecified part of lung: acute illness or injury Visual disturbance: acute illness or injury  Amount and/or Complexity of Data Reviewed Labs: ordered. Decision-making details documented in ED Course. Radiology: ordered and independent interpretation performed. Decision-making details documented in ED Course.    Details: I personally viewed and interpreted CT images of the head: There is no acute finding ECG/medicine tests: ordered and independent interpretation performed. Decision-making details documented in ED Course. Discussion of management or test interpretation with external provider(s): Dr Suzy PUTTY for admission  Risk Prescription drug management. Decision regarding hospitalization.          Provider Communication  New Prescriptions   No medications on file    Modified Medications   No medications on file    Discontinued Medications   No medications on file    Clinical Impression Final diagnoses:  Visual disturbance  Pneumonia of right lung due to infectious organism, unspecified part of lung    ED Disposition     ED Disposition  Admit   Condition  --   Comment  --                   Electronically signed by:       [1] Social History Tobacco Use  Smoking Status Every Day   Current packs/day: 0.50   Average packs/day: 0.5 packs/day for 74.0 years (37.0 ttl pk-yrs)    Types: Cigarettes   Start date: 1952   Passive exposure: Current  Smokeless Tobacco Former  [2] Allergies Allergen Reactions   Clopidogrel  Other    Causes  severe pain in hands with dizziness.    Iodinated Diagnostic Agents Itching    Caused itching. Possibly caused SVT vs afib post-op  Patient states no reaction to this   Doxycycline Rash    Patient states to reaction to this medication   Prentice Left, MD 03/28/24 2051  "

## 2024-03-29 NOTE — ACP (Advance Care Planning) (Signed)
 Patient is a Full Code, Contact is WifeGLENWOOD [phone],

## 2024-04-02 NOTE — Progress Notes (Signed)
 "  PRIMARY CARE PHYSICIAN: Tylene KATHEE Meres, PA-C  PRIMARY CARDIOLOGIST: Patient has been seen by Dr. Debby Sor at [facility] and was last seen in 09/2022.    Reason for Visit: Chief Complaint  Patient presents with   Follow-up    Chest pain ED follow up form 02/27/24   Chest Pain   Atrial Fibrillation    History of Present Illness  Ralph Dawson is a 83 y.o. male with past medical history inclusive of but not limited to coronary artery disease, status post PCI with DES to left circumflex in January 2019, paroxysmal atrial fibrillation/flutter on chronic anticoagulation with Eliquis , peripheral artery disease, current smoker 1/2 pk/day, hyperlipidemia, hypertension, left carotid endarterectomy 2009 and right carotid endarterectomy in 2011, COPD.   Patient presents to clinic today for 72-hour chest pain ED follow-up.  He presented to [facility] emergency department on 03/28/2024 with symptoms of visual changes.  He reported blurry vision, then things go black goes black and then loses his vision.  Reports episode last than 1 minute.  His wife told him he was waving his arms around in the air and unable to speak. It was suspected patient may have seizure like episodes. Patient declined MRI, pulled off monitor, IVs and left AMA.   He reported to his oncologist that he had some chest pain and was instructed to proceed to the emergency department. Reported chest pressure right chest wall. Non radiated, nonexertional, non reproducible on palpation  No nausea, vomiting, dizziness, diaphoresis, orthopnea, palpitations or syncope On arrival to the emergency department he denied chest pain or shortness of breath. His TnT-Gen 5 were 22 > 23 (1)> 21 (-1). Admission EKG showed Atrial Fibrillation with ventricular rate 98 bpm, right BBB.  His potassium was 4.5, BUN 14, creatinine 1.50, GFR 46, NT proBNP 2492, hemoglobin 11.8 platelet count 336,000.  Reports his heart rate typically ranging between 82 and  110 beats per minute. He is currently on a regimen of  Bystolic 10mg  daily and Eliquis  2.5 mg twice daily. Denies episodes of brady or tachycardia. Blood pressure and heart rate is monitored several times throughout the day, although not consistently before the morning dose of Bystolic.  His creatinine level was elevated to 1.5, which precluded receiving Keytruda treatment three weeks ago. He is hopeful for an improvement in creatinine levels at the upcoming appointment tomorrow. His Lisinopril  was discontinued by his PCP on 03/21/24 due to AKI. Patient is taking  Lasix 20 mg daily. Reports fluid intake of 48 ounces of water daily.     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 which was culprit lesion Underwent PCI with DES to mid circumflex    REVIEW OF SYSTEMS: All systems reviewed and are negative except as mentioned above in the HPI   Reviewed and updated this visit: Tobacco  Allergies  Medications  Problems  Med Hx  Surg Hx  Fam Hx    PAST MEDICAL HISTORY / PROBLEM LIST: Past Medical History:  Diagnosis Date   A-fib (*)    Back pain    BPH (benign prostatic hyperplasia)    Claudication of left lower extremity    intermittant   COPD (chronic obstructive pulmonary disease) (*)    Coronary artery disease    Depression    Dizziness    NEW, SEEN CARDIOLOGIST APPROX. 2 WEEKS AGO NO REPORT PER PT.  02/11/22   Dry mouth  GERD (gastroesophageal reflux disease)    Hyperlipidemia    Hypertension    Memory disorder    SHORT TERM   Mitral regurgitation    mild- per cardiology   Myocardial infarction (*) 06/2016   Seen [facility]   Right bundle branch block    S/P CABG (coronary artery bypass graft) 2019   stents x2   Stage 3 chronic kidney disease (*)    Teeth missing    FULL UPPER AND LOWER PLATES   Tricuspid regurgitation    mild- per cardiology     MEDICATIONS: Current Medications[1]  ALLERGIES Allergies[2]  OBJECTIVE: BP (!) 106/56 (BP Location: Left Upper Arm, Patient Position: Sitting)   Pulse 60   Wt 150 lb (68 kg)   SpO2 99%   BMI 22.81 kg/m   PHYSICAL EXAM: General: comfortable, no acute distress Neuro: Oriented to time/person/place Neck: No JVD CV: S1 S2,  Regular rate and rhythm. No murmurs, rubs, or gallops. Lungs: Clear to ausculation bilaterally.  Abd: Soft, non tender.  Ext: 2+ DP, PT and radial pulses bilaterally; no peripheral edema Skin: Warm and dry.     LABS: Lab Results  Component Value Date   Hemoglobin A1c 5.7 (H) 05/23/2023   Cholesterol, Total 110 09/15/2023   HDL 30 (L) 09/15/2023   LDL 53 09/15/2023   Triglycerides 160 (H) 09/15/2023   WBC 8.5 03/31/2024   HGB 10.8 (L) 03/31/2024   Plt Ct 278 03/31/2024   Creatinine 1.20 03/31/2024   BUN 13 03/31/2024   Na 135 03/31/2024   Potassium 4.4 03/31/2024   Cl 104 03/31/2024   CO2 26 03/31/2024   INR 1.0 05/23/2023   TSH 3.050 03/28/2024   ALT 15 03/31/2024   AST 21 03/31/2024   ALK PHOS 81 03/31/2024   T Bili 0.9 03/31/2024    12 Lead EKG: Hospital Encounter on 03/28/24  ECG 12 lead  Result Value Ref Range   Acquisition Device D3K    Systolic BP 123 mmHg   Diastolic BP 91 mmHg   Ventricular Rate 96 BPM   Atrial Rate 70 BPM   QRS Duration 114 ms   Q-T Interval 386 ms   QTC Calculation(Bazett) 487 ms   Calculated R Axis -63 degrees   Calculated T Axis 72 degrees   ECG Diagnosis      Atrial fibrillation Left axis deviation Incomplete right bundle branch block Nonspecific T wave abnormality Prolonged QT Abnormal ECG When compared with ECG of 28-Mar-2024 17:50, No significant change was found Pylant, Prentice ([id number]) on 03/28/2024 8:14:48 PM certifies that he/she has reviewed the ECG tracing and confirms the independent interpretation is correct.      ASSESSMENT/PLAN: 83 y.o. male with PMH as outlined in the HPI.  At  this time patient appears to be in no acute distress and stable from a cardiac standpoint.  Assessment & Plan 1. Paroxysmal Atrial fibrillation: - Patient declined EKG today stating he had EKG 2 days ago. EKG from 12/16 showed atrial fibrillation with ventricular rate of 96 bpm - Currently on Eliquis  2.5 mg for stroke prevention. On Bystolic 10mg  daily.  - An echocardiogram will be scheduled to assess cardiac function,wall motion and valvular function.   2. Hypertension - BP in clinic today is 106/56. - Currently on Bystolic and Lasix 20 mg. - Blood pressure may be low, causing dehydration, dry mouth, dizziness, and lightheadedness. - Advised to monitor blood pressure and heart rate prior to taking medications in the morning and evening. - Lasix  dosage will be reduced to every other day to help kidneys recover quicker and manage blood pressure better.  3. AKI - Creatinine level is slightly elevated at 1.5, which prevented receiving Keytruda treatment three weeks ago. - Reducing Lasix dosage to every other day may help improve kidney function.  Follow-up: Patient has not been seen in our [facility] Practice. States he wants to transfer care from [facility] to [facility]. Follow-up pending results of echocardiogram. I will request follow up with first available Cardiologist to establish new patient visit to establish care.    PATIENT INSTRUCTIONS Eat a Heart Healthy Diet (Mediterranean diet with low saturated fats, carbs, and sodium)  Engage in 30 minutes of moderate intensity activity at least 5 days a week. Take your Medications as prescribed Contact our office should patient have symptoms of angina, shortness of breath, lower extremity edema, palpitations, dizziness, near syncope or syncope Contact our office should patient have any problems taking your medications, worsening medical condition, or any questions regarding our treatment plan from today.    Patient was given ample  opportunity to ask questions which were all addressed and agrees with the above listed plan.    After visit summary will be given to patient at checkout.    Documentation for time-based billing:  Total time spent of date of service was 40 minutes.  Patient care activities included preparing to see the patient such as reviewing the patient record, obtaining and/or reviewing separately obtained history, performing a medically appropriate history and physical examination, counseling and educating the patient, family, and/or caregiver, ordering prescription medications, tests, or procedures, documenting clinical information in the electronic or other health record, independently interpreting results when not separately reported, communicating results to the patient/family/caregiver and coordinating the care of the patient when not separately reported.  This note was dictated with voice recognition software. I have carefully reviewed this note and despite this, minor syntax, contextual, and spelling error may be present and despite my review may be inadvertently overlooked and are related to the use of this software and were not intentional       Olam Jenkins Brewster, NP 04/02/2024, 9:46 PM         [1] Current Outpatient Medications  Medication Sig Dispense Refill   acetaminophen  (TYLENOL  ARTHRITIS,MAPAP) 650 MG CR tablet Take three tablets (1,950 mg dose) by mouth every 8 (eight) hours as needed for Pain.     albuterol  (ACCUNEB ) 1.25 MG/3ML nebulizer solution Take 3 mLs (1.25 mg dose) by nebulization every 6 (six) hours as needed for Wheezing. 720 mL 0   ALPRAZolam (XANAX) 0.5 mg tablet Take 0.5 to 1 tablet three times a day as needed for anxiety. 180 tablet 0   aspirin  (ECOTRIN LOW DOSE) EC tablet Take one tablet (81 mg dose) by mouth every morning.     budeson-glycopyrrol-formoterol (BREZTRI AEROSPHERE) 160-9-4.8 MCG/ACT AERO inhaler Inhale 2 puffs into the lungs 2 (two) times daily. 32.1 g 1    colchicine 0.6 mg tablet DAY 1: TAKE 1.2 MG (2 TABLETS) BY MOUTH AT THE FIRST SIGN OF FLARE, FOLLOWED BY 0.6 MG (1 TABLET) AFTER 1 HOUR MAXIMUM TOTAL DOSE: 1.8 MG/DAY (3 TABLETS) ON DAY 1. DAY 2 AND THEREAFTER: TAKE 0.6 MG (1 TABLET) ONCE DAILY UNTIL FLARE RESOLVES 90 tablet 0   dexamethasone  (DECADRON ) 4 mg tablet Take two tablets (8 mg dose) by mouth daily. Take 2 tablet by mouth once daily for 3 days after Chemotherapy, repeat after each cycle  diphenoxylate-atropine  (LOMOTIL) 2.5-0.025 mg per tablet Take one tablet by mouth 4 (four) times a day as needed for Diarrhea. Max Daily Amount: 4 tablets 60 tablet 1   donepezil  HCl (ARICEPT ) 5 mg tablet TAKE 1 TABLET BY MOUTH EVERYDAY AT BEDTIME 90 tablet 1   DULoxetine  HCl (CYMBALTA ) 60 mg capsule Take one capsule (60 mg dose) by mouth daily. 90 capsule 3   ELIQUIS  2.5 MG tablet TAKE ONE TABLET BY MOUTH 2 TIMES DAILY. 180 tablet 1   ezetimibe  (ZETIA ) 10 MG tablet Take one tablet (10 mg dose) by mouth every evening. 90 tablet 0   famotidine  (PEPCID ) 20 mg tablet Take one tablet (20 mg dose) by mouth every morning.     finasteride (PROSCAR) 5 mg tablet TAKE ONE TABLET BY MOUTH DAILY. 90 tablet 1   fluticasone propionate (FLONASE) 50 mcg/actuation nasal spray one spray by Both Nostrils route daily. 16 g 2   furosemide (LASIX) 20 mg tablet TAKE ONE TABLET BY MOUTH DAILY. 90 tablet 1   HYDROcodone-homatropine (HYCODAN,HYDROMET) 5-1.5 mg/5 mL solution Take 5 mLs by mouth at bedtime for 10 days. Use as needed for cough. Max Daily Amount: 5 mLs 120 mL 0   HYDROmorphone (DILAUDID) 2 mg tablet Take one tablet (2 mg dose) by mouth every 4 (four) hours as needed for Pain.     lidocaine -prilocaine (EMLA) cream Apply topically as needed. apply topically to portacath site as needed 1-2 hours prior to accessing portacath 30 g 0   loperamide  (IMODIUM ) 2 MG capsule Take one capsule (2 mg dose) by mouth 4 (four) times a day as needed for Diarrhea.      loratadine (CLARITIN) 10 MG tablet Take one tablet (10 mg dose) by mouth every morning.     nebivolol (BYSTOLIC) 10 MG tablet Take one tablet (10 mg dose) by mouth daily.     ondansetron  (ZOFRAN ) 8 MG tablet Take one tablet (8 mg dose) by mouth every 8 (eight) hours as needed for Nausea (or vomiting). 30 tablet 2   prochlorperazine (COMPAZINE) 5 MG tablet Take one tablet (5 mg dose) by mouth every 6 (six) hours as needed for Nausea (or vomiting). 30 tablet 2   tamsulosin  (FLOMAX ) 0.4 mg CAPS TAKE 2 CAPSULES (0.8 MG DOSE) BY MOUTH DAILY 180 capsule 1   No current facility-administered medications for this visit.  [2] Allergies Allergen Reactions   Clopidogrel  Other    Causes severe pain in hands with dizziness.    Iodinated Diagnostic Agents Itching    Caused itching. Possibly caused SVT vs afib post-op  Patient states no reaction to this   Doxycycline Rash    Patient states to reaction to this medication  "

## 2024-04-21 NOTE — Progress Notes (Signed)
 ONCOLOGY PROGRESS NOTE  PATIENT NAME: Ralph Dawson MRN#: [id number] DOB: Dec 29, 1940  Age: 84 y.o.   DATE: 04/21/2024  ONCOLOGY DIAGNOSIS: 04/05/2023 metastatic lung cancer     ONCOLOGY TREATMENT HISTORY: 03/05/2023 CT chest = enlarging left upper lobe nodule measuring 1.4 x 1.3 cm, enlarging right upper lobe lung nodule measuring 1.4 x 1.0 cm, enlarging pleural-based left upper lobe lesion measuring 0.9 x 0.7 cm, and paratracheal adenopathy 03/30/2023 PET/CT = hypermetabolic bilateral pulmonary nodules, thoracic adenopathy, right subclavicular adenopathy, left femoral head, and  C6 vertebral body.  Other sites of hypermetabolic activity that might be related to trauma, inflammation or infection: Right quadratus muscle, left lateral eighth -ninth rib, and soft tissue uptake in the posterior right humeral head. 04/04/2024 bronchoscopy/EBUS = subcarinal lymph node = metastatic non-small cell carcinoma fevers adenocarcinoma TMB high, MSS, ER BB 2 (Enhertu or TDM 1), BRCA1 (PARP inhibitors) 05/12/2023 brain MRI = no CNS metastasis 05/14/2023 started first-line CarboTaxol Pembrolizumab (Taxol used instead of pemetrexed to due to low creatinine clearance)    SUBJECTIVE: Ralph Dawson is a 84 y.o. male who presents for follow up.   Ralph Dawson came to today's clinic appointment with his wife. She contributed to today's HPI.  Ralph Dawson reports not doing well.  He continues to have shortness of breath.  His O2 saturation was at 88 when he came into the clinic today.  He has been concerned about the shortness of breath.  He walked in the clinic with a cane but was not able to continue and get to the exam room.  He requested the having a wheelchair.  He denies any fevers.     He went to the emergency room on 04/18/2024. He called over the week reporting progressive shortness of breath.  He was evaluated with a CT scan.  Overall it was stable.  He has a large right pleural effusion.  He was  prescribed azithromycin which he continues.  His wife mentioned that he has 2 more days to go.   ALLERGIES:  Allergies as of 04/21/2024 - Review Complete 04/18/2024  Allergen Reaction Noted   Clopidogrel  Other 03/23/2019   Iodinated diagnostic agents Itching 01/13/2019   Doxycycline Rash 07/01/2021    MEDICATIONS:   Current Outpatient Medications  Medication Sig Dispense Refill   acetaminophen  (TYLENOL  ARTHRITIS,MAPAP) 650 MG CR tablet Take three tablets (1,950 mg dose) by mouth every 8 (eight) hours as needed for Pain.     albuterol  (ACCUNEB ) 1.25 MG/3ML nebulizer solution Take 3 mLs (1.25 mg dose) by nebulization every 6 (six) hours as needed for Wheezing. 720 mL 0   ALPRAZolam (XANAX) 0.5 mg tablet Take 0.5 to 1 tablet three times a day as needed for anxiety. 180 tablet 0   aspirin  (ECOTRIN LOW DOSE) EC tablet Take one tablet (81 mg dose) by mouth every morning.     azithromycin (ZITHROMAX) 250 mg tablet Take one tablet (250 mg dose) by mouth daily for 6 days. Take first 2 tablets today (now), then 1 every day for next 4 days 6 tablet 0   budeson-glycopyrrol-formoterol (BREZTRI AEROSPHERE) 160-9-4.8 MCG/ACT AERO inhaler Inhale 2 puffs into the lungs 2 (two) times daily. 32.1 g 1   colchicine 0.6 mg tablet DAY 1: TAKE 1.2 MG (2 TABLETS) BY MOUTH AT THE FIRST SIGN OF FLARE, FOLLOWED BY 0.6 MG (1 TABLET) AFTER 1 HOUR MAXIMUM TOTAL DOSE: 1.8 MG/DAY (3 TABLETS) ON DAY 1. DAY 2 AND THEREAFTER: TAKE 0.6 MG (1 TABLET) ONCE DAILY UNTIL  FLARE RESOLVES 90 tablet 0   dexamethasone  (DECADRON ) 4 mg tablet Take two tablets (8 mg dose) by mouth daily. Take 2 tablet by mouth once daily for 3 days after Chemotherapy, repeat after each cycle     diphenoxylate-atropine  (LOMOTIL) 2.5-0.025 mg per tablet Take one tablet by mouth 4 (four) times a day as needed for Diarrhea. Max Daily Amount: 4 tablets 60 tablet 1   donepezil  HCl (ARICEPT ) 5 mg tablet TAKE 1 TABLET BY MOUTH EVERYDAY AT BEDTIME 90  tablet 1   DULoxetine  HCl (CYMBALTA ) 60 mg capsule Take one capsule (60 mg dose) by mouth daily. 90 capsule 3   ELIQUIS  2.5 MG tablet TAKE ONE TABLET BY MOUTH 2 TIMES DAILY. 180 tablet 1   ezetimibe  (ZETIA ) 10 MG tablet Take one tablet (10 mg dose) by mouth every evening. 90 tablet 0   famotidine  (PEPCID ) 20 mg tablet Take one tablet (20 mg dose) by mouth every morning.     finasteride (PROSCAR) 5 mg tablet TAKE ONE TABLET BY MOUTH DAILY. 90 tablet 1   furosemide (LASIX) 20 mg tablet TAKE ONE TABLET BY MOUTH DAILY. (Patient taking differently: Take one tablet (20 mg dose) by mouth daily. Taking Every Other Day) 90 tablet 1   HYDROmorphone (DILAUDID) 2 mg tablet Take one tablet (2 mg dose) by mouth every 4 (four) hours as needed for Pain.     lidocaine -prilocaine (EMLA) cream Apply topically as needed. apply topically to portacath site as needed 1-2 hours prior to accessing portacath 30 g 0   loperamide  (IMODIUM ) 2 MG capsule Take one capsule (2 mg dose) by mouth 4 (four) times a day as needed for Diarrhea.     loratadine (CLARITIN) 10 MG tablet Take one tablet (10 mg dose) by mouth every morning.     nebivolol (BYSTOLIC) 10 MG tablet Take one tablet (10 mg dose) by mouth daily.     ondansetron  (ZOFRAN ) 8 MG tablet Take one tablet (8 mg dose) by mouth every 8 (eight) hours as needed for Nausea (or vomiting). 30 tablet 2   prochlorperazine (COMPAZINE) 5 MG tablet Take one tablet (5 mg dose) by mouth every 6 (six) hours as needed for Nausea (or vomiting). 30 tablet 2   tamsulosin  (FLOMAX ) 0.4 mg CAPS TAKE 2 CAPSULES (0.8 MG DOSE) BY MOUTH DAILY 180 capsule 1   No current facility-administered medications for this visit.    REVIEW OF SYSTEMS: As per HPI above  PAST MEDICAL HISTORY:  Past Medical History:  Diagnosis Date   A-fib (*)    Back pain    BPH (benign prostatic hyperplasia)    Claudication of left lower extremity    intermittant   COPD (chronic obstructive  pulmonary disease) (*)    Coronary artery disease    Depression    Dizziness    NEW, SEEN CARDIOLOGIST APPROX. 2 WEEKS AGO NO REPORT PER PT.  02/11/22   Dry mouth    GERD (gastroesophageal reflux disease)    Hyperlipidemia    Hypertension    Memory disorder    SHORT TERM   Mitral regurgitation    mild- per cardiology   Myocardial infarction (*) 06/2016   Seen [facility]   Right bundle branch block    S/P CABG (coronary artery bypass graft) 2019   stents x2   Stage 3 chronic kidney disease (*)    Teeth missing    FULL UPPER AND LOWER PLATES   Tricuspid regurgitation    mild- per cardiology  SURGICAL HISTORY: Past Surgical History:  Procedure Laterality Date   Appendectomy     Back surgery     no hardware   Blepharoplasty Bilateral    Carotid endarterectomy Bilateral    Cataract extraction Bilateral    Cholecystectomy     Colonoscopy     unknown   Coronary angioplasty with stent placement     STENTS X 2.     FAMILY HISTORY:   Family History  Problem Relation Name Age of Onset   Alzheimer's disease Brother     BRCA 1/2 Daughter      Daughter      Niece     Breast cancer Daughter  63    Daughter  53    Niece      Sister  65   COPD Father     Diabetes Brother     Gout Son     No Known Problems Daughter     Pancreatic cancer Sister  71   Stomach cancer Mother  61     SOCIAL HISTORY:  Social History   Tobacco Use   Smoking status: Every Day    Current packs/day: 0.50    Average packs/day: 0.5 packs/day for 74.0 years (37.0 ttl pk-yrs)    Types: Cigarettes    Start date: 1952    Passive exposure: Current   Smokeless tobacco: Former  Substance Use Topics   Alcohol  use: No    PHYSICAL EXAM:    BP 110/71 (BP Location: Right Upper Arm, Patient Position: Sitting)   Pulse 96   Temp 98 F (36.7 C) (Temporal)   Resp 23   Ht 5' 8 (1.727 m)   Wt 147 lb 12.8 oz (67 kg)   SpO2 96% Comment: with 2L of oxygen  BMI 22.47  kg/m   ECOG: 2  GENERAL: well nourished, well hydrated in no acute distress.  HEENT: moist mucus membrane, [place] on board NECK: supple, no masses, trachea midline, no thyromegaly.  RESPIRATORY:  no intercostal retractions or use of accessory muscles. Lungs clear to auscultation bilaterally, no rales, rhonchi, or wheezes.  CARDIOVASCULAR: Regular rate and rhythm,normal S1 and S2, no murmur GASTROINTESTINAL: Abdomen soft, non-tender, non distended, active bowel sounds LYMPHATIC: no cervical or supraclavicular adenopathy.  MUSCULOSKELETAL: No LE edema, no deformities NEURO: sitting in a wheelchair, moves all four extremities, alert and oriented PSYCH: appropriate mood and affect DERM: skin warm and dry, no rash on exposed skin     LABS: Recent Results (from the past 72 hours)  CBC And Differential   Collection Time: 04/21/24  9:59 AM  Result Value Ref Range   WBC 7.9 3.6 - 11.1 thou/mcL   RBC 3.79 (L) 4.27 - 5.49 million/mcL   HGB 11.0 (L) 12.9 - 16.1 gm/dL   HCT 66.0 (L) 62.2 - 53.4 %   MCV 89.4 79.0 - 95.0 fL   MCH 29.0 26.8 - 33.2 pg   MCHC 32.4 (L) 33.5 - 35.5 gm/dL   Plt Ct 743 834 - 646 thou/mcL   RDW SD 44.6 fL   RDW CV 14.7 12.0 - 15.1 %   MPV 8.5 7.5 - 10.7 fL   NEUTROPHIL % 80.9 (H) 43.3-71.9 % %   LYMPHOCYTE % 9.4 (L) 16.8-43.5 % %   MONOCYTE % 8.7 4.5 - 12.4 %   Eosinophil % 0.6 (L) 0.7 - 7.8 %   BASOPHIL % 0.4 0.0 - 1.1 %   ABSOLUTE NEUTROPHIL COUNT 6.36 1.90 - 7.20 thou/mcL   ABSOLUTE  LYMPHOCYTE COUNT 0.74 (L) 1.10 - 2.70 thou/mcL   Absolute Monocyte Count 0.68 0.00 - 0.80 thou/mcL   Absolute Eosinophil Count 0.05 0.00 - 0.50 thou/mcL   Absolute Basophil Count 0.03 0.00 - 0.10 thou/mcL        Recent Results (from the past 72 hours)  Comprehensive Metabolic Panel   Collection Time: 04/21/24  9:59 AM  Result Value Ref Range   Na 134 128 - 145 mmol/L   Potassium 4.6 3.6 - 5.1 mmol/L   Cl 106 98 - 108 mmol/L   CO2 25 18 - 33 mmol/L   AGAP 3 mmol/L    Glucose 136 (H) 73 - 118 mg/dL   BUN 15 7 - 22 mg/dL   Creatinine 8.69 (H) 9.39 - 1.20 mg/dL   Ca 8.7 8.0 - 89.6 mg/dL   ALK PHOS 78 53 - 871 U/L   T Bili 1.1 0.2 - 1.6 mg/dL   Total Protein 6.4 6.4 - 8.1 gm/dL   Alb 3.1 (L) 3.3 - 5.5 gm/dL   GLOBULIN 3.3 gm/dL   ALBUMIN/GLOBULIN RATIO 0.9    BUN/CREAT RATIO 11.5    ALT 22 10 - 47 U/L   AST 27 11 - 38 U/L   eGFR 54 (L) >=59 mL/min/1.92m2        RADIOLOGY:  None recently   PATHOLOGY   04/05/2023 Addendum 2  There is borderline sufficient material for molecular studies. It will be attempted and the results reported separately.  Addendum electronically signed by Aleck DELENA Doing, MD on 04/26/2023 at 1247  Addendum 1  TTF-1 and p40 stains are performed with adequate controls.  Tumor stains positively with TTF-1, providing corroborative evidence for adenocarcinoma and favoring pulmonary primary.  Tumor is negative for staining with p40.  Addendum electronically signed by Alfonse JONELLE Ice, MD on 04/08/2023 at [phone]  Final Diagnosis  Subcarinal Lymph Node; FNA (smears, cell block): Metastatic non-small cell carcinoma, favor adenocarcinoma.     IMPRESSION / PLAN:   Taison Celani is a 84 y.o. male who presents for follow up.   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  He has bilateral lung nodules,  mediastinal adenopathy,  supraclavicular adenopathy, bone lesions involving C6,  left femoral head eighth-ninth rib and right humeral head.   He  underwent a brain MRI that came back negative for CNS metastases.  He also underwent further evaluation with a lumbar spine MRI that came back negative for metastasis in the lumbar spine or surrounding musculature.  I reviewed his [employer] NGS results.  His  PD-L1 CPS and TPS scores were negative.  The tumor is TMB high, MSS, ERBB2 mutation  (Enhertu and TDM 1),  and BRCA1 mutation (PARP inhibitors ).  No other targetable mutations  Due to poor creatinine clearance, he is not eligible to receive pemetrexed.  He started for started first-line treatment with Carboplatinum, Taxol and Pembrolizumab 05/14/2023.   Dose reduced to Carboplatinum ( AUC 3) and Taxol (100mg /m^2) due to poor tolerance prior to cycle 2   He underwent a PET scan on 04/11/2024.  The PET scan came back showing progressive disease.  At that time I recommended  2nd line treatment with Enhertu.  He has in ERBB2 mutation.  We discussed the fact that this will be a better tolerated treatment compared to the combination of Taxotere/ Ramucirumab.  We discussed  the fact that I do not recommend initiating any treatment today.  I reviewed the CT scan that was done when he was in the emergency room.  We discussed the fact that he has had multiple changes that might explain the worsening hypoxemia.  We discussed the fact that pleural effusion is contributing to the shortness of breath.  We discussed the fact that he could have immunotherapy related pneumonitis.  We also discussed the fact that this could be related to his cancer.  We also discussed the fact that this might be an infection.  I recommend starting him on Levaquin.  I asked him to complete the current course of azithromycin.   We discussed the fact that  I also recommend steroids to be tapered off 2 weeks.  We discussed the fact that also recommend a therapeutic thoracentesis.   We discussed the fact that will delay starting treatment by 3 weeks.   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 On 06/04/2023, I increased his Cymbalta  to 60 mg daily.  He  will continue this dose We will monitor   CKD stage IIIB/hypertension:  Today's creatinine was noted to be 1.3 With the CKD stage IIIb, he is not eligible to receive pemetrexed Today's blood pressure was noted to be 110/71 He will make an effort to stay hydrated. 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  Due to worsening CKD he was switched to Xgeva.  He will  continue with this medication every 6 weeks  we will monitor   Hypermetabolic activity in the right quadratus muscle: Further evaluation with an MRI was recommended. The MRI was done 05/12/2023.  It was negative for any lesions in the right quadratus muscle.  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  His wife mentioned that they both have tested for BRCA.  She thinks that it is BRCA1. His niece has also tested positive for BRCA1 and she has had breast cancer ER negative In the past he was told that he was not a candidate for testing.  Per his wife he was told that the testing did not apply to males.  We discussed the fact that information is incorrect. We have discussed the fact that Ralph Chery and  his son are eligible to be tested He mentioned that one of his daughters has declined to be tested. His NGS shows a BRCA1 mutation with the VAF  51%.  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 On 05/28/2023, we discussed the fact that we can consider an official referral to genetics.  He declined.  His wife will talk to their son so he can consider testing.   Geriatric assessment: ECOG: 2 ADLS: Independent IADLS: Independent Cancer and aging research group chemotherapy toxicity score of 16 = 94% risk of developing grade 3-5 toxicity while receiving chemotherapy  Port-a-catheter: The port was placed by IR on  05/07/2023 A prescription for Emla cream has been sent in. He will use it as needed We will monitor  FOLLOW UP: Labs + visit + Tx in 3 weeks  Documentation for time-based billing:  Total time spent of date of service was 40 minutes.  Patient care activities included preparing to see the patient such as reviewing the patient record, obtaining and/or reviewing separately obtained history, performing a medically appropriate history and physical examination, counseling and educating the patient, family, and/or caregiver, ordering prescription medications, tests, or procedures, documenting clinical information in the electronic or other health record, and coordinating the care of the patient when not separately reported.     Disclaimer:  This note was dictated with voice recognition software. Similar sounding words can inadvertently be transcribed and may not be corrected upon review  Orders Placed This Encounter  Procedures   US  Thoracentesis Right   CBC And Differential   Comprehensive Metabolic Panel    Romana RAYMOND Quay, MD 04/21/2024 / 9:10 PM

## 2024-05-12 ENCOUNTER — Inpatient Hospital Stay (HOSPITAL_COMMUNITY)

## 2024-05-12 ENCOUNTER — Inpatient Hospital Stay (HOSPITAL_COMMUNITY): Admission: EM | Admit: 2024-05-12 | Discharge: 2024-05-14 | DRG: 871 | Disposition: E

## 2024-05-12 ENCOUNTER — Emergency Department (HOSPITAL_COMMUNITY)

## 2024-05-12 ENCOUNTER — Encounter (HOSPITAL_COMMUNITY): Payer: Self-pay | Admitting: Emergency Medicine

## 2024-05-12 ENCOUNTER — Other Ambulatory Visit (HOSPITAL_COMMUNITY): Payer: Self-pay | Admitting: *Deleted

## 2024-05-12 DIAGNOSIS — Z881 Allergy status to other antibiotic agents status: Secondary | ICD-10-CM

## 2024-05-12 DIAGNOSIS — J9601 Acute respiratory failure with hypoxia: Secondary | ICD-10-CM | POA: Diagnosis not present

## 2024-05-12 DIAGNOSIS — I4821 Permanent atrial fibrillation: Secondary | ICD-10-CM

## 2024-05-12 DIAGNOSIS — R579 Shock, unspecified: Secondary | ICD-10-CM | POA: Diagnosis not present

## 2024-05-12 DIAGNOSIS — I739 Peripheral vascular disease, unspecified: Secondary | ICD-10-CM | POA: Diagnosis present

## 2024-05-12 DIAGNOSIS — I442 Atrioventricular block, complete: Secondary | ICD-10-CM | POA: Diagnosis not present

## 2024-05-12 DIAGNOSIS — I129 Hypertensive chronic kidney disease with stage 1 through stage 4 chronic kidney disease, or unspecified chronic kidney disease: Secondary | ICD-10-CM | POA: Diagnosis present

## 2024-05-12 DIAGNOSIS — N1832 Chronic kidney disease, stage 3b: Secondary | ICD-10-CM | POA: Diagnosis present

## 2024-05-12 DIAGNOSIS — R6521 Severe sepsis with septic shock: Secondary | ICD-10-CM | POA: Diagnosis present

## 2024-05-12 DIAGNOSIS — I4891 Unspecified atrial fibrillation: Secondary | ICD-10-CM

## 2024-05-12 DIAGNOSIS — I4510 Unspecified right bundle-branch block: Secondary | ICD-10-CM | POA: Diagnosis present

## 2024-05-12 DIAGNOSIS — R001 Bradycardia, unspecified: Secondary | ICD-10-CM | POA: Diagnosis not present

## 2024-05-12 DIAGNOSIS — Z66 Do not resuscitate: Secondary | ICD-10-CM | POA: Diagnosis present

## 2024-05-12 DIAGNOSIS — Z91041 Radiographic dye allergy status: Secondary | ICD-10-CM

## 2024-05-12 DIAGNOSIS — F17210 Nicotine dependence, cigarettes, uncomplicated: Secondary | ICD-10-CM | POA: Diagnosis present

## 2024-05-12 DIAGNOSIS — R570 Cardiogenic shock: Secondary | ICD-10-CM | POA: Diagnosis present

## 2024-05-12 DIAGNOSIS — C3401 Malignant neoplasm of right main bronchus: Secondary | ICD-10-CM | POA: Diagnosis present

## 2024-05-12 DIAGNOSIS — I959 Hypotension, unspecified: Principal | ICD-10-CM | POA: Diagnosis present

## 2024-05-12 DIAGNOSIS — Z7982 Long term (current) use of aspirin: Secondary | ICD-10-CM

## 2024-05-12 DIAGNOSIS — E872 Acidosis, unspecified: Secondary | ICD-10-CM | POA: Diagnosis present

## 2024-05-12 DIAGNOSIS — Z800 Family history of malignant neoplasm of digestive organs: Secondary | ICD-10-CM

## 2024-05-12 DIAGNOSIS — Z1152 Encounter for screening for COVID-19: Secondary | ICD-10-CM

## 2024-05-12 DIAGNOSIS — Z833 Family history of diabetes mellitus: Secondary | ICD-10-CM

## 2024-05-12 DIAGNOSIS — Z79899 Other long term (current) drug therapy: Secondary | ICD-10-CM

## 2024-05-12 DIAGNOSIS — I482 Chronic atrial fibrillation, unspecified: Secondary | ICD-10-CM | POA: Diagnosis present

## 2024-05-12 DIAGNOSIS — N183 Chronic kidney disease, stage 3 unspecified: Secondary | ICD-10-CM | POA: Diagnosis not present

## 2024-05-12 DIAGNOSIS — Z7901 Long term (current) use of anticoagulants: Secondary | ICD-10-CM

## 2024-05-12 DIAGNOSIS — M19071 Primary osteoarthritis, right ankle and foot: Secondary | ICD-10-CM | POA: Diagnosis present

## 2024-05-12 DIAGNOSIS — Z7952 Long term (current) use of systemic steroids: Secondary | ICD-10-CM

## 2024-05-12 DIAGNOSIS — I252 Old myocardial infarction: Secondary | ICD-10-CM

## 2024-05-12 DIAGNOSIS — A419 Sepsis, unspecified organism: Principal | ICD-10-CM | POA: Diagnosis present

## 2024-05-12 DIAGNOSIS — Z515 Encounter for palliative care: Secondary | ICD-10-CM

## 2024-05-12 DIAGNOSIS — Z888 Allergy status to other drugs, medicaments and biological substances status: Secondary | ICD-10-CM

## 2024-05-12 DIAGNOSIS — J9690 Respiratory failure, unspecified, unspecified whether with hypoxia or hypercapnia: Secondary | ICD-10-CM

## 2024-05-12 DIAGNOSIS — C799 Secondary malignant neoplasm of unspecified site: Secondary | ICD-10-CM | POA: Diagnosis present

## 2024-05-12 DIAGNOSIS — Z809 Family history of malignant neoplasm, unspecified: Secondary | ICD-10-CM

## 2024-05-12 DIAGNOSIS — E785 Hyperlipidemia, unspecified: Secondary | ICD-10-CM | POA: Diagnosis present

## 2024-05-12 DIAGNOSIS — I2510 Atherosclerotic heart disease of native coronary artery without angina pectoris: Secondary | ICD-10-CM | POA: Diagnosis present

## 2024-05-12 DIAGNOSIS — M19072 Primary osteoarthritis, left ankle and foot: Secondary | ICD-10-CM | POA: Diagnosis present

## 2024-05-12 DIAGNOSIS — Z955 Presence of coronary angioplasty implant and graft: Secondary | ICD-10-CM

## 2024-05-12 DIAGNOSIS — K219 Gastro-esophageal reflux disease without esophagitis: Secondary | ICD-10-CM | POA: Diagnosis present

## 2024-05-12 LAB — COMPREHENSIVE METABOLIC PANEL WITH GFR
ALT: 34 U/L (ref 0–44)
ALT: 164 U/L — ABNORMAL HIGH (ref 0–44)
AST: 40 U/L (ref 15–41)
AST: 190 U/L — ABNORMAL HIGH (ref 15–41)
Albumin: 3.9 g/dL (ref 3.5–5.0)
Albumin: 3.3 g/dL — ABNORMAL LOW (ref 3.5–5.0)
Alkaline Phosphatase: 127 U/L — ABNORMAL HIGH (ref 38–126)
Alkaline Phosphatase: 145 U/L — ABNORMAL HIGH (ref 38–126)
Anion gap: 19 — ABNORMAL HIGH (ref 5–15)
Anion gap: 13 (ref 5–15)
BUN: 18 mg/dL (ref 8–23)
BUN: 20 mg/dL (ref 8–23)
CO2: 20 mmol/L — ABNORMAL LOW (ref 22–32)
CO2: 18 mmol/L — ABNORMAL LOW (ref 22–32)
Calcium: 8.8 mg/dL — ABNORMAL LOW (ref 8.9–10.3)
Calcium: 7.3 mg/dL — ABNORMAL LOW (ref 8.9–10.3)
Chloride: 100 mmol/L (ref 98–111)
Chloride: 107 mmol/L (ref 98–111)
Creatinine, Ser: 1.41 mg/dL — ABNORMAL HIGH (ref 0.61–1.24)
Creatinine, Ser: 1.41 mg/dL — ABNORMAL HIGH (ref 0.61–1.24)
GFR, Estimated: 49 mL/min — ABNORMAL LOW
GFR, Estimated: 49 mL/min — ABNORMAL LOW
Glucose, Bld: 222 mg/dL — ABNORMAL HIGH (ref 70–99)
Glucose, Bld: 127 mg/dL — ABNORMAL HIGH (ref 70–99)
Potassium: 4.4 mmol/L (ref 3.5–5.1)
Potassium: 5.2 mmol/L — ABNORMAL HIGH (ref 3.5–5.1)
Sodium: 139 mmol/L (ref 135–145)
Sodium: 137 mmol/L (ref 135–145)
Total Bilirubin: 1.3 mg/dL — ABNORMAL HIGH (ref 0.0–1.2)
Total Bilirubin: 1.1 mg/dL (ref 0.0–1.2)
Total Protein: 6.5 g/dL (ref 6.5–8.1)
Total Protein: 5.8 g/dL — ABNORMAL LOW (ref 6.5–8.1)

## 2024-05-12 LAB — URINALYSIS, W/ REFLEX TO CULTURE (INFECTION SUSPECTED)
Bilirubin Urine: NEGATIVE
Glucose, UA: 150 mg/dL — AB
Hgb urine dipstick: NEGATIVE
Ketones, ur: NEGATIVE mg/dL
Leukocytes,Ua: NEGATIVE
Nitrite: NEGATIVE
Protein, ur: 100 mg/dL — AB
Specific Gravity, Urine: 1.009 (ref 1.005–1.030)
pH: 7 (ref 5.0–8.0)

## 2024-05-12 LAB — BLOOD GAS, ARTERIAL
Acid-base deficit: 14.8 mmol/L — ABNORMAL HIGH (ref 0.0–2.0)
Bicarbonate: 12.8 mmol/L — ABNORMAL LOW (ref 20.0–28.0)
Drawn by: 22179
O2 Saturation: 100 %
Patient temperature: 35
pCO2 arterial: 32 mmHg (ref 32–48)
pH, Arterial: 7.2 — ABNORMAL LOW (ref 7.35–7.45)
pO2, Arterial: 149 mmHg — ABNORMAL HIGH (ref 83–108)

## 2024-05-12 LAB — RESP PANEL BY RT-PCR (RSV, FLU A&B, COVID)  RVPGX2
Influenza A by PCR: NEGATIVE
Influenza B by PCR: NEGATIVE
Resp Syncytial Virus by PCR: NEGATIVE
SARS Coronavirus 2 by RT PCR: NEGATIVE

## 2024-05-12 LAB — PROTIME-INR
INR: 1.3 — ABNORMAL HIGH (ref 0.8–1.2)
Prothrombin Time: 17.3 s — ABNORMAL HIGH (ref 11.4–15.2)

## 2024-05-12 LAB — CBC WITH DIFFERENTIAL/PLATELET
Abs Immature Granulocytes: 0.13 10*3/uL — ABNORMAL HIGH (ref 0.00–0.07)
Basophils Absolute: 0 10*3/uL (ref 0.0–0.1)
Basophils Relative: 0 %
Eosinophils Absolute: 0 10*3/uL (ref 0.0–0.5)
Eosinophils Relative: 0 %
HCT: 41.8 % (ref 39.0–52.0)
Hemoglobin: 13 g/dL (ref 13.0–17.0)
Immature Granulocytes: 1 %
Lymphocytes Relative: 16 %
Lymphs Abs: 2.6 10*3/uL (ref 0.7–4.0)
MCH: 29.3 pg (ref 26.0–34.0)
MCHC: 31.1 g/dL (ref 30.0–36.0)
MCV: 94.1 fL (ref 80.0–100.0)
Monocytes Absolute: 1.1 10*3/uL — ABNORMAL HIGH (ref 0.1–1.0)
Monocytes Relative: 7 %
Neutro Abs: 11.9 10*3/uL — ABNORMAL HIGH (ref 1.7–7.7)
Neutrophils Relative %: 76 %
Platelets: 288 10*3/uL (ref 150–400)
RBC: 4.44 MIL/uL (ref 4.22–5.81)
RDW: 15.3 % (ref 11.5–15.5)
WBC: 15.8 10*3/uL — ABNORMAL HIGH (ref 4.0–10.5)
nRBC: 0 % (ref 0.0–0.2)

## 2024-05-12 LAB — LACTIC ACID, PLASMA
Lactic Acid, Venous: 7.9 mmol/L (ref 0.5–1.9)
Lactic Acid, Venous: 6.8 mmol/L (ref 0.5–1.9)
Lactic Acid, Venous: 2.7 mmol/L (ref 0.5–1.9)

## 2024-05-12 LAB — TROPONIN T, HIGH SENSITIVITY
Troponin T High Sensitivity: 42 ng/L — ABNORMAL HIGH (ref 0–19)
Troponin T High Sensitivity: 36 ng/L — ABNORMAL HIGH (ref 0–19)

## 2024-05-12 LAB — GLUCOSE, CAPILLARY: Glucose-Capillary: 120 mg/dL — ABNORMAL HIGH (ref 70–99)

## 2024-05-12 LAB — PRO BRAIN NATRIURETIC PEPTIDE: Pro Brain Natriuretic Peptide: 5262 pg/mL — ABNORMAL HIGH

## 2024-05-12 LAB — MAGNESIUM: Magnesium: 1.8 mg/dL (ref 1.7–2.4)

## 2024-05-12 LAB — T4, FREE: Free T4: 1.44 ng/dL (ref 0.80–2.00)

## 2024-05-12 LAB — TSH: TSH: 6.08 u[IU]/mL — ABNORMAL HIGH (ref 0.350–4.500)

## 2024-05-12 MED ORDER — POLYETHYLENE GLYCOL 3350 17 G PO PACK: 17.0000 g | PACK | Freq: Every day | ORAL | Status: DC | PRN

## 2024-05-12 MED ORDER — POLYVINYL ALCOHOL 1.4 % OP SOLN: 1.0000 [drp] | Freq: Four times a day (QID) | OPHTHALMIC | Status: DC | PRN

## 2024-05-12 MED ORDER — FENTANYL CITRATE (PF) 100 MCG/2ML IJ SOLN
INTRAMUSCULAR | Status: AC
  Administered 2024-05-12: 100 ug via INTRAVENOUS
  Filled 2024-05-12: qty 2

## 2024-05-12 MED ORDER — DOPAMINE-DEXTROSE 3.2-5 MG/ML-% IV SOLN
0.0000 ug/kg/min | INTRAVENOUS | Status: DC
  Administered 2024-05-12: 5 ug/kg/min via INTRAVENOUS
  Filled 2024-05-12: qty 250

## 2024-05-12 MED ORDER — SODIUM CHLORIDE 0.9 % IV BOLUS
500.0000 mL | Freq: Once | INTRAVENOUS | Status: AC
  Administered 2024-05-12: 500 mL via INTRAVENOUS

## 2024-05-12 MED ORDER — LACTATED RINGERS IV SOLN: INTRAVENOUS | Status: DC

## 2024-05-12 MED ORDER — FAMOTIDINE 20 MG PO TABS: 20.0000 mg | ORAL_TABLET | Freq: Two times a day (BID) | ORAL | Status: DC

## 2024-05-12 MED ORDER — METRONIDAZOLE 500 MG/100ML IV SOLN
500.0000 mg | Freq: Once | INTRAVENOUS | Status: AC
  Administered 2024-05-12: 500 mg via INTRAVENOUS
  Filled 2024-05-12: qty 100

## 2024-05-12 MED ORDER — ATROPINE SULFATE 1 MG/10ML IJ SOSY
1.0000 mg | PREFILLED_SYRINGE | Freq: Once | INTRAMUSCULAR | Status: AC
  Administered 2024-05-12: 1 mg via INTRAVENOUS
  Filled 2024-05-12: qty 10

## 2024-05-12 MED ORDER — SODIUM CHLORIDE 0.9 % IV SOLN
2.0000 g | Freq: Once | INTRAVENOUS | Status: AC
  Administered 2024-05-12: 2 g via INTRAVENOUS
  Filled 2024-05-12: qty 12.5

## 2024-05-12 MED ORDER — MORPHINE BOLUS VIA INFUSION
5.0000 mg | INTRAVENOUS | Status: DC | PRN
  Administered 2024-05-12: 5 mg via INTRAVENOUS

## 2024-05-12 MED ORDER — ACETAMINOPHEN 325 MG PO TABS: 650.0000 mg | ORAL_TABLET | Freq: Four times a day (QID) | ORAL | Status: DC | PRN

## 2024-05-12 MED ORDER — ATROPINE SULFATE 1 MG/10ML IJ SOSY
PREFILLED_SYRINGE | INTRAMUSCULAR | Status: AC
  Administered 2024-05-12: 1 mg via INTRAVENOUS
  Filled 2024-05-12: qty 10

## 2024-05-12 MED ORDER — SODIUM CHLORIDE 0.9 % IV BOLUS
1000.0000 mL | Freq: Once | INTRAVENOUS | Status: AC
  Administered 2024-05-12: 1000 mL via INTRAVENOUS

## 2024-05-12 MED ORDER — MIDAZOLAM HCL (PF) 2 MG/2ML IJ SOLN: 2.0000 mg | INTRAMUSCULAR | Status: DC | PRN

## 2024-05-12 MED ORDER — SODIUM CHLORIDE 0.9 % IV SOLN: INTRAVENOUS | Status: DC

## 2024-05-12 MED ORDER — PIPERACILLIN-TAZOBACTAM 3.375 G IVPB
3.3750 g | Freq: Three times a day (TID) | INTRAVENOUS | Status: DC
  Filled 2024-05-12: qty 50

## 2024-05-12 MED ORDER — SODIUM BICARBONATE 8.4 % IV SOLN
50.0000 meq | Freq: Once | INTRAVENOUS | Status: AC
  Administered 2024-05-12: 50 meq via INTRAVENOUS

## 2024-05-12 MED ORDER — ACETAMINOPHEN 650 MG RE SUPP: 650.0000 mg | Freq: Four times a day (QID) | RECTAL | Status: DC | PRN

## 2024-05-12 MED ORDER — HEPARIN SODIUM (PORCINE) 5000 UNIT/ML IJ SOLN
5000.0000 [IU] | Freq: Three times a day (TID) | INTRAMUSCULAR | Status: DC
  Filled 2024-05-12: qty 1

## 2024-05-12 MED ORDER — MORPHINE 100MG IN NS 100ML (1MG/ML) PREMIX INFUSION
0.0000 mg/h | INTRAVENOUS | Status: DC
  Administered 2024-05-12: 5 mg/h via INTRAVENOUS
  Filled 2024-05-12: qty 100

## 2024-05-12 MED ORDER — CHLORHEXIDINE GLUCONATE CLOTH 2 % EX PADS
6.0000 | MEDICATED_PAD | Freq: Every day | CUTANEOUS | Status: DC
  Administered 2024-05-12: 6 via TOPICAL

## 2024-05-12 MED ORDER — VANCOMYCIN HCL IN DEXTROSE 1-5 GM/200ML-% IV SOLN: 1000.0000 mg | Freq: Once | INTRAVENOUS | Status: DC

## 2024-05-12 MED ORDER — PROPOFOL 1000 MG/100ML IV EMUL
0.0000 ug/kg/min | INTRAVENOUS | Status: DC
  Administered 2024-05-12: 10 ug/kg/min via INTRAVENOUS
  Filled 2024-05-12: qty 100

## 2024-05-12 MED ORDER — VANCOMYCIN HCL 1500 MG/300ML IV SOLN
1500.0000 mg | Freq: Once | INTRAVENOUS | Status: AC
  Administered 2024-05-12: 1500 mg via INTRAVENOUS
  Filled 2024-05-12: qty 300

## 2024-05-12 MED ORDER — VANCOMYCIN HCL 1500 MG/300ML IV SOLN: 1500.0000 mg | INTRAVENOUS | Status: DC

## 2024-05-12 MED ORDER — SUCCINYLCHOLINE CHLORIDE 200 MG/10ML IV SOSY
120.0000 mg | PREFILLED_SYRINGE | Freq: Once | INTRAVENOUS | Status: AC
  Administered 2024-05-12: 120 mg via INTRAVENOUS
  Filled 2024-05-12: qty 10

## 2024-05-12 MED ORDER — NOREPINEPHRINE 4 MG/250ML-% IV SOLN
0.0000 ug/min | INTRAVENOUS | Status: DC
  Administered 2024-05-12: 5 ug/min via INTRAVENOUS
  Administered 2024-05-12: 12 ug/min via INTRAVENOUS
  Filled 2024-05-12 (×2): qty 250

## 2024-05-12 MED ORDER — POLYETHYLENE GLYCOL 3350 17 G PO PACK: 17.0000 g | PACK | Freq: Every day | ORAL | Status: DC

## 2024-05-12 MED ORDER — MIDAZOLAM HCL 5 MG/5ML IJ SOLN
5.0000 mg | Freq: Once | INTRAMUSCULAR | Status: AC
  Administered 2024-05-12: 5 mg via INTRAVENOUS
  Filled 2024-05-12: qty 5

## 2024-05-12 MED ORDER — GLYCOPYRROLATE 0.2 MG/ML IJ SOLN: 0.2000 mg | INTRAMUSCULAR | Status: DC | PRN

## 2024-05-12 MED ORDER — SODIUM BICARBONATE 8.4 % IV SOLN
INTRAVENOUS | Status: AC
  Filled 2024-05-12: qty 50

## 2024-05-12 MED ORDER — FENTANYL CITRATE (PF) 100 MCG/2ML IJ SOLN: 100.0000 ug | Freq: Once | INTRAMUSCULAR | Status: AC

## 2024-05-12 MED ORDER — PANTOPRAZOLE SODIUM 40 MG IV SOLR: 40.0000 mg | Freq: Every day | INTRAVENOUS | Status: DC

## 2024-05-12 MED ORDER — FENTANYL CITRATE (PF) 50 MCG/ML IJ SOSY: 50.0000 ug | PREFILLED_SYRINGE | INTRAMUSCULAR | Status: DC | PRN

## 2024-05-12 MED ORDER — ETOMIDATE 2 MG/ML IV SOLN
20.0000 mg | Freq: Once | INTRAVENOUS | Status: AC
  Administered 2024-05-12: 20 mg via INTRAVENOUS
  Filled 2024-05-12: qty 10

## 2024-05-12 MED ORDER — SENNA 8.6 MG PO TABS: 1.0000 | ORAL_TABLET | Freq: Two times a day (BID) | ORAL | Status: DC | PRN

## 2024-05-12 MED ORDER — SENNA 8.6 MG PO TABS: 1.0000 | ORAL_TABLET | Freq: Two times a day (BID) | ORAL | Status: DC

## 2024-05-12 MED ORDER — GLYCOPYRROLATE 1 MG PO TABS: 1.0000 mg | ORAL_TABLET | ORAL | Status: DC | PRN

## 2024-05-12 MED ORDER — ATROPINE SULFATE 1 MG/10ML IJ SOSY
1.0000 mg | PREFILLED_SYRINGE | Freq: Once | INTRAMUSCULAR | Status: AC
  Administered 2024-05-12: 1 mg via INTRAVENOUS

## 2024-05-12 MED ORDER — ONDANSETRON HCL 4 MG/2ML IJ SOLN: 4.0000 mg | Freq: Four times a day (QID) | INTRAMUSCULAR | Status: DC | PRN

## 2024-05-12 MED ORDER — EPINEPHRINE HCL 5 MG/250ML IV SOLN IN NS
0.5000 ug/min | INTRAVENOUS | Status: DC
  Administered 2024-05-12: 0.5 ug/min via INTRAVENOUS
  Filled 2024-05-12: qty 250

## 2024-05-12 MED ORDER — FENTANYL CITRATE (PF) 100 MCG/2ML IJ SOLN
100.0000 ug | Freq: Once | INTRAMUSCULAR | Status: AC
  Administered 2024-05-12: 100 ug via INTRAVENOUS
  Filled 2024-05-12: qty 2

## 2024-05-12 MED ORDER — ACETAMINOPHEN 160 MG/5ML PO SOLN: 650.0000 mg | ORAL | Status: DC | PRN

## 2024-05-12 MED ORDER — ATROPINE SULFATE 1 MG/ML IV SOLN
INTRAVENOUS | Status: AC
  Filled 2024-05-12: qty 1

## 2024-05-14 NOTE — ED Notes (Signed)
 Mechanical and electrical capture lost with TCP. Zoll reset and TCP restarted at 60ma and 60ppm. Electrical capture at 62ma and mechanical capture at 68ma. Mechanical capture confirmed with doppler on left femoral artery.

## 2024-05-14 NOTE — ED Notes (Signed)
 Wife given pt belongings bag.

## 2024-05-14 NOTE — ED Notes (Signed)
 Dr. Alvan at bedside talking to wife.

## 2024-05-14 NOTE — ED Notes (Signed)
 Pt placed on bipap

## 2024-05-14 NOTE — ED Notes (Signed)
 Pacing applied to pt per verbal order.

## 2024-05-14 NOTE — Progress Notes (Signed)
 Pt arrived to ICU, intubated, on propofol . Not responsive at this time Switched to Zoll pacing. Per [facility] patient requiring 90mA to capture during transport. Attempted to resume pacing at previous mA but patient not capturing.  Settings: mA 90, PPM 60. NP at bedside.

## 2024-05-14 NOTE — ED Notes (Signed)
 Patient transported to CT

## 2024-05-14 NOTE — ED Notes (Signed)
 Pt intubated  7.5 et tube 26 @ the lip

## 2024-05-14 NOTE — ED Notes (Signed)
 Pacing captured @ & 60 BPM

## 2024-05-14 NOTE — ED Notes (Signed)
 Pt has abrasions to left ear, left hand and c/o head and neck, back pain.

## 2024-05-14 NOTE — ED Notes (Signed)
"  Xray done  "

## 2024-05-14 NOTE — Sepsis Progress Note (Signed)
 Elink following code sepsis

## 2024-05-14 NOTE — IPAL (Signed)
" °  Interdisciplinary Goals of Care Family Meeting   Date carried out: 05-28-24  Location of the meeting: Bedside  Member's involved: Physician, Nurse Practitioner, Bedside Registered Nurse, and Family Member or next of kin  Durable Power of Attorney or acting medical decision maker: wife Nathanel     Discussion: We discussed goals of care for Mckesson . We discussed his worsening chronic conditions and the state of his current critical illness. PCCM and cardiology have communicated concerns that this is may not be reversible. I shared my concern that he appears to be at the end of life.   We discussed goals of care and a consensus was reached to transition to comfort care. We discussed what this entails, and the medications used.  When he is comfortable, he will be extubated when family is ready. Following extubation, TCP and pressors will be turned off.  We discussed that he will die in the ICU and his life expectancy following extubation is likely minutes-hours.   All questions answered   Code status:   Code Status: Do not attempt resuscitation (DNR) - Comfort care   Disposition: In-patient comfort care  Time spent for the meeting:    Ronnald FORBES Gave, NP  2024-05-28, 4:15 PM   "

## 2024-05-14 NOTE — ED Notes (Signed)
 Atropine  1mg  given 0734  Atropine  1 mg given 0748  All verbal orders.

## 2024-05-14 NOTE — Death Summary Note (Signed)
 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 In ED he was bradycardic with HR 20-30s, hypothermic and hypotensive. He was given atropine  multiple times. Resp status declined req BiPAP and then intubation. Following intubation was started on TCP, dopamine  and levo      Cardiology was consulted PCCM requested to admit pt to [facility] Course:  84 year old male presented as a transfer for hypoxic respiratory failure and shortness of breath.  He was found to be bradycardic and hypotensive.   he was treated with broad-spectrum antibiotics, placed on transcutaneous pacing, intubated, placed on Levophed  and transferred to [facility] ICU from [facility], ER.  After the patient presented cardiology saw the patient at [facility].  It was determined that bradycardia was cardiac in nature and not metabolic.  He was not a candidate for permanent pacemaker.  Goals of care discussion was done and the patient was made comfort measures only.  He passed on 2024/06/03 at 1810.    Procedures and Results: None.    Consultations: Cardiology.   The results of significant diagnostics from this hospitalization (including imaging, microbiology, ancillary and laboratory) are listed below for reference.    Significant Diagnostic Studies: CT Cervical Spine Wo Contrast Result Date: 03-Jun-2024 EXAM: CT CERVICAL SPINE WITHOUT CONTRAST 2024/06/03 12:01:11 PM  TECHNIQUE: CT of the cervical spine was performed without the administration of intravenous contrast. Multiplanar reformatted images are provided for review. Automated exposure control, iterative reconstruction, and/or weight based adjustment of the mA/kV was utilized to reduce the radiation dose to as low as reasonably achievable. COMPARISON: None available. CLINICAL HISTORY: Neck trauma, intoxicated or obtunded (Age >= 16y). Neck trauma; intoxicated or obtunded; age >= 16 years. FINDINGS: BONES AND ALIGNMENT: Trace degenerative anterolisthesis of C6 on C7 and C7 on T1. Mild extracurvature of the cervical spine. No evidence of traumatic malalignment. Indeterminate lucent lesion within the right aspect of the C6 vertebral body. Recommend a nonemergent MRI of the cervical spine with and without contrast for further evaluation. No high grade osseous spinal canal stenosis. DEGENERATIVE CHANGES: Facet arthrosis and uncovertebral hypertrophy at multiple levels. Foraminal stenosis most pronounced on the left at C5-C6 and on the right at C6-C7. SOFT TISSUES: No prevertebral soft tissue swelling. Clearly visualized endotracheal tube and enteric tube. LUNGS: Emphysema in the left lung apex. The right lung apex is incompletely visualized. IMPRESSION: 1. No evidence of acute traumatic injury. 2. Indeterminate lucent lesion within the right aspect of the C6 vertebral body. Recommend a nonemergent MRI of the cervical spine with and without contrast for further evaluation. Electronically signed by: Donnice Mania MD 06/03/24 12:10 PM EST RP Workstation: [id number]   CT Head Wo Contrast Result Date: 06/03/24 EXAM: CT HEAD WITHOUT CONTRAST 06/03/24 12:01:11 PM TECHNIQUE: CT of the head was performed without the administration of intravenous contrast.  Automated exposure control, iterative reconstruction, and/or weight based adjustment of the mA/kV was utilized to reduce the radiation dose to as low as reasonably achievable.  COMPARISON: None available. CLINICAL HISTORY: Head trauma, moderate-severe. FINDINGS: BRAIN AND VENTRICLES: No acute hemorrhage. No evidence of acute infarct. Periventricular white matter changes from chronic small vessel ischemic disease. Age-related cerebral atrophy with ex vacuo ventricular dilatation. 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 Atherosclerotic calcifications in intracranial carotid and vertebral arteries. No extra-axial collection. No mass effect or midline shift. ORBITS: No acute abnormality. Status post bilateral lens replacements. SINUSES: No acute abnormality. SOFT TISSUES AND SKULL: No acute soft tissue abnormality. No skull fracture. IMPRESSION: 1. No acute intracranial abnormality related to head trauma. Electronically signed by: Donnice Mania MD 05-21-2024 12:06 PM EST RP Workstation: [id number]   DG Chest Port 1 View Result Date: 2024/05/21 EXAM: 1 VIEW(S) XRAY OF THE CHEST 2024-05-21 09:51:00 AM COMPARISON: 05/21/24 CLINICAL HISTORY: Orogastric tube placement. ICD [id number] Encounter for orogastric tube placement. FINDINGS: LINES, TUBES AND DEVICES: Enteric tube in place with tip in the stomach. Endotracheal tube in place with tip 2.4 cm above carina. Right central venous catheter in place with tip over the superior cavoatrial junction. LUNGS AND PLEURA: Unchanged appearance of right mid lung perihilar mass-like opacity. Mild interstitial edema is unchanged from the previous exam. Small right pleural effusion. No pneumothorax. The perihilar mass-like opacity may represent a neoplasm, chronic inflammatory/infectious process, or lymphadenopathy. Clinical correlation and further imaging, such as a CT chest, are recommended for better characterization of the mass-like opacity and evaluation of the pleural effusion. HEART AND MEDIASTINUM: No acute abnormality of the cardiac and mediastinal silhouettes. BONES AND SOFT TISSUES: No acute osseous abnormality. IMPRESSION:  1. Enteric (orogastric) tube terminates in the stomach. 2. Endotracheal tube tip projects 2.4 cm above the carina, and right central venous catheter tip projects over the superior cavoatrial junction. 3.  Unchanged mild interstitial edema and small right pleural effusion. 4. Similar appearance of mass-like opacity within the perihilar right mid lung. Electronically signed by: Waddell Calk MD 05-21-24 10:09 AM EST RP Workstation: [id number]   DG Chest Port 1 View Result Date: 05/21/2024 EXAM: 1 VIEW(S) XRAY OF THE CHEST 05/21/24 07:37:00 AM COMPARISON: 01/02/2022. CLINICAL HISTORY: Shortness of breath. FINDINGS: LINES, TUBES AND DEVICES: Right Port-A-Cath in place with tip at the cavoatrial junction. LUNGS AND PLEURA: Small right pleural effusion. Prominent interstitial opacities, may represent pulmonary edema or pneumonia. There is a new right hilar mass. No pneumothorax. HEART AND MEDIASTINUM: Cardiomegaly. Calcified aorta. BONES AND SOFT TISSUES: No acute osseous abnormality. IMPRESSION: 1. New right hilar mass. Differential diagnosis includes primary lung malignancy, metastatic disease, or hilar lymphadenopathy (including lymphoma or granulomatous infection). Recommend contrast-enhanced CT chest for further evaluation. 2. Prominent interstitial opacities, possibly representing pulmonary edema or or atypical infection. 3. Small right pleural effusion. 4. Cardiomegaly. Electronically signed by: Waddell Calk MD 2024/05/21 07:45 AM EST RP Workstation: [id number]    Microbiology: Recent Results (from the past 240 hours)  Blood Culture (routine x 2)     Status: None (Preliminary result)   Collection Time: 2024/05/21  8:39 AM   Specimen: BLOOD LEFT HAND  Result Value Ref Range Status   Specimen Description BLOOD LEFT HAND AEROBIC BOTTLE ONLY  Final   Special Requests Blood Culture adequate volume  Final   Culture   Final    NO GROWTH < 12 HOURS Performed at [facility], [address],  [place], [place] [address]  Report Status PENDING  Incomplete  Resp panel by RT-PCR (RSV, Flu A&B, Covid) Anterior Nasal Swab     Status: None   Collection Time: 06-05-24  9:11 AM   Specimen: Anterior Nasal Swab  Result Value Ref Range Status   SARS Coronavirus 2 by RT PCR NEGATIVE NEGATIVE Final    Comment: (NOTE) SARS-CoV-2 target nucleic acids are NOT DETECTED.  The SARS-CoV-2 RNA is generally detectable in upper respiratory specimens during the acute phase of infection. The lowest concentration of SARS-CoV-2 viral copies this assay can detect is 138 copies/mL. A negative result does not preclude SARS-Cov-2 infection and should not be used as the sole basis for treatment or other patient management decisions. A negative result may occur with  improper specimen collection/handling, submission of specimen other than nasopharyngeal swab, presence of viral mutation(s) within the areas targeted by this assay, and inadequate number of viral copies(<138 copies/mL). A negative result must be combined with clinical observations, patient history, and epidemiological information. The expected result is Negative.  Fact Sheet for Patients:  bloggercourse.com  Fact Sheet for Healthcare Providers:  seriousbroker.it  This test is no t yet approved or cleared by the [place]  FDA and  has been authorized for detection and/or diagnosis of SARS-CoV-2 by FDA under an Emergency Use Authorization (EUA). This EUA will remain  in effect (meaning this test can be used) for the duration of the COVID-19 declaration under Section 564(b)(1) of the Act, 21 U.S.C.section 360bbb-3(b)(1), unless the authorization is terminated  or revoked sooner.       Influenza A by PCR NEGATIVE NEGATIVE Final   Influenza B by PCR NEGATIVE NEGATIVE Final    Comment: (NOTE) The Xpert Xpress SARS-CoV-2/FLU/RSV plus assay is intended as an aid in the diagnosis of influenza  from Nasopharyngeal swab specimens and should not be used as a sole basis for treatment. Nasal washings and aspirates are unacceptable for Xpert Xpress SARS-CoV-2/FLU/RSV testing.  Fact Sheet for Patients: bloggercourse.com  Fact Sheet for Healthcare Providers: seriousbroker.it  This test is not yet approved or cleared by the [place]  FDA and has been authorized for detection and/or diagnosis of SARS-CoV-2 by FDA under an Emergency Use Authorization (EUA). This EUA will remain in effect (meaning this test can be used) for the duration of the COVID-19 declaration under Section 564(b)(1) of the Act, 21 U.S.C. section 360bbb-3(b)(1), unless the authorization is terminated or revoked.     Resp Syncytial Virus by PCR NEGATIVE NEGATIVE Final    Comment: (NOTE) Fact Sheet for Patients: bloggercourse.com  Fact Sheet for Healthcare Providers: seriousbroker.it  This test is not yet approved or cleared by the [place]  FDA and has been authorized for detection and/or diagnosis of SARS-CoV-2 by FDA under an Emergency Use Authorization (EUA). This EUA will remain in effect (meaning this test can be used) for the duration of the COVID-19 declaration under Section 564(b)(1) of the Act, 21 U.S.C. section 360bbb-3(b)(1), unless the authorization is terminated or revoked.  Performed at [facility], [address] [place], [place] [address]   Blood Culture (routine x 2)     Status: None (Preliminary result)   Collection Time: Jun 05, 2024  9:32 AM   Specimen: BLOOD  Result Value Ref Range Status   Specimen Description BLOOD LEFT ANTECUBITAL  Final   Special Requests   Final    BOTTLES DRAWN AEROBIC ONLY Blood Culture adequate volume   Culture   Final    NO GROWTH < 12 HOURS Performed at [facility], [address], [place], [place] [address]    Report Status PENDING  Incomplete      Labs: CBC: Recent Labs  Lab 05/27/24 0730  WBC 15.8*  NEUTROABS 11.9*  HGB 13.0  HCT 41.8  MCV 94.1  PLT 288   Basic Metabolic Panel: Recent Labs  Lab May 27, 2024 0730 2024/05/27 1556  NA 139 137  K 4.4 5.2*  CL 100 107  CO2 20* 18*  GLUCOSE 222* 127*  BUN 18 20  CREATININE 1.41* 1.41*  CALCIUM  8.8* 7.3*  MG  --  1.8   Liver Function Tests: Recent Labs  Lab May 27, 2024 0730 May 27, 2024 1556  AST 40 190*  ALT 34 164*  ALKPHOS 127* 145*  BILITOT 1.3* 1.1  PROT 6.5 5.8*  ALBUMIN 3.9 3.3*   Cardiac Enzymes: No results for input(s): CKTOTAL, CKMB, CKMBINDEX, TROPONINI in the last 168 hours.  Time spent: 32 minutes  Signed:  Sammi Fredericks  PCCM attending.      "

## 2024-05-14 NOTE — ED Notes (Signed)
 Blood cultures just collected by lab. Unable to start code sepsis antibitoics due to patients detearing decline. Pt HR 17-20 bpm w/ pressure of 50s systolic. Edp remains at bedside

## 2024-05-14 NOTE — ED Notes (Signed)
 Date and time results received: 2024-05-28 0904 (use smartphrase .now to insert current time)  Test: lactic acid Critical Value: 7.9  Name of Provider Notified: [email or web address]  Orders Received? Or Actions Taken?: Orders Received - See Orders for details

## 2024-05-14 NOTE — Progress Notes (Signed)
 Pharmacy Antibiotic Note  Ralph Dawson is a 84 y.o. male admitted on 2024-06-05 with sepsis.  Pharmacy has been consulted for vancomycin  and zosyn  dosing.  Plan: Zosyn  3.375gm IV q8h (4hr extended infusions) Vancomycin  1500mg  IV q48h for estimated AUC 416 using SCr 1.41 Check vancomycin  levels at steady state, goal AUC 400-550 Follow up renal function, cultures as available, clinical progress, length of tx  Height: 5' 8 (172.7 cm) Weight: 70 kg (154 lb 5.2 oz) IBW/kg (Calculated) : 68.4  Temp (24hrs), Avg:96.6 F (35.9 C), Min:94.5 F (34.7 C), Max:99.1 F (37.3 C)  Recent Labs  Lab June 05, 2024 0730 Jun 05, 2024 0839 06/05/2024 0932  WBC 15.8*  --   --   CREATININE 1.41*  --   --   LATICACIDVEN  --  7.9* 6.8*    Estimated Creatinine Clearance: 37.7 mL/min (A) (by C-G formula based on SCr of 1.41 mg/dL (H)).    Allergies[1]  Antimicrobials this admission: Cefepime /Flagyl  x 1 06/05/2024 Vancomycin  06-05-24 >> Zosyn  2024/06/05 >>  Dose adjustments this admission:  Microbiology results: 06-05-24 BCx: ngtd 06-05-24 MRSA PCR: June 05, 2024 RVP:  Thank you for allowing pharmacy to be a part of this patients care.  Rocky Slade, PharmD, BCPS June 05, 2024 3:19 PM     [1]  Allergies Allergen Reactions   Plavix  [Clopidogrel ]     Causes severe pain in hands with dizziness.    Contrast Media [Iodinated Contrast Media] Itching    Caused itching. Possibly caused SVT vs afib post-op   Doxycycline Rash

## 2024-05-14 NOTE — ED Notes (Addendum)
 Pt HR remains in the 20s after 3mg  of atropine . BP is 70s systolic. EDP made aware.

## 2024-05-14 NOTE — ED Notes (Signed)
 DNR given to [facility].

## 2024-05-14 NOTE — ED Triage Notes (Signed)
 Pt bib [facility] from home for sob & chest pain that started this morning. Pt sats found to be low 80s. Pt given a NRB and then 3 albuterol  txs. Sats were up to 90. Pt also fell this morning hitting left side of head. Pt c/o of lower back pain and neck pain. Pt refused ccollar w/ ems.

## 2024-05-14 NOTE — Consult Note (Addendum)
 "  Cardiology Consultation   Patient ID: Ralph Dawson MRN: [id number]; DOB: 27-Jan-1941  Admit date: 17-May-2024 Date of Consult: 05-17-2024  PCP:  System, Provider Not In   [facility] Providers Cardiologist:  Debby Sor, MD (Inactive)  Cardiology APP:  Madie Jon Garre, [place]       Patient Profile: Ralph Dawson is a 84 y.o. male with a hx of history of PAF, carotid stenosis with left CEA in 2009 and right CEA in 2011, HTN, HLD, PAD prior arthrectomy/PTA left SFA , CAD with DES to LCX in 2019  who is being seen May 17, 2024 for the evaluation of bradycardia at the request of Dr Suzette.  History of Present Illness:   84 yo male history of PAF, carotid stenosis with left CEA in 2009 and right CEA in 2011, HTN, HLD, PAD prior arthrectomy/PTA left SFA , CAD with DES to LCX in 2019 presented with SOB and chest pain. Sats in 80s, started on NRB in ER     WBC 15.8 Plt 288 Hgb 13 K 4.4 Cr 1.41 BUN 18 Lactic acid 7.9-->6.8 TSH 6 ABG 7.2/32/149/13 Trop 42-->36 EKG slow afib 30s, no acute ischemic changes CXR right hilar mass   Past Medical History:  Diagnosis Date   Arthritis    hands, feet (04/27/2017)   Atrial fibrillation/flutter (HCC)    a. during admission 01/2019 for PAD angiogram - isolated event after allergic reaction/steroids, monitor planned.   Bradycardia    a. brief episode of nocturnal bradycardia 01/2019 (?NSR with blocked PACs).   CAD (coronary artery disease)    Carotid stenosis 09/24/2011   R ICA patent w/ hx of endarterectomy, stenosis 1-39%;  L ICA patent w/ hx of endarterectomy; external carotids appear patent; see imaging tab for full report   Chest heaviness 02/25/2009   PVCs, atrial bigeminy   CKD (chronic kidney disease), stage III (HCC)    Claudication 06/17/2011   LE doppler - bilateral ABIs normal values at rest; R CIA >50% diameter reduction L CIA 0-49% reduction; bilateral SFAs mild/mod mixed density plaque throughout suggesting 50-69%  diameter reduction   Contrast media allergy    GERD (gastroesophageal reflux disease)    Hyperlipidemia    Hypertension 02/13/2010   echo - EF >55%; mild mitral annular calcification; mild aortic valve sclerosis   NSTEMI (non-ST elevated myocardial infarction) (HCC) 04/26/2017   thelbert 04/27/2017   PAD (peripheral artery disease)    a. s/p LLE intervention on 01/2019.   RBBB (right bundle Josceline Chenard block) 02/13/2010   R/P MV - EF 67%; normal perfusion all regions; no significant wall abnormalties noted   Tobacco abuse     Past Surgical History:  Procedure Laterality Date   ABDOMINAL AORTOGRAM W/LOWER EXTREMITY Bilateral 01/12/2019   Procedure: ABDOMINAL AORTOGRAM W/LOWER EXTREMITY;  Surgeon: Court Dorn PARAS, MD;  Location: [facility] INVASIVE CV [facility];  Service: Cardiovascular;  Laterality: Bilateral;   APPENDECTOMY     ARTERIAL BYPASS SURGRY     pt unaware of this OR on 04/27/2017   BACK SURGERY     CAROTID ENDARTERECTOMY Right 07/15/2009   Right CEA   CAROTID ENDARTERECTOMY Left 12/13/2007   Left CEA   CORONARY ANGIOPLASTY WITH STENT PLACEMENT  04/27/2017   CORONARY STENT INTERVENTION N/A 04/27/2017   Procedure: CORONARY STENT INTERVENTION;  Surgeon: Dann Candyce RAMAN, MD;  Location: [facility] INVASIVE CV [facility];  Service: Cardiovascular;  Laterality: N/A;   LAPAROSCOPIC CHOLECYSTECTOMY     LEFT HEART CATH AND CORONARY ANGIOGRAPHY N/A 04/27/2017  Procedure: LEFT HEART CATH AND CORONARY ANGIOGRAPHY;  Surgeon: Dann Candyce RAMAN, MD;  Location: [facility] INVASIVE CV LAB;  Service: Cardiovascular;  Laterality: N/A;   LUMBAR DISC SURGERY     PERIPHERAL VASCULAR ATHERECTOMY Left 01/12/2019   Procedure: PERIPHERAL VASCULAR ATHERECTOMY;  Surgeon: Court Dorn PARAS, MD;  Location: [facility] INVASIVE CV [facility];  Service: Cardiovascular;  Laterality: Left;  SFA   TONSILLECTOMY         Scheduled Meds:  atropine        atropine        atropine        fentaNYL        fentaNYL  (SUBLIMAZE ) injection  100 mcg Intravenous Once    Continuous Infusions:  DOPamine  Stopped (May 23, 2024 0955)   metronidazole      norepinephrine  (LEVOPHED ) Adult infusion 14 mcg/min (2024-05-23 1032)   vancomycin      PRN Meds: atropine , atropine , atropine , fentaNYL   Allergies:   Allergies[1]  Social History:   Social History   Socioeconomic History   Marital status: Married    Spouse name: Not on file   Number of children: Not on file   Years of education: Not on file   Highest education level: Not on file  Occupational History   Not on file  Tobacco Use   Smoking status: Some Days    Current packs/day: 0.00    Average packs/day: 0.3 packs/day for 65.0 years (16.3 ttl pk-yrs)    Types: Cigarettes    Start date: 06/14/1952    Last attempt to quit: 06/14/2017    Years since quitting: 6.9   Smokeless tobacco: Never   Tobacco comments:    smoking a 10 cigarettes a week   Vaping Use   Vaping status: Some Days  Substance and Sexual Activity   Alcohol  use: No   Drug use: No   Sexual activity: Yes  Other Topics Concern   Not on file  Social History Narrative   Not on file   Social Drivers of Health   Tobacco Use: High Risk (05-23-2024)   Patient History    Smoking Tobacco Use: Some Days    Smokeless Tobacco Use: Never    Passive Exposure: Not on file  Financial Resource Strain: Low Risk (04/26/2023)   Received from [facility]   Overall Financial Resource Strain (CARDIA)    Difficulty of Paying Living Expenses: Not hard at all  Food Insecurity: No Food Insecurity (07/05/2023)   Received from [facility]   Epic    Within the past 12 months, you worried that your food would run out before you got the money to buy more.: Never true    Within the past 12 months, the food you bought just didn't last and you didn't have money to get more.: Never true  Transportation Needs: No Transportation Needs (07/06/2023)   Received from [facility] - Transportation    Lack of Transportation (Medical): No    Lack of  Transportation (Non-Medical): No  Physical Activity: Not on file  Stress: No Stress Concern Present (07/05/2023)   Received from [facility] - Occupational Stress Questionnaire    Feeling of Stress : Not at all  Social Connections: Not on file  Intimate Partner Violence: Not At Risk (04/18/2024)   Received from [facility]   HITS    Over the last 12 months how often did your partner scream or curse at you?: Never    Over the last 12 months how often  did your partner threaten you with physical harm?: Never    Over the last 12 months how often did your partner insult you or talk down to you?: Never    Over the last 12 months how often did your partner physically hurt you?: Never  Depression (PHQ2-9): Not on file  Alcohol  Screen: Not on file  Housing: Low Risk (07/05/2023)   Received from [facility]    In the last 12 months, was there a time when you were not able to pay the mortgage or rent on time?: No    In the past 12 months, how many times have you moved where you were living?: 0    At any time in the past 12 months, were you homeless or living in a shelter (including now)?: No  Utilities: Not At Risk (07/05/2023)   Received from [facility] Utilities    Threatened with loss of utilities: No  Health Literacy: Not on file    Family History:    Family History  Problem Relation Age of Onset   Cancer Mother        stomach   Cancer Sister    Diabetes Brother    Cancer Daughter      ROS:  Please see the history of present illness.   All other ROS reviewed and negative.     Physical Exam/Data: Vitals:   2024/06/03 1035 03-Jun-2024 1036 2024/06/03 1038 06/03/24 1039  BP: (!) 105/55 (!) 95/55 (!) 128/59   Pulse:    60  Resp: 20 (!) 21 (!) 21 20  Temp: (!) 95.5 F (35.3 C) (!) 95.5 F (35.3 C) (!) 95.6 F (35.3 C) (!) 95.6 F (35.3 C)  TempSrc:      SpO2:    100%  Weight:      Height:        Intake/Output  Summary (Last 24 hours) at 06-03-24 1057 Last data filed at 06/03/2024 1005 Gross per 24 hour  Intake 1504.09 ml  Output --  Net 1504.09 ml      06/03/2024    7:23 AM 10/09/2022    9:07 AM 08/03/2022    3:29 PM  Last 3 Weights  Weight (lbs) 154 lb 5.2 oz 158 lb 165 lb 3.2 oz  Weight (kg) 70 kg 71.668 kg 74.934 kg     Body mass index is 23.46 kg/m.  General:  intubated, sedated HEENT: normal Neck: no JVD Vascular: No carotid bruits; Distal pulses 2+ bilaterally Cardiac:  brady Lungs:  coarse bilaterally Abd: soft, nontender, no hepatomegaly  Ext: no edema Musculoskeletal:  No deformities, BUE and BLE strength normal and equal Skin: warm and dry  Neuro:  not able to assess, sedated/intubated Psych:  not able to assess, sedated/intubated   Laboratory Data: High Sensitivity Troponin:  No results for input(s): TROPONINIHS in the last 720 hours.  Recent Labs  Lab 06-03-24 0730 06/03/2024 0932  TRNPT 42* 36*      Chemistry Recent Labs  Lab 06-03-24 0730  NA 139  K 4.4  CL 100  CO2 20*  GLUCOSE 222*  BUN 18  CREATININE 1.41*  CALCIUM  8.8*  GFRNONAA 49*  ANIONGAP 19*    Recent Labs  Lab 06-03-24 0730  PROT 6.5  ALBUMIN 3.9  AST 40  ALT 34  ALKPHOS 127*  BILITOT 1.3*   Lipids No results for input(s): CHOL, TRIG, HDL, LABVLDL, LDLCALC, CHOLHDL in the last 168 hours.  Hematology Recent Labs  Lab 05-24-24 0730  WBC 15.8*  RBC 4.44  HGB 13.0  HCT 41.8  MCV 94.1  MCH 29.3  MCHC 31.1  RDW 15.3  PLT 288   Thyroid   Recent Labs  Lab 2024/05/24 0839  TSH 6.080*    BNP Recent Labs  Lab May 24, 2024 0932  PROBNP 5,262.0*    DDimer No results for input(s): DDIMER in the last 168 hours.  Radiology/Studies:  DG Chest Port 1 View Result Date: 05-24-24 EXAM: 1 VIEW(S) XRAY OF THE CHEST May 24, 2024 09:51:00 AM COMPARISON: 05/24/2024 CLINICAL HISTORY: Orogastric tube placement. ICD [phone] Encounter for orogastric tube placement. FINDINGS:  LINES, TUBES AND DEVICES: Enteric tube in place with tip in the stomach. Endotracheal tube in place with tip 2.4 cm above carina. Right central venous catheter in place with tip over the superior cavoatrial junction. LUNGS AND PLEURA: Unchanged appearance of right mid lung perihilar mass-like opacity. Mild interstitial edema is unchanged from the previous exam. Small right pleural effusion. No pneumothorax. The perihilar mass-like opacity may represent a neoplasm, chronic inflammatory/infectious process, or lymphadenopathy. Clinical correlation and further imaging, such as a CT chest, are recommended for better characterization of the mass-like opacity and evaluation of the pleural effusion. HEART AND MEDIASTINUM: No acute abnormality of the cardiac and mediastinal silhouettes. BONES AND SOFT TISSUES: No acute osseous abnormality. IMPRESSION: 1. Enteric (orogastric) tube terminates in the stomach. 2. Endotracheal tube tip projects 2.4 cm above the carina, and right central venous catheter tip projects over the superior cavoatrial junction. 3.  Unchanged mild interstitial edema and small right pleural effusion. 4. Similar appearance of mass-like opacity within the perihilar right mid lung. Electronically signed by: Waddell Calk MD 05-24-2024 10:09 AM EST RP Workstation: [id number]   DG Chest Port 1 View Result Date: May 24, 2024 EXAM: 1 VIEW(S) XRAY OF THE CHEST 2024/05/24 07:37:00 AM COMPARISON: 01/02/2022. CLINICAL HISTORY: Shortness of breath. FINDINGS: LINES, TUBES AND DEVICES: Right Port-A-Cath in place with tip at the cavoatrial junction. LUNGS AND PLEURA: Small right pleural effusion. Prominent interstitial opacities, may represent pulmonary edema or pneumonia. There is a new right hilar mass. No pneumothorax. HEART AND MEDIASTINUM: Cardiomegaly. Calcified aorta. BONES AND SOFT TISSUES: No acute osseous abnormality. IMPRESSION: 1. New right hilar mass. Differential diagnosis includes primary lung malignancy,  metastatic disease, or hilar lymphadenopathy (including lymphoma or granulomatous infection). Recommend contrast-enhanced CT chest for further evaluation. 2. Prominent interstitial opacities, possibly representing pulmonary edema or or atypical infection. 3. Small right pleural effusion. 4. Cardiomegaly. Electronically signed by: Waddell Calk MD 05/24/24 07:45 AM EST RP Workstation: [id number]     Assessment and Plan:  1.Bradycardia - slight R to R variation, looks to be afb with slow VR in setting of septic shock, severe hypothermia - rates did not respond to dopamine  drip, transitioned to levophed  by ER staff and externally paced.  - bradycardia is metabolically driven, would manage acute systemic issues and follow rates. Poor candidate for temp wire given septic shock, stage iv lung cancer. Follow rates with rewarming, management of septic shock.  - he is on beta blocker (per last cards note bystolic 10mg ), clearly hold medication     2. Chronic afib - hold metoprolol , hold eliquis  in case needs invasive procedures in ICU setting - not a strong indication for hep bridging, if prolonged hold on eliquis  could consider, acutely would not start at this time.        3. CAD - CAD with DES to LCX in 2019 - 03/2024 echo [facility]: LVEF 55-60%, no  WMAs   4. Septic shock - probable pulmonary source given symptoms of cough, SOB, hypoxia on presentation. Recent outpatient course of levaquin for pulmonary symptoms. 04/11/24 [facility] PET indicated pnuemonia - WBC 15.8, hypothermic temps as low as 94.5, lactic acid 7.9 - aggressive IVFs. Limtied response to dopamine , bp improved on levophed  at 14 with MAPs >65 - he has a port can use as central access - management per critical care - will check echo to see if any cardiac dysfunction playing a role.    5. Stage IV lung cancer - address overall goals of care in this setting of severe systemic critical illness - from onc notes not curable, progression of  disease by recent PET at [facility]  6.Respiratory failure - vent management per critical care  Signed, Alvan Carrier, MD  24-May-2024 10:57 AM     [1]  Allergies Allergen Reactions   Plavix  [Clopidogrel ]     Causes severe pain in hands with dizziness.    Contrast Media [Iodinated Contrast Media] Itching    Caused itching. Possibly caused SVT vs afib post-op   Doxycycline Rash   "

## 2024-05-14 NOTE — ED Notes (Signed)
"   [employer] Microport tunneled intravenous subcutaneous power chest Port-A-Cath charted in 2024.  "

## 2024-05-14 NOTE — Progress Notes (Signed)
 Patient was compassionately extubated at 16:31 to room air with RN & family at the bedside.

## 2024-05-14 NOTE — ED Notes (Signed)
 1mg  epi given  Verbal order by Dr.Zammit

## 2024-05-14 NOTE — Progress Notes (Addendum)
 Late entry: Approximately 1145 am patient transported ,on the ventilator, to CT and returned to ER#2 without issue.

## 2024-05-14 NOTE — Discharge Instructions (Signed)

## 2024-05-14 NOTE — Progress Notes (Signed)
 Transition of Care [facility]) - Inpatient Brief Assessment   Patient Details  Name: Ralph Dawson MRN: [id number] Date of Birth: [birth year]  Transition of Care [facility]) CM/SW Contact:    Noreen KATHEE Cleotilde ISRAEL Phone Number: 06-06-2024, 11:45 AM   Clinical Narrative:  Inpatient Care Management (ICM) has reviewed patient and no other ICM needs have been identified at this time. We will continue to monitor patient advancement through interdisciplinary progression rounds. If new patient transition needs arise, please place a ICM consult.  Transition of Care Asessment: Insurance and Status: Insurance coverage has been reviewed Patient has primary care physician: No (PCP list added) Home environment has been reviewed: From Home Prior level of function:: Independent Prior/Current Home Services: No current home services Social Drivers of Health Review: SDOH reviewed interventions complete Readmission risk has been reviewed: Yes Transition of care needs: transition of care needs identified, TOC will continue to follow (ICM will watch for any DME needs)

## 2024-05-14 NOTE — Progress Notes (Signed)
 Patient extubated with RT at 1631. Family at bedside. Levophed  turned off and temporary pacing stopped per family wishes.   Patient appears comfortable on current sedation, family at bedside-no needs at this time.   TOD 1810. Family at bedside.

## 2024-05-14 NOTE — ED Notes (Signed)
 Pt refusing c-collar for EMS and staff.

## 2024-05-14 DEATH — deceased

## 2024-05-17 LAB — CULTURE, BLOOD (ROUTINE X 2)
Culture: NO GROWTH
Culture: NO GROWTH
Special Requests: ADEQUATE
Special Requests: ADEQUATE
